# Patient Record
Sex: Female | Born: 1985
Health system: Southern US, Community
[De-identification: ages and names within clinical notes are randomized; demographics above are authoritative.]

## PROBLEM LIST (undated history)

## (undated) ENCOUNTER — Inpatient Hospital Stay (HOSPITAL_COMMUNITY): Payer: Self-pay

## (undated) DIAGNOSIS — Z8619 Personal history of other infectious and parasitic diseases: Secondary | ICD-10-CM

## (undated) DIAGNOSIS — L03213 Periorbital cellulitis: Secondary | ICD-10-CM

## (undated) DIAGNOSIS — I7300 Raynaud's syndrome without gangrene: Secondary | ICD-10-CM

## (undated) DIAGNOSIS — Z98891 History of uterine scar from previous surgery: Secondary | ICD-10-CM

## (undated) DIAGNOSIS — Z8679 Personal history of other diseases of the circulatory system: Secondary | ICD-10-CM

## (undated) DIAGNOSIS — M179 Osteoarthritis of knee, unspecified: Secondary | ICD-10-CM

## (undated) DIAGNOSIS — F419 Anxiety disorder, unspecified: Secondary | ICD-10-CM

## (undated) DIAGNOSIS — G8929 Other chronic pain: Secondary | ICD-10-CM

## (undated) DIAGNOSIS — N920 Excessive and frequent menstruation with regular cycle: Secondary | ICD-10-CM

## (undated) HISTORY — DX: Periorbital cellulitis: L03.213

## (undated) HISTORY — PX: HIP SURGERY: SHX245

## (undated) HISTORY — PX: TONSILLECTOMY: SUR1361

## (undated) HISTORY — DX: Personal history of other infectious and parasitic diseases: Z86.19

## (undated) HISTORY — DX: Anxiety disorder, unspecified: F41.9

## (undated) HISTORY — PX: KNEE SURGERY: SHX244

---
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
## 2007-01-09 ENCOUNTER — Ambulatory Visit: Payer: Self-pay | Admitting: Internal Medicine

## 2007-01-09 DIAGNOSIS — S83509A Sprain of unspecified cruciate ligament of unspecified knee, initial encounter: Secondary | ICD-10-CM

## 2007-01-09 LAB — CONVERTED CEMR LAB
Bilirubin Urine: NEGATIVE
Ketones, urine, test strip: NEGATIVE
pH: 5

## 2007-01-10 ENCOUNTER — Ambulatory Visit: Payer: Self-pay | Admitting: Internal Medicine

## 2007-01-10 ENCOUNTER — Encounter: Payer: Self-pay | Admitting: Internal Medicine

## 2007-01-10 LAB — CONVERTED CEMR LAB

## 2007-01-18 ENCOUNTER — Telehealth: Payer: Self-pay | Admitting: Internal Medicine

## 2007-02-20 ENCOUNTER — Telehealth: Payer: Self-pay | Admitting: Internal Medicine

## 2007-09-18 ENCOUNTER — Ambulatory Visit: Payer: Self-pay | Admitting: Internal Medicine

## 2007-09-18 LAB — CONVERTED CEMR LAB
ALT: 16 units/L (ref 0–35)
AST: 20 units/L (ref 0–37)
Alkaline Phosphatase: 47 units/L (ref 39–117)
Amylase: 131 units/L (ref 27–131)
Basophils Absolute: 0 10*3/uL (ref 0.0–0.1)
Basophils Relative: 0.6 % (ref 0.0–1.0)
Bilirubin Urine: NEGATIVE
Bilirubin, Direct: 0.1 mg/dL (ref 0.0–0.3)
Chloride: 106 meq/L (ref 96–112)
Eosinophils Absolute: 0.1 10*3/uL (ref 0.0–0.6)
Eosinophils Relative: 1.5 % (ref 0.0–5.0)
GFR calc Af Amer: 136 mL/min
Glucose, Urine, Semiquant: NEGATIVE
HCT: 40.4 % (ref 36.0–46.0)
Lymphocytes Relative: 32.8 % (ref 12.0–46.0)
MCHC: 33.3 g/dL (ref 30.0–36.0)
Monocytes Absolute: 0.5 10*3/uL (ref 0.2–0.7)
Neutro Abs: 4.6 10*3/uL (ref 1.4–7.7)
Neutrophils Relative %: 58.5 % (ref 43.0–77.0)
Nitrite: NEGATIVE
Protein, U semiquant: NEGATIVE
Sodium: 140 meq/L (ref 135–145)
Specific Gravity, Urine: <1.005
WBC: 7.8 10*3/uL (ref 4.5–10.5)
pH: 8

## 2007-09-19 ENCOUNTER — Encounter: Payer: Self-pay | Admitting: Internal Medicine

## 2007-09-21 ENCOUNTER — Encounter: Admission: RE | Admit: 2007-09-21 | Discharge: 2007-09-21 | Payer: Self-pay | Admitting: Internal Medicine

## 2007-09-24 ENCOUNTER — Telehealth (INDEPENDENT_AMBULATORY_CARE_PROVIDER_SITE_OTHER): Payer: Self-pay | Admitting: *Deleted

## 2008-04-14 ENCOUNTER — Ambulatory Visit: Payer: Self-pay | Admitting: Internal Medicine

## 2008-04-14 DIAGNOSIS — R51 Headache: Secondary | ICD-10-CM

## 2008-04-14 DIAGNOSIS — R519 Headache, unspecified: Secondary | ICD-10-CM | POA: Insufficient documentation

## 2008-04-21 LAB — CONVERTED CEMR LAB
BUN: 11 mg/dL (ref 6–23)
CO2: 28 meq/L (ref 19–32)
Calcium: 9.2 mg/dL (ref 8.4–10.5)
Chloride: 105 meq/L (ref 96–112)
Creatinine, Ser: 0.9 mg/dL (ref 0.4–1.2)
Eosinophils Relative: 2.2 % (ref 0.0–5.0)
GFR calc Af Amer: 101 mL/min
GFR calc non Af Amer: 83 mL/min
Glucose, Bld: 83 mg/dL (ref 70–99)
Hemoglobin: 13.4 g/dL (ref 12.0–15.0)
Lymphocytes Relative: 33.9 % (ref 12.0–46.0)
Neutrophils Relative %: 59.7 % (ref 43.0–77.0)
RBC: 4.5 M/uL (ref 3.87–5.11)
RDW: 11.7 % (ref 11.5–14.6)
Sed Rate: 18 mm/hr (ref 0–22)
Sodium: 140 meq/L (ref 135–145)
WBC: 9 10*3/uL (ref 4.5–10.5)

## 2008-07-16 ENCOUNTER — Ambulatory Visit: Payer: Self-pay | Admitting: Family Medicine

## 2008-07-16 DIAGNOSIS — J019 Acute sinusitis, unspecified: Secondary | ICD-10-CM | POA: Insufficient documentation

## 2008-09-18 ENCOUNTER — Ambulatory Visit (HOSPITAL_COMMUNITY): Admission: RE | Admit: 2008-09-18 | Discharge: 2008-09-18 | Payer: Self-pay | Admitting: Orthopedic Surgery

## 2008-10-02 ENCOUNTER — Ambulatory Visit (HOSPITAL_BASED_OUTPATIENT_CLINIC_OR_DEPARTMENT_OTHER): Admission: RE | Admit: 2008-10-02 | Discharge: 2008-10-03 | Payer: Self-pay | Admitting: Orthopedic Surgery

## 2008-10-02 HISTORY — PX: KNEE ARTHROSCOPY W/ MENISCECTOMY: SHX1879

## 2009-01-15 ENCOUNTER — Ambulatory Visit: Payer: Self-pay | Admitting: Family Medicine

## 2009-01-15 ENCOUNTER — Encounter (INDEPENDENT_AMBULATORY_CARE_PROVIDER_SITE_OTHER): Payer: Self-pay | Admitting: *Deleted

## 2009-01-15 DIAGNOSIS — F988 Other specified behavioral and emotional disorders with onset usually occurring in childhood and adolescence: Secondary | ICD-10-CM

## 2009-01-15 DIAGNOSIS — R109 Unspecified abdominal pain: Secondary | ICD-10-CM | POA: Insufficient documentation

## 2009-01-23 ENCOUNTER — Emergency Department (HOSPITAL_COMMUNITY): Admission: EM | Admit: 2009-01-23 | Discharge: 2009-01-23 | Payer: Self-pay | Admitting: Emergency Medicine

## 2009-06-08 ENCOUNTER — Ambulatory Visit: Payer: Self-pay | Admitting: Internal Medicine

## 2009-06-08 DIAGNOSIS — F411 Generalized anxiety disorder: Secondary | ICD-10-CM | POA: Insufficient documentation

## 2009-06-08 DIAGNOSIS — N390 Urinary tract infection, site not specified: Secondary | ICD-10-CM

## 2009-06-08 LAB — CONVERTED CEMR LAB
Bilirubin Urine: NEGATIVE
Nitrite: NEGATIVE
Specific Gravity, Urine: 1.015

## 2009-07-01 ENCOUNTER — Ambulatory Visit: Payer: Self-pay | Admitting: Family Medicine

## 2009-08-03 ENCOUNTER — Ambulatory Visit: Payer: Self-pay | Admitting: Family Medicine

## 2009-08-28 ENCOUNTER — Telehealth (INDEPENDENT_AMBULATORY_CARE_PROVIDER_SITE_OTHER): Payer: Self-pay | Admitting: *Deleted

## 2009-09-29 ENCOUNTER — Telehealth (INDEPENDENT_AMBULATORY_CARE_PROVIDER_SITE_OTHER): Payer: Self-pay | Admitting: *Deleted

## 2009-10-20 ENCOUNTER — Encounter: Admission: RE | Admit: 2009-10-20 | Discharge: 2009-10-20 | Payer: Self-pay | Admitting: Orthopedic Surgery

## 2009-11-02 ENCOUNTER — Telehealth (INDEPENDENT_AMBULATORY_CARE_PROVIDER_SITE_OTHER): Payer: Self-pay | Admitting: *Deleted

## 2009-12-14 ENCOUNTER — Telehealth (INDEPENDENT_AMBULATORY_CARE_PROVIDER_SITE_OTHER): Payer: Self-pay | Admitting: *Deleted

## 2010-01-05 ENCOUNTER — Telehealth (INDEPENDENT_AMBULATORY_CARE_PROVIDER_SITE_OTHER): Payer: Self-pay | Admitting: *Deleted

## 2010-02-18 ENCOUNTER — Telehealth: Payer: Self-pay | Admitting: Family Medicine

## 2010-02-23 ENCOUNTER — Telehealth: Payer: Self-pay | Admitting: Family Medicine

## 2010-02-26 ENCOUNTER — Ambulatory Visit: Payer: Self-pay | Admitting: Family Medicine

## 2010-02-26 DIAGNOSIS — H6520 Chronic serous otitis media, unspecified ear: Secondary | ICD-10-CM | POA: Insufficient documentation

## 2010-02-26 DIAGNOSIS — I7300 Raynaud's syndrome without gangrene: Secondary | ICD-10-CM | POA: Insufficient documentation

## 2010-03-02 LAB — CONVERTED CEMR LAB
ALT: 14 units/L (ref 0–35)
AST: 16 units/L (ref 0–37)
Albumin: 4.2 g/dL (ref 3.5–5.2)
Alkaline Phosphatase: 54 units/L (ref 39–117)
BUN: 7 mg/dL (ref 6–23)
Creatinine, Ser: 1 mg/dL (ref 0.40–1.20)
Eosinophils Absolute: 0.1 10*3/uL (ref 0.0–0.7)
Glucose, Bld: 108 mg/dL — ABNORMAL HIGH (ref 70–99)
Indirect Bilirubin: 0.3 mg/dL (ref 0.0–0.9)
Lymphocytes Relative: 40 % (ref 12–46)
MCHC: 32.9 g/dL (ref 30.0–36.0)
MCV: 87.4 fL (ref 78.0–100.0)
Monocytes Absolute: 0.6 10*3/uL (ref 0.1–1.0)
Neutrophils Relative %: 51 % (ref 43–77)
Platelets: 264 10*3/uL (ref 150–400)
RDW: 12.3 % (ref 11.5–15.5)
Rhuematoid fact SerPl-aCnc: <20 intl units/mL (ref 0–20)
Sed Rate: 6 mm/hr (ref 0–22)

## 2010-03-23 ENCOUNTER — Telehealth: Payer: Self-pay | Admitting: Family Medicine

## 2010-04-23 ENCOUNTER — Telehealth (INDEPENDENT_AMBULATORY_CARE_PROVIDER_SITE_OTHER): Payer: Self-pay | Admitting: *Deleted

## 2010-05-21 ENCOUNTER — Telehealth (INDEPENDENT_AMBULATORY_CARE_PROVIDER_SITE_OTHER): Payer: Self-pay | Admitting: *Deleted

## 2010-06-22 ENCOUNTER — Ambulatory Visit: Payer: Self-pay | Admitting: Family Medicine

## 2010-06-22 DIAGNOSIS — J069 Acute upper respiratory infection, unspecified: Secondary | ICD-10-CM | POA: Insufficient documentation

## 2010-06-24 ENCOUNTER — Telehealth: Payer: Self-pay | Admitting: Family Medicine

## 2010-07-27 ENCOUNTER — Telehealth (INDEPENDENT_AMBULATORY_CARE_PROVIDER_SITE_OTHER): Payer: Self-pay | Admitting: *Deleted

## 2010-08-12 NOTE — Progress Notes (Signed)
SummaryElmyra Stokes  REFILL THIS AFTERNOON  Phone Note Call from Patient Call back at Work Phone [phone]   Caller: Patient Summary of Call: NEEDS REFILL FOR VYVANCE---ONLY HAS ONE PILL LEFT---NEEDS PRESCRIPTION THIS AFTERNOON Initial call taken by: Jerolyn Shin,  April 23, 2010 8:58 AM  Follow-up for Phone Call        pt aware rx ready for pick-up.....................Marland KitchenFelecia Deloach CMA  April 23, 2010 9:05 AM     Prescriptions: VYVANSE 70 MG CAPS (LISDEXAMFETAMINE DIMESYLATE) 1 by mouth once daily  #30 x 0   Entered by:   Jeremy Johann CMA   Authorized by:   Neena Rhymes MD   Signed by:   Jeremy Johann CMA on 04/23/2010   Method used:   Print then Give to Patient   RxID:   [id number]

## 2010-08-12 NOTE — Progress Notes (Signed)
Summary: refill   Phone Note Refill Request   Refills Requested: Medication #1:  VYVANSE 70 MG CAPS 1 by mouth once daily  Note place on rx 6 month OV due............Marland KitchenFelecia Deloach CMA  May 21, 2010 11:02 AM      Prescriptions: VYVANSE 70 MG CAPS (LISDEXAMFETAMINE DIMESYLATE) 1 by mouth once daily  #30 x 0   Entered by:   Jeremy Johann CMA   Authorized by:   Neena Rhymes MD   Signed by:   Jeremy Johann CMA on 05/21/2010   Method used:   Print then Give to Patient   RxID:   [id number]

## 2010-08-12 NOTE — Assessment & Plan Note (Signed)
Summary: follow up med/cbs   Vital Signs:  Patient profile:   25 year old female Height:      62 inches Weight:      123.2 pounds BMI:     22.62 Temp:     99.3 degrees F oral Pulse rate:   72 / minute Pulse rhythm:   regular BP sitting:   110 / 72  (right arm) Cuff size:   regular  Vitals Entered By: Almeta Monas CMA Duncan Dull) (June 22, 2010 10:04 AM) CC: Med check F/u, URI symptoms   History of Present Illness: Pt here for refill vyvanse.  Pt is doing well in Law school.   No complaints except pt is suffering from bad cold.         This is a 25 year old woman who presents with URI symptoms.  The patient complains of nasal congestion and clear nasal discharge, but denies purulent nasal discharge, sore throat, dry cough, productive cough, earache, and sick contacts.  The patient denies fever, low-grade fever (<100.5 degrees), fever of 100.5-103 degrees, fever of 103.1-104 degrees, fever to >104 degrees, stiff neck, dyspnea, wheezing, rash, vomiting, diarrhea, use of an antipyretic, and response to antipyretic.  The patient also reports itchy throat.  The patient denies itchy watery eyes, sneezing, seasonal symptoms, response to antihistamine, headache, muscle aches, and severe fatigue.  The patient denies the following risk factors for Strep sinusitis: unilateral facial pain, unilateral nasal discharge, poor response to decongestant, double sickening, tooth pain, Strep exposure, tender adenopathy, and absence of cough.    Current Medications (verified): 1)  Loestrin 1/20 (21)   Tabs (Norethindrone Acet-Ethinyl Est Tabs) .... Take One Tablet Daily 2)  Alprazolam 0.5 Mg Tabs (Alprazolam) .... 1/2 To 1 By Mouth Once Daily As Needed Anxiety 3)  Vyvanse 70 Mg Caps (Lisdexamfetamine Dimesylate) .Marland Kitchen.. 1 By Mouth Once Daily 4)  Claritin 10 Mg Tabs (Loratadine) .Marland Kitchen.. 1 By Mouth Once Daily As Needed 5)  Sudafed 30 Mg Tabs (Pseudoephedrine Hcl) .Marland Kitchen.. 1 By Mouth Once Daily As Needed 6)  Nasonex 50  Mcg/act Susp (Mometasone Furoate) .... 2 Sprays Each Nostril Once Daily 7)  Astepro 0.15 % Soln (Azelastine Hcl) .... 2 Sprays Each Nostril Once Daily  Allergies (verified): No Known Drug Allergies  Past History:  Past Medical History: Last updated: 06/08/2009 G0P0 Headache (migrainex, Dx 2006) Anxiety  Past Surgical History: Last updated: 01/15/2009 Tonsillectomy Knee surgery L X 6- Dr Eulah Pont  Family History: Last updated: 01/15/2009 mother had problems with gallbladder, ADD  Social History: Last updated: 01/15/2009 Single Occupation: just graduated from [employer], going to [employer]  Risk Factors: Alcohol Use: 0 (01/15/2009) Exercise: no (01/15/2009)  Risk Factors: Smoking Status: never (01/15/2009) Passive Smoke Exposure: no (01/09/2007)  Family History: Reviewed history from 01/15/2009 and no changes required. mother had problems with gallbladder, ADD  Social History: Reviewed history from 01/15/2009 and no changes required. Single Occupation: just graduated from [employer], going to [employer]  Review of Systems      See HPI  Physical Exam  General:  Well-developed,well-nourished,in no acute distress; alert,appropriate and cooperative throughout examination Ears:  External ear exam shows no significant lesions or deformities.  Otoscopic examination reveals clear canals, tympanic membranes are intact bilaterally without bulging, retraction, inflammation or discharge. Hearing is grossly normal bilaterally. Nose:  no external deformity, mucosal erythema, and mucosal edema.   Mouth:  postnasal drip.   Neck:  No deformities, masses, or tenderness noted. Lungs:  Normal  respiratory effort, chest expands symmetrically. Lungs are clear to auscultation, no crackles or wheezes. Heart:  Normal rate and regular rhythm. S1 and S2 normal without gallop, murmur, click, rub or other extra sounds. Psych:  Cognition and judgment appear intact. Alert and  cooperative with normal attention span and concentration. No apparent delusions, illusions, hallucinations   Impression & Recommendations:  Problem # 1:  ADD (ICD-314.00) Assessment Improved refill vyvanse rto 6 months  Problem # 2:  URI (ICD-465.9) Assessment: New  Her updated medication list for this problem includes:    Claritin 10 Mg Tabs (Loratadine) .Marland Kitchen... 1 by mouth once daily as needed    con't mucinex use nasonex and astepro   Instructed on symptomatic treatment. Call if symptoms persist or worsen.   Complete Medication List: 1)  Loestrin 1/20 (21) Tabs (Norethindrone acet-ethinyl est tabs) .... Take one tablet daily 2)  Alprazolam 0.5 Mg Tabs (Alprazolam) .... 1/2 to 1 by mouth once daily as needed anxiety 3)  Vyvanse 70 Mg Caps (Lisdexamfetamine dimesylate) .Marland Kitchen.. 1 by mouth once daily 4)  Claritin 10 Mg Tabs (Loratadine) .Marland Kitchen.. 1 by mouth once daily as needed 5)  Sudafed 30 Mg Tabs (Pseudoephedrine hcl) .Marland Kitchen.. 1 by mouth once daily as needed 6)  Nasonex 50 Mcg/act Susp (Mometasone furoate) .... 2 sprays each nostril once daily 7)  Astepro 0.15 % Soln (Azelastine hcl) .... 2 sprays each nostril once daily  Patient Instructions: 1)  Please schedule a follow-up appointment in 6 months .  2)  Get plenty of rest, drink lots of clear liquids, and use Tylenol or Ibuprofen for fever and comfort. Return in 7-10 days if you're not better: sooner if you'er feeling worse.  Prescriptions: VYVANSE 70 MG CAPS (LISDEXAMFETAMINE DIMESYLATE) 1 by mouth once daily  #30 x 0   Entered and Authorized by:   Loreen Freud DO   Signed by:   Loreen Freud DO on 06/22/2010   Method used:   Print then Give to Patient   RxID:   [id number]    Orders Added: 1)  Est. Patient Level III [14782]

## 2010-08-12 NOTE — Progress Notes (Signed)
Summary: symptom worse  Phone Note Call from Patient Call back at Work Phone [phone]   Caller: Patient Summary of Call:  Symptoms not resolving with the suggested meds. Pt now c/o increase congestion in head and chest and coughing up green mucous. Pt use [facility].Pls advise..........Marland KitchenFelecia Deloach CMA  June 24, 2010 8:51 AM   Follow-up for Phone Call        augmentin 875mg   1 by mouth two times a day x10 days  Follow-up by: Loreen Freud DO,  June 24, 2010 9:06 AM  Additional Follow-up for Phone Call Additional follow up Details #1::        pt aware.... Almeta Monas CMA Duncan Dull)  June 24, 2010 9:16 AM     New/Updated Medications: AUGMENTIN 875-125 MG TABS (AMOXICILLIN-POT CLAVULANATE) 1 by mouth two times a day x10 days Prescriptions: AUGMENTIN 875-125 MG TABS (AMOXICILLIN-POT CLAVULANATE) 1 by mouth two times a day x10 days  #20 x 0   Entered by:   Almeta Monas CMA (AAMA)   Authorized by:   Loreen Freud DO   Signed by:   Almeta Monas CMA (AAMA) on 06/24/2010   Method used:   Electronically to        [facility]       [address]       [place], [place]  [address]       Ph: [phone]       Fax: [phone]   RxID:   [id number]

## 2010-08-12 NOTE — Assessment & Plan Note (Signed)
Summary: fu on meds/kdc   Vital Signs:  Patient profile:   25 year old female Weight:      133.38 pounds Temp:     98.3 degrees F oral Pulse rate:   82 / minute Pulse rhythm:   regular BP sitting:   122 / 80  (left arm) Cuff size:   regular  Vitals Entered By: Army Fossa CMA (August 03, 2009 4:19 PM) CC: follow up on meds   History of Present Illness: Pt here f/u add.  Pt doing well with 60 mg ---no complaints.  She is doing well in class with concentrating and tests.    Allergies (verified): No Known Drug Allergies  Past History:  Past medical, surgical, family and social histories (including risk factors) reviewed for relevance to current acute and chronic problems.  Past Medical History: Reviewed history from 06/08/2009 and no changes required. G0P0 Headache (migrainex, Dx 2006) Anxiety  Past Surgical History: Reviewed history from 01/15/2009 and no changes required. Tonsillectomy Knee surgery L X 6- Dr Eulah Pont  Family History: Reviewed history from 01/15/2009 and no changes required. mother had problems with gallbladder, ADD  Social History: Reviewed history from 01/15/2009 and no changes required. Single Occupation: just graduated from [employer], going to [employer]  Review of Systems      See HPI  Physical Exam  General:  Well-developed,well-nourished,in no acute distress; alert,appropriate and cooperative throughout examination Lungs:  Normal respiratory effort, chest expands symmetrically. Lungs are clear to auscultation, no crackles or wheezes. Heart:  Normal rate and regular rhythm. S1 and S2 normal without gallop, murmur, click, rub or other extra sounds. Psych:  Cognition and judgment appear intact. Alert and cooperative with normal attention span and concentration. No apparent delusions, illusions, hallucinations   Impression & Recommendations:  Problem # 1:  ADD (ICD-314.00) vyvanse 60 mg daily rto 6 months or sooner as needed    Complete Medication List: 1)  Loestrin 1/20 (21) Tabs (Norethindrone acet-ethinyl est tabs) .... Take one tablet daily 2)  Alprazolam 0.5 Mg Tabs (Alprazolam) .... 1/2 to 1 by mouth once daily as needed anxiety 3)  Vyvanse 60 Mg Caps (Lisdexamfetamine dimesylate) .Marland Kitchen.. 1 by mouth qam Prescriptions: VYVANSE 60 MG CAPS (LISDEXAMFETAMINE DIMESYLATE) 1 by mouth qam  #30 x 0   Entered and Authorized by:   Loreen Freud DO   Signed by:   Loreen Freud DO on 08/03/2009   Method used:   Print then Give to Patient   RxID:   [id number]

## 2010-08-12 NOTE — Progress Notes (Signed)
Summary: Vyvanse refill  Phone Note Refill Request   Patient father stopped by today to pick up refill from last week. It is time for refill, but appears to have not been printed.      Prescriptions: VYVANSE 60 MG CAPS (LISDEXAMFETAMINE DIMESYLATE) 1 by mouth qam  #30 x 0   Entered by:   Lucious Groves CMA   Authorized by:   Neena Rhymes MD   Signed by:   Lucious Groves CMA on 02/23/2010   Method used:   Print then Give to Patient   RxID:   [id number]

## 2010-08-12 NOTE — Progress Notes (Signed)
  Phone Note Refill Request   Refills Requested: Medication #1:  VYVANSE 60 MG CAPS 1 by mouth qam.  Follow-up for Phone Call        Pt is aware rx will be ready monday. Army Fossa CMA  August 28, 2009 4:18 PM     Prescriptions: VYVANSE 60 MG CAPS (LISDEXAMFETAMINE DIMESYLATE) 1 by mouth qam  #30 x 0   Entered by:   Army Fossa CMA   Authorized by:   Loreen Freud DO   Signed by:   Army Fossa CMA on 08/28/2009   Method used:   Printed then faxed to ...       [facility]       [address]       [place], [place]  [address]       Ph: [phone]       Fax: [phone]   RxID:   [id number]

## 2010-08-12 NOTE — Progress Notes (Signed)
Summary: vyvanse refill   Phone Note Refill Request Call back at Work Phone [phone] Message from:  Patient  Refills Requested: Medication #1:  VYVANSE 60 MG CAPS 1 by mouth qam.   Dosage confirmed as above?Dosage Confirmed   Supply Requested: 1 month   Last Refilled: 12/14/2009 PT IS REQUESTING RX EARLY. SHE WILL BE OUT OF TOWN FRIDAY AND WILL NOT BE BACK UNTIL JULY 12TH.   Method Requested: Pick up at Office Next Appointment Scheduled: NONE Initial call taken by: Lavell Islam,  January 05, 2010 10:35 AM  Follow-up for Phone Call        patient aware prescription ready for pick up.....Marland KitchenMarland KitchenDoristine Devoid  January 05, 2010 12:28 PM     Prescriptions: VYVANSE 60 MG CAPS (LISDEXAMFETAMINE DIMESYLATE) 1 by mouth qam  #30 x 0   Entered by:   Doristine Devoid   Authorized by:   Neena Rhymes MD   Signed by:   Doristine Devoid on 01/05/2010   Method used:   Print then Give to Patient   RxID:   [id number]

## 2010-08-12 NOTE — Progress Notes (Signed)
Summary: Vyvanse refill  Phone Note Refill Request Call back at Work Phone [phone] Message from:  Patient on July 27, 2010 11:39 AM  Refills Requested: Medication #1:  VYVANSE 70 MG CAPS 1 by mouth once daily Patient needs Vyvansel prescription refilled.  Advised patient that it will be ready in 24 hours for pickup unless someone calls them  Next Appointment Scheduled: none Initial call taken by: Jerolyn Shin,  July 27, 2010 11:39 AM    Prescriptions: VYVANSE 70 MG CAPS (LISDEXAMFETAMINE DIMESYLATE) 1 by mouth once daily  #30 x 0   Entered by:   Doristine Devoid CMA   Authorized by:   Neena Rhymes MD   Signed by:   Doristine Devoid CMA on 07/27/2010   Method used:   Print then Give to Patient   RxID:   [id number]

## 2010-08-12 NOTE — Assessment & Plan Note (Signed)
Summary: EAR PROBLEM/KN   Vital Signs:  Patient profile:   25 year old female Height:      62 inches Weight:      121 pounds Temp:     97.5 degrees F oral Pulse rate:   80 / minute BP sitting:   120 / 80  (left arm)  Vitals Entered By: Jeremy Johann CMA (February 26, 2010 4:06 PM) CC: trouble hearing, circulation issue, Ear pain   History of Present Illness: Pt here c/o feet and hands turning blue and going numb for a few years but has progressively gotten worse since april. No muscle aches or joint pains.  No sob or cp.  It is worse when cold butcan happen anytime---if she props her legs up and wraps up in a blanket they get better.       Ear Pain      This is a 25 year old woman who presents with Ear pain.  The symptoms began 4 weeks ago.  The patient complains of sensation of fullness, hearing loss, and nasal discharge, but denies ear discharge, tinnitus, fever, and jaw click.  The pain is located in both ears.  The pain is described as constant.  Associated symptoms include headache, popping or crackling sounds, and pressure.  Prior treatment has included decongestants and claritin with no relief.    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  Current Medications (verified): 1)  Loestrin 1/20 (21)   Tabs (Norethindrone Acet-Ethinyl Est Tabs) .... Take One Tablet Daily 2)  Alprazolam 0.5 Mg Tabs (Alprazolam) .... 1/2 To 1 By Mouth Once Daily As Needed Anxiety 3)  Vyvanse 60 Mg Caps (Lisdexamfetamine Dimesylate) .Marland Kitchen.. 1 By Mouth Qam  Allergies (verified): No Known Drug Allergies  Past History:  Past Medical History: Last updated: 06/08/2009 G0P0 Headache (migrainex, Dx 2006) Anxiety  Past Surgical History: Last updated: 01/15/2009 Tonsillectomy Knee surgery L X 6- Dr Eulah Pont  Family History: Last updated: 01/15/2009 mother had problems with gallbladder, ADD  Social History: Last updated: 01/15/2009 Single Occupation: just graduated from [employer], going to [employer]  Risk Factors: Alcohol Use: 0 (01/15/2009) Exercise: no (01/15/2009)  Risk Factors: Smoking Status: never (01/15/2009) Passive Smoke Exposure: no (01/09/2007)  Family History: Reviewed history from 01/15/2009 and no changes required. mother had problems with gallbladder, ADD  Social History: Reviewed history from 01/15/2009 and no changes required. Single Occupation: just graduated from [employer], going to [employer]  Review of Systems      See HPI  Physical Exam  General:  Well-developed,well-nourished,in no acute distress; alert,appropriate and cooperative throughout examination Ears:  External ear exam shows no significant lesions or deformities.  Otoscopic examination reveals clear canals, tympanic membranes are intact bilaterally without bulging, retraction, inflammation or discharge.  Nose:  no external deformity and mucosal erythema.   Mouth:  Oral mucosa and oropharynx without lesions or exudates.  Teeth in good repair. Neck:  No deformities, masses, or tenderness noted. Lungs:  Normal respiratory effort, chest expands symmetrically. Lungs are clear to auscultation, no crackles or wheezes. Heart:  normal rate and no murmur.   Msk:  No deformity or scoliosis noted of thoracic or lumbar spine.   Pulses:  R radial normal, R posterior tibial normal, R dorsalis pedis normal, L radial normal, L posterior tibial normal, and L dorsalis pedis normal.   Extremities:  No clubbing, cyanosis, edema, or deformity noted with normal full range of motion  of all joints.   Skin:  Intact without suspicious lesions or rashes Cervical Nodes:  No lymphadenopathy noted Psych:  Cognition and judgment appear intact. Alert and cooperative with normal attention span and concentration. 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) keep hands and feet warm consider  rheum referral if any pain develops rto Orders: Venipuncture ([address]) TLB-B12 + Folate Pnl (60454_09811-B14/NWG) TLB-BMP (Basic Metabolic Panel-BMET) (80048-METABOL) TLB-CBC Platelet - w/Differential (85025-CBCD) TLB-Hepatic/Liver Function Pnl (80076-HEPATIC) TLB-TSH (Thyroid Stimulating Hormone) (84443-TSH) T-Antinuclear Antib (ANA) (95621-30865) Specimen Handling (78469)  Problem # 2:  OTITIS MEDIA, SEROUS, CHRONIC (ICD-381.10) claritin sudafed veramyst if no better in 10-14 days----consider prednisone taper vs ENT  Problem # 3:  ADD (ICD-314.00) cont vyvanse  Complete Medication List: 1)  Loestrin 1/20 (21) Tabs (Norethindrone acet-ethinyl est tabs) .... Take one tablet daily 2)  Alprazolam 0.5 Mg Tabs (Alprazolam) .... 1/2 to 1 by mouth once daily as needed anxiety 3)  Vyvanse 60 Mg Caps (Lisdexamfetamine dimesylate) .Marland Kitchen.. 1 by mouth qam 4)  Veramyst 27.5 Mcg/spray Susp (Fluticasone furoate) .... 2 sprays each nostril once daily 5)  Claritin 10 Mg Tabs (Loratadine) .Marland Kitchen.. 1 by mouth once daily as needed 6)  Sudafed 30 Mg Tabs (Pseudoephedrine hcl) .Marland Kitchen.. 1 by mouth once daily as needed  Patient Instructions: 1)  If pain develops rto  2)  rto 6 months for add f/u

## 2010-08-12 NOTE — Progress Notes (Signed)
Summary: increase dose  Phone Note Refill Request Call back at Work Phone [phone] Message from:  Patient on March 23, 2010 9:51 AM  Refills Requested: Medication #1:  VYVANSE 60 MG CAPS 1 by mouth qam patient would like to know if she could dose increase she has more classes and they are longer  Initial call taken by: Doristine Devoid CMA,  March 23, 2010 9:51 AM  Follow-up for Phone Call        vyvanse 70 mg #30 1 by mouth once daily ---that is the highest dose Follow-up by: Loreen Freud DO,  March 23, 2010 11:04 AM  Additional Follow-up for Phone Call Additional follow up Details #1::        rx printed. Pt aware Additional Follow-up by: Almeta Monas CMA Duncan Dull),  March 23, 2010 11:55 AM    New/Updated Medications: VYVANSE 70 MG CAPS (LISDEXAMFETAMINE DIMESYLATE) 1 by mouth once daily Prescriptions: VYVANSE 70 MG CAPS (LISDEXAMFETAMINE DIMESYLATE) 1 by mouth once daily  #30 x 0   Entered by:   Almeta Monas CMA (AAMA)   Authorized by:   Loreen Freud DO   Signed by:   Almeta Monas CMA (AAMA) on 03/23/2010   Method used:   Print then Give to Patient   RxID:   [id number]

## 2010-08-12 NOTE — Progress Notes (Signed)
Summary: refill  Phone Note Refill Request Call back at Work Phone [phone] Message from:  Patient on December 14, 2009 10:31 AM  Refills Requested: Medication #1:  VYVANSE 60 MG CAPS 1 by mouth qam. CALL PATIENT WHEN READY   Method Requested: Pick up at Office Initial call taken by: Okey Regal Spring,  December 14, 2009 10:31 AM  Follow-up for Phone Call        left message for pt that rx was ready. Army Fossa CMA  December 14, 2009 11:46 AM     Prescriptions: VYVANSE 60 MG CAPS (LISDEXAMFETAMINE DIMESYLATE) 1 by mouth qam  #30 x 0   Entered by:   Army Fossa CMA   Authorized by:   Loreen Freud DO   Signed by:   Army Fossa CMA on 12/14/2009   Method used:   Print then Give to Patient   RxID:   [id number]

## 2010-08-12 NOTE — Progress Notes (Signed)
Summary: REFILL REQUEST  Phone Note Refill Request Message from:  Patient on February 18, 2010 12:46 PM  Refills Requested: Medication #1:  VYVANSE 60 MG CAPS 1 by mouth qam.   Dosage confirmed as above?Dosage Confirmed   Supply Requested: 1 month PT AWARE READY FOR PICKUP WITHIN NEXT 24 HRS   Method Requested: Pick up at Office Next Appointment Scheduled: 02/26/10 Initial call taken by: Lavell Islam,  February 18, 2010 12:47 PM

## 2010-08-12 NOTE — Progress Notes (Signed)
Summary: refill  Phone Note Refill Request Call back at Work Phone [phone] Message from:  Patient on November 02, 2009 1:59 PM  Refills Requested: Medication #1:  VYVANSE 60 MG CAPS 1 by mouth qam. call patient when ready  Next Appointment Scheduled: none Initial call taken by: Okey Regal Spring,  November 02, 2009 1:59 PM  Follow-up for Phone Call        Pt aware rx is ready. Army Fossa CMA  November 02, 2009 2:04 PM     Prescriptions: VYVANSE 60 MG CAPS (LISDEXAMFETAMINE DIMESYLATE) 1 by mouth qam  #30 x 0   Entered by:   Army Fossa CMA   Authorized by:   Loreen Freud DO   Signed by:   Army Fossa CMA on 11/02/2009   Method used:   Print then Give to Patient   RxID:   [id number]

## 2010-08-12 NOTE — Progress Notes (Signed)
  Phone Note Refill Request   Refills Requested: Medication #1:  VYVANSE 60 MG CAPS 1 by mouth qam.    Prescriptions: VYVANSE 60 MG CAPS (LISDEXAMFETAMINE DIMESYLATE) 1 by mouth qam  #30 x 0   Entered by:   Army Fossa CMA   Authorized by:   Loreen Freud DO   Signed by:   Army Fossa CMA on 09/29/2009   Method used:   Print then Give to Patient   RxID:   [id number]

## 2010-08-23 ENCOUNTER — Telehealth (INDEPENDENT_AMBULATORY_CARE_PROVIDER_SITE_OTHER): Payer: Self-pay | Admitting: *Deleted

## 2010-09-01 NOTE — Progress Notes (Signed)
Summary: refill  Phone Note Refill Request Message from:  Patient  Refills Requested: Medication #1:  VYVANSE 70 MG CAPS 1 by mouth once daily patient will pick up [id number] around noon  Initial call taken by: Okey Regal Spring,  August 23, 2010 4:49 PM    Prescriptions: VYVANSE 70 MG CAPS (LISDEXAMFETAMINE DIMESYLATE) 1 by mouth once daily  #30 x 0   Entered by:   Doristine Devoid CMA   Authorized by:   Neena Rhymes MD   Signed by:   Doristine Devoid CMA on 08/24/2010   Method used:   Print then Give to Patient   RxID:   [id number]

## 2010-09-20 ENCOUNTER — Telehealth: Payer: Self-pay | Admitting: Family Medicine

## 2010-09-24 ENCOUNTER — Telehealth: Payer: Self-pay | Admitting: Family Medicine

## 2010-09-28 NOTE — Progress Notes (Signed)
Summary: Vyvanse refill  Phone Note Refill Request Message from:  Patient on September 20, 2010 2:49 PM  Refills Requested: Medication #1:  VYVANSE 70 MG CAPS 1 by mouth once daily Patient needs Vyvanse prescription refilled.  Advised patient that it will be ready in 24 hours for pickup unless someone calls them  Initial call taken by: Jerolyn Shin,  September 20, 2010 2:49 PM  Follow-up for Phone Call        ok for #30, no refills Follow-up by: Neena Rhymes MD,  September 20, 2010 4:01 PM    Prescriptions: VYVANSE 70 MG CAPS (LISDEXAMFETAMINE DIMESYLATE) 1 by mouth once daily  #30 x 0   Entered by:   Lucious Groves CMA   Authorized by:   Neena Rhymes MD   Signed by:   Lucious Groves CMA on 09/20/2010   Method used:   Print then Give to Patient   RxID:   [id number]

## 2010-10-07 NOTE — Progress Notes (Signed)
Summary: Alprazolam refill  Phone Note Refill Request Call back at Work Phone [phone] Message from:  Patient on September 24, 2010 10:05 AM  Refills Requested: Medication #1:  ALPRAZOLAM 0.5 MG TABS 1/2 to 1 by mouth once daily as needed anxiety   Dosage confirmed as above?Dosage Confirmed [facility],  [address]ani Files, [place]       Next Appointment Scheduled: none Initial call taken by: Jerolyn Shin,  September 24, 2010 10:06 AM  Follow-up for Phone Call        Last filled with [facility] 11/10. Lucious Groves CMA  September 24, 2010 10:18 AM   Additional Follow-up for Phone Call Additional follow up Details #1::        ok for #30, no refills Additional Follow-up by: Neena Rhymes MD,  September 24, 2010 11:48 AM    Additional Follow-up for Phone Call Additional follow up Details #2::    RX was faxed to [facility] on [address]. Left message on voicemail to call back to office. Lucious Groves CMA  September 24, 2010 12:37 PM   No return call, left message on voicemail that prescription was sent to [facility]. Lucious Groves CMA  September 27, 2010 8:50 AM   Prescriptions: ALPRAZOLAM 0.5 MG TABS (ALPRAZOLAM) 1/2 to 1 by mouth once daily as needed anxiety  #30 x 0   Entered by:   Lucious Groves CMA   Authorized by:   Neena Rhymes MD   Signed by:   Lucious Groves CMA on 09/24/2010   Method used:   Printed then faxed to ...       [facility]       [address]       [place], [place]  [address]       Ph: [phone]       Fax: [phone]   RxID:   [id number]

## 2010-10-17 LAB — COMPREHENSIVE METABOLIC PANEL
AST: 22 U/L (ref 0–37)
BUN: 6 mg/dL (ref 6–23)
Creatinine, Ser: 0.84 mg/dL (ref 0.4–1.2)
GFR calc non Af Amer: >60 mL/min (ref >60)
Glucose, Bld: 83 mg/dL (ref 70–99)
Sodium: 137 mEq/L (ref 135–145)
Total Bilirubin: 0.4 mg/dL (ref 0.3–1.2)

## 2010-10-17 LAB — URINALYSIS, ROUTINE W REFLEX MICROSCOPIC
Bilirubin Urine: NEGATIVE
Glucose, UA: NEGATIVE mg/dL
Hgb urine dipstick: NEGATIVE
Ketones, ur: 40 mg/dL — AB
Nitrite: NEGATIVE
Urobilinogen, UA: 0.2 mg/dL (ref 0.0–1.0)

## 2010-10-17 LAB — CBC
HCT: 38.3 % (ref 36.0–46.0)
RDW: 11.7 % (ref 11.5–15.5)

## 2010-10-17 LAB — DIFFERENTIAL
Basophils Relative: 0 % (ref 0–1)
Lymphs Abs: 3.6 10*3/uL (ref 0.7–4.0)
Monocytes Relative: 6 % (ref 3–12)
Neutro Abs: 7.9 10*3/uL — ABNORMAL HIGH (ref 1.7–7.7)
Neutrophils Relative %: 63 % (ref 43–77)

## 2010-10-21 LAB — ANAEROBIC CULTURE: Gram Stain: NONE SEEN

## 2010-10-21 LAB — WOUND CULTURE: Gram Stain: NONE SEEN

## 2010-10-21 LAB — POCT HEMOGLOBIN-HEMACUE: Hemoglobin: 12.6 g/dL (ref 12.0–15.0)

## 2010-10-25 ENCOUNTER — Other Ambulatory Visit: Payer: Self-pay | Admitting: Family Medicine

## 2010-10-25 MED ORDER — LISDEXAMFETAMINE DIMESYLATE 70 MG PO CAPS: 70.0000 mg | ORAL_CAPSULE | Freq: Every day | ORAL | Status: DC

## 2010-10-25 NOTE — Telephone Encounter (Signed)
Pt is not yet due for follow up and rx was last done 09/20/10, ok to refill?

## 2010-10-25 NOTE — Telephone Encounter (Signed)
Needs prescription refill for Vyvance---advised her it would be ready after 10am Tuesday 4/17 unless she got a phone call

## 2010-10-25 NOTE — Telephone Encounter (Signed)
Put up front for pick up this morning.

## 2010-11-23 NOTE — Op Note (Signed)
NAME:  APPLE, Etherine             ACCOUNT NO.:  [id number]   MEDICAL RECORD NO.:  [id number]          PATIENT TYPE:  AMB   LOCATION:  DSC                          FACILITY:  [facility]   PHYSICIAN:  Loreta Ave, M.D. DATE OF BIRTH:  1985-12-18   DATE OF PROCEDURE:  10/02/2008  DATE OF DISCHARGE:                               OPERATIVE REPORT   PREOPERATIVE DIAGNOSES:  Left knee proximal tibial reactive ganglion  cyst in tibial tunnel with retained BioScrew in the tunnel and a distal  metallic post screw.  Previous allograft anterior cruciate ligament  reconstruction with question disruption of graft.   POSTOPERATIVE DIAGNOSES:  Left knee proximal tibial reactive ganglion  cyst in tibial tunnel with retained BioScrew in the tunnel and a distal  metallic post screw.  Previous allograft anterior cruciate ligament  reconstruction without significant graft disruption.  Also, radial tear  in the midportion of medial meniscus.   PROCEDURE:  Left knee exam under anesthesia, arthroscopy, assessment of  graft with superficial debridement.  Partial medial meniscectomy.  Open  complete curettage and then allograft treatment of the proximal tibial  cyst with removal of the BioScrew and metallic screw as well as  remaining FiberWire.  Cancellous chip for allograft.   SURGEON:  Loreta Ave, MD   ASSISTANT:  Genene Churn. Barry Dienes, [place], present throughout the entire case and  necessary for timely completion of procedure.   ANESTHESIA:  General.   BLOOD LOSS:  Minimal.   TOURNIQUET TIME:  1 hour.   SPECIMENS:  None.   CULTURES:  The ganglion fluid was sent for aerobic and anaerobic  culture.   DRESSING:  Soft compressive with knee immobilizer.   PROCEDURE:  The patient brought to the operating room and after adequate  anesthesia had been obtained, knee examined.  Although some increased  excursion Lachman and drawer, she still had a good endpoint.  No rotary  instability.  Collaterals  stable.  A little swelling over the base of  the tibial tunnel in the incision there.  I could bring it through full  motion.  Tourniquet applied.  Prepped and draped in the usual sterile  fashion.  Exsanguinated with elevation and Esmarch.  Tourniquet inflated  to 300 mmHg.  Anteromedial and anterolateral portals for arthroscopy.  Arthroscope was introduced and the knee inspected.  Remarkably little  chondral change in all 3 compartments.  Good patellofemoral tracking.  Lateral meniscus and lateral compartment normal.  Medial meniscus  previous partial medial meniscectomy.  Some new radial tearing in the  middle third, saucerized out,  tapered in smoothly.  The ACL graft had  some fraying and attenuation.  Superficial debridement.  Despite the  changes in the bone below this, there still appeared to be well  anchored, had a good endpoint, still solid, still preventing increased  motion, and providing some stability.  Arthroscope was then removed.  The incision over the tibial tunnel was opened and the scar excised.  Extended proximally and distally.  Medially, entering a ganglion cyst  emanating from the tibia and extending out.  Typical ganglion fluid.  Cultures were  sent, but there was no obvious purulence.  Sutures over  the post screw were evident.  These were freed up and when they were  pulled on, it did not appear that they were communicating with the  graft.  I put the  arthroscope in just to be sure.  I then removed the  metallic screw as well as BioScrew.  The cyst was then completely  curetted out.  All debris from the graft, sutures, and screw removed.  This went up to the base of the ACL but not penetrating really into the  joint and no ganglion fluid was extending into the joint.  After  completely curetting this out, using fluoroscopic guidance, it was  freshened to bleeding bone throughout with a bur and curette.  It was  then packed with cancellous graft utilizing 20 mL  of cancellous graft.  Fluoroscopy was used to confirm that I had filled in the entire cyst  impacted.  The arthroscope had been left in place during that and none  of the graft was put up into the knee.  Further after the graft was  completely filling the cyst, the graft of the ACL was still intact.  Good endpoint and functional.  I did not feel excision was indicated.  Instruments and fluid were removed from the knee.  The portals closed  with nylon.  The incision was then thoroughly irrigated and closed with  Vicryl subcutaneous and subcuticular.  Sterile compressive dressing  applied.  Tourniquet deflated and removed.  Knee immobilizer applied.  Anesthesia reversed.  Brought to recovery room.  Tolerated the surgery  well.  No complications.       Loreta Ave, M.D.  Electronically Signed     DFM/MEDQ  D:  10/02/2008  T:  10/03/2008  Job:  [id number]

## 2010-11-25 ENCOUNTER — Telehealth: Payer: Self-pay | Admitting: Family Medicine

## 2010-11-25 MED ORDER — LISDEXAMFETAMINE DIMESYLATE 70 MG PO CAPS: 70.0000 mg | ORAL_CAPSULE | ORAL | Status: DC

## 2010-11-25 NOTE — Telephone Encounter (Signed)
VM left making patient aware Rx ready for pick up.    KP 

## 2010-11-25 NOTE — Telephone Encounter (Signed)
Needs refill for Vyvanse---advised that it would be ready after 3 tomorrow unless she got a call

## 2010-11-26 NOTE — Op Note (Signed)
NAME:  Sarah Stokes, Sarah Stokes             ACCOUNT NO.:  [id number]   MEDICAL RECORD NO.:  [id number]          PATIENT TYPE:  AMB   LOCATION:  DSC                          FACILITY:  [facility]   PHYSICIAN:  Loreta Ave, M.D. DATE OF BIRTH:  [birth year]   DATE OF PROCEDURE:  11/17/2004  DATE OF DISCHARGE:                                 OPERATIVE REPORT   PREOPERATIVE DIAGNOSIS:  Left knee status post near dislocation with  previous open repair of lateral collateral ligament, biceps tendon avulsion,  arcuate ligament complex, status post autograft anterior cruciate ligament  reconstruction patellar tendon, bone-tendon-bone autograft, residual  straight lateral instability with attenuation of the lateral collateral  ligament repair.   POSTOPERATIVE DIAGNOSIS:  Left knee status post near dislocation with  previous open repair of lateral collateral ligament, biceps tendon avulsion,  arcuate ligament complex, status post autograft anterior cruciate ligament  reconstruction patellar tendon, bone-tendon-bone autograft, residual  straight lateral instability with attenuation of the lateral collateral  ligament repair, with frayed tearing lateral meniscus, little chondromalacia  lateral tibial plateau, functional lateral collateral ligament, but marked  stretching and attenuation at the site of reattachment to the fibular head.   PROCEDURE:  Left knee exam under anesthesia, arthroscopy, debridement of  lateral tibial plateau, lateral meniscus, open inspection of the lateral  structures with detachment lateral collateral ligament at the site of  attenuation fibular head, reconstruction by recent reimplantation of lateral  collateral ligament into the fibular head augmented with two #2 fiber wires  and a 6.5 mm bioabsorbable corkscrew.  Also, inspection, protection,  decompression peroneal nerve.   SURGEON:  Loreta Ave, M.D.   ASSISTANT:  Genene Churn. Denton Meek.   ANESTHESIA:  General.   BLOOD LOSS:  Minimal.   TOURNIQUET TIME:  2 hours.   SPECIMENS:  None.   CULTURES:  None.   COMPLICATIONS:  None.   DRESSINGS:  Soft compressive, with a Bledsoe brace locked at 30 degrees.   PROCEDURE:  The patient was brought to the operating room, and after  adequate anesthesia had been obtained, left knee examined.  Full motion.  Slight hyperextension matching the other knee.  All ligaments stable except  for the LCL.  Opens a little bit in full extension to varus stress and much  more dramatically in slight flexion to varus stress, quite a bit different  than the opposite side.  No instability posterolaterally.  No posterior sag.  Negative Lachman, negative drawer, as well as negative posterior drawer.  Negative pivot shift.  Negative stress MCL ligament.  Tourniquet applied.  Prepped and draped in the usual sterile fashion.  Exsanguinated with  elevation and Esmarch.  Tourniquet inflated to 200 mmHg.  Two portals,  anterolateral and anteromedial.  Arthroscope introduced.  Knee inspected.  A  little roughening of the lateral tibial plateau debrided.  Scar tissue  anteromedially resected.  Good patellofemoral tracking.  ACL reconstruction  was intact and functional.  A little scarring next to that that was  debrided, but the remaining reconstruction looked excellent.  PCL intact.  Laterally, popliteal tendon intact behind the lateral meniscus.  Some  fraying centrally which was debrided, retaining a fair amount of meniscus  after completion.  Some grade 2 fraying lateral tibial plateau debrided.  Instruments and fluid removed.  Lateral incision was opened at the posterior  aspect of the iliotibial band, extending it slightly distally.  Skin and  subcutaneous tissues divided.  Iliotibial band and biceps interval was  opened, retracting the iliotibial band anteriorly.  The biceps band was  still intact and functional on the fibular head.  With careful dissection,  the peroneal  nerve was identified and protected throughout.  Lateral  collateral ligament was still a stout functional ligament from the femoral  attachment all the way down to just proximal to the fibular head, where the  repair at that site had markedly stretched and attenuated, no longer  providing significant lateral stability.  The ligament was carefully  dissected out, maintaining the femoral attachment.  It was then thoroughly  tested and was very intact and functional so that I could do reconstruction  with the native ligament rather than to add allograft.  Fibular head was  opened with a drill hole to allow reimplantation.  The ligament itself was  captured with a weaved fiber wire suture.  I initially tried to reimplant it  by taking the sutures in the drill hole and then out through the lateral  side of the fibula, but this did not give me a firm repair.  During that,  the peroneal nerve was identified and never encroached upon.  I therefore  changed the fixation to implanting a 6.5 mm bioabsorbable corkscrew down  into the fibula, which was excellent capture and fixation.  Sutures in the  ligament were then tied down to the sutures in the anchor, pulling the  lateral collateral into the end of the fibula, restoring excellent  continuity and stability, even through full motion.  It was tied down with  the knee flexed, and then I used a second fiber wire from the anchor to  weave up into the lateral collateral ligament to further reinforce the  attachment and repair.  Once that was complete, I had excellent restoration  of stability.  I was able to get her to full extension without too much  tension on the repaired reconstructed ligament, but I had excellent  stability in both flexion and extension of all ligaments.  The wound was  irrigated.  Biceps was then brought up to the posterior margin of the LCL distally and the capsule brought to the anterior margin to further oversew  the site of  repair and reattachment.  Peroneal nerve identified at the  completion and was intact.  The iliotibial band and the anterior margin of  the biceps was then closed with 0 Vicryl.  Skin and subcutaneous with  subcutaneous and subcuticular Vicryl.  The scar had been resected, so we had  a nice plastic surgery closure.  Portals were closed with nylon.  Margins of  the wound were injected with Marcaine.  Tourniquet had been deflated.  Sterile compressive dressing applied.  Bledsoe brace applied, locked at 30  degrees.  Anesthesia reversed.  Brought to the recovery room.  Tolerated  surgery well.  No complications.      DFM/MEDQ  D:  11/17/2004  T:  11/17/2004  Job:  [id number]

## 2010-11-26 NOTE — Op Note (Signed)
NAME:  Sarah Stokes, Sarah Stokes                         ACCOUNT NO.:  [id number]   MEDICAL RECORD NO.:  [id number]                   PATIENT TYPE:  AMB   LOCATION:  DSC                                  FACILITY:  [facility]   PHYSICIAN:  Loreta Ave, M.D.              DATE OF BIRTH:  Sep 02, 1985   DATE OF PROCEDURE:  10/15/2003  DATE OF DISCHARGE:                                 OPERATIVE REPORT   PREOPERATIVE DIAGNOSIS:  Left knee complete anterior cruciate ligament tear  with anterolateral rotary instability.  Complete tear lateral collateral  ligament off fibular head with tearing of conjoined tendon off fibular head.   POSTOPERATIVE DIAGNOSIS:  Left knee complete anterior cruciate ligament tear  with anterolateral rotary instability.  Complete tear lateral collateral  ligament off fibular head with tearing of conjoined tendon off fibular head  with significant anterior and lateral instability.  Posterior horn lateral  meniscal tear.   OPERATION PERFORMED:  Left knee examination under anesthesia.  Open  exploration and primary repair of the lateral collateral ligament and  conjoined tendon to fibular head with a bioabsorbable 5 mm anchor in the  fibular head and FiberWire suture.  Arthroscopy with debridement of lateral  meniscal tear.  Arthroscopic endoscopic anterior cruciate ligament  reconstruction, patellar tendon autograft bone-tendon-bone with  bioabsorbable screw fixation and notchplasty.   SURGEON:  Loreta Ave, M.D.   ASSISTANT:  Arlys John D. Petrarca, P.A.-C.   ANESTHESIA:  General.   ESTIMATED BLOOD LOSS:  Minimal.   TOURNIQUET TIME:  One hour 30 minutes.   SPECIMENS:  None.   CULTURES:  None.   COMPLICATIONS:  None.   DRESSING:  Soft compressive with Bledsoe brace locked at 30 degrees.   DESCRIPTION OF PROCEDURE:  The patient was brought to the operating room and  after adequate anesthesia had been obtained, the left knee examined.  Positive Lachman, positive  drawer, positive pivot shift.  Grossly unstable  laterally, opening in varus stress in both full extension and flexion. MCL  and PCL appeared to be stable.  Full passive motion.  Tourniquet applied and  prepped and draped in the usual sterile fashion.  Exsanguinated with  elevation Esmarch.  Tourniquet inflated to 300 mmHg.  Longitudinal incision  just above the knee laterally extending down over the anterior aspect of the  fibula.  Skin and subcutaneous tissue divided.  Fascia opened.  Injury was  recognized.  The entire conjoined tendon and lateral collateral ligament  were avulsed off the fibula.  Tearing extended over into the arcuate  ligament complex behind this but that was still intact as it went further  behind the knee.  Popliteal tendon appeared to be intact.  Wound thoroughly  irrigated. All structures clearly identified.  A #2 FiberWire suture was  weaved in a lateral collateral ligament and another one weaved up into the  conjoined tendon.  A 5 mm bioabsorbable anchor was then  placed in the head  of the fibula and sutures brought into approximation.  These were left  untied at that point, attention being turned to the rest of the knee.  Throughout the entire exposure laterally, care taken to protect and not  injure the peroneal nerve.  I did not dissect and open this but we just  stayed away from that region.  Three portals were created in the knee, one  superolateral, one each medial and lateral parapatellar.  Inflow catheter  introduced.  Knee distended running at a low fluid pressure to prevent  extravasation of fluid out laterally.  Arthroscope introduced.  Knee  inspected.  Other than a little chondral indentation medial femoral condyle,  articular cartilage looked good.  Patellofemoral joint good tracking.  Complete midsubstance irreparable tear anterior cruciate ligament that was  debrided.  Reasonable notch that was able to be opened a little bit further  with a  notchplasty with a shaver and high speed bur.  PCL intact.  Medial  meniscus looked good.  Lateral meniscus had a little frayed tearing at the  posterior attachment debrided with a shaver.  Instruments fluid removed.  Anterior incision from patella to tibial tubercle, harvesting middle third  patellar tendon with 9 mm parallel knife and bone pegs at either end.  That  wound was irrigated and the defect in the tendon closed with Vicryl.  Graft  prepared for 9 mm tunnels.  Arthroscope reintroduced.  Small incision made  medial to the tibial tubercle and K-wire driven from there out into the foot  print of the ACL with the appropriate tibial guide.  Overdrilled with a 9 mm  reamer.  Debris cleared with a shaver.  Femoral guide inserted across tibial  tunnel notch back cortex femur.  K-wire driven and then overdrilled with a 9  mm reamer for appropriate depth of the graft and pegs.  All debris cleared  from the knee.  All structures thoroughly assessed.  Tunnel was assessed and  found to be in position.  Two pin passer inserted across both tunnels and  out through a stab wound in the anterolateral thigh. Nitinol wire brought  out the medial portal and out to the femoral tunnel. Graft attached to two-  pin passer and pulled in across the knee seating the pegs well in the tibial  and femoral tunnels.  Fixed in the femoral tunnel over a Nitinol wire with  an 8 x 25 bioabsorbable screw.  Excellent capturing and fixation.  Knee  placed in 70 degrees of flexion, posterior drawer applied.  Graft pulled  tightly and then fixed in tibial tunnel over a Nitinol wire with another 8 x  25 bioabsorbable screw.  At completion excellent capturing and fixation of  the graft above and below.  Full motion with good clearance in the notch  when viewed arthroscopically.  Negative Lachman and drawer.  When that was complete, the knee was placed in a valgus position and all of the structures  laterally were firmly  tied in place tying the FiberWire in the soft tissues  to the FiberWire in the anchor.  This gave a nice stable solid  reconstruction of the entire lateral aspect of the knee repairing LCL and  incorporating the conjoined tendon in the posterolateral capsule to bring  this back into anatomic position.  Once firmly tied, the knee was examined.  I could bring it to full motion after repair of the lateral structures and  ACL reconstruction without any  tethering or impingement.  Full flexion as  well.  Excellent stability to stress, varus and valgus as well as anterior  and posterior.  The wounds were then all irrigated.  The incision closed  with subcutaneous and subcuticular Vicryl.  Portals closed with 4-0 nylon.  Margins of wound injected with Marcaine.  Sterile compressive dressing  applied.  Bledsoe brace applied locked at 30 degrees of flexion.  Tourniquet  deflated and removed.  Anesthesia reversed.  Brought to recovery room.  Tolerated surgery well.  No complications.                                               Loreta Ave, M.D.    DFM/MEDQ  D:  10/16/2003  T:  10/16/2003  Job:  [id number]

## 2010-12-28 ENCOUNTER — Other Ambulatory Visit: Payer: Self-pay | Admitting: Family Medicine

## 2010-12-28 MED ORDER — LISDEXAMFETAMINE DIMESYLATE 70 MG PO CAPS: 70.0000 mg | ORAL_CAPSULE | ORAL | Status: DC

## 2010-12-28 NOTE — Telephone Encounter (Signed)
Done

## 2010-12-28 NOTE — Telephone Encounter (Signed)
Please advise and confirm is this yours or [place] pt. Thanks.

## 2010-12-28 NOTE — Telephone Encounter (Signed)
My pt.  Ok to fill.

## 2010-12-28 NOTE — Telephone Encounter (Signed)
Refill vyvance - patient will pick up Wednesday - [id number]

## 2010-12-31 ENCOUNTER — Encounter: Payer: Self-pay | Admitting: Family Medicine

## 2011-01-01 ENCOUNTER — Encounter: Payer: Self-pay | Admitting: Family Medicine

## 2011-01-06 ENCOUNTER — Encounter: Payer: Self-pay | Admitting: Family Medicine

## 2011-01-06 ENCOUNTER — Ambulatory Visit (INDEPENDENT_AMBULATORY_CARE_PROVIDER_SITE_OTHER): Payer: BC Managed Care – PPO | Admitting: Family Medicine

## 2011-01-06 VITALS — BP 112/68 | HR 80 | Temp 98.4°F | Wt 123.4 lb

## 2011-01-06 DIAGNOSIS — J029 Acute pharyngitis, unspecified: Secondary | ICD-10-CM

## 2011-01-06 DIAGNOSIS — H9190 Unspecified hearing loss, unspecified ear: Secondary | ICD-10-CM

## 2011-01-06 DIAGNOSIS — H9193 Unspecified hearing loss, bilateral: Secondary | ICD-10-CM

## 2011-01-06 MED ORDER — AMOXICILLIN 875 MG PO TABS: 875.0000 mg | ORAL_TABLET | Freq: Two times a day (BID) | ORAL | Status: AC

## 2011-01-06 NOTE — Patient Instructions (Signed)
Hearing Loss A hearing loss is sometimes called deafness. Hearing loss may be partial or total. This can be caused by:  Wax in the ear canal.  Infection of the ear canal.   Infection of the middle ear.   Trauma to the ear or surrounding area.   Fluid in the middle ear.  A hole in the eardrum (perforated eardrum).   Exposure to loud sounds or music.  Problems with the hearing nerve.   Certain medications.   Hearing loss without wax, infection, or a history of injury may mean that the nerve is involved. Hearing loss with severe dizziness, nausea and vomiting or ringing in the ear may suggest a hearing nerve irritation or problems in the middle or inner ear. If hearing loss is untreated, there is a greater likelihood for residual or permanent hearing loss. A hearing test (audiometry) assesses hearing loss. The audiometry test needs to be performed by a hearing specialist (audiologist). Treatment for recent onset of hearing loss may include:  Ear wax removal.   Medications that kill germs (antibiotics).   Cortisone medications.   Prompt follow up with the appropriate specialist.  Return of hearing depends on the cause of your hearing loss, so proper medical follow-up is important. Some hearing loss may not be reversible, and a caregiver should discuss care and treatment options with you. SEEK MEDICAL CARE IF:  You have a severe headache, dizziness, or changes in vision.   You have new or increased weakness.   You develop repeated vomiting or other serious medical problems.   You or your child has an oral temperature above 102 F (38.9 C).  Document Released: 06/27/2005 Document Re-Released: 12/15/2009 [facility] Patient Information 2011 [place], [place].

## 2011-01-06 NOTE — Progress Notes (Signed)
  Subjective:     Sarah Stokes is a 25 y.o. female who presents for evaluation of sore throat. Associated symptoms include dry cough, headache, post nasal drip and sore throat. Onset of symptoms was 7 days ago, and have been gradually worsening since that time. She is drinking plenty of fluids. She has not had a recent close exposure to someone with proven streptococcal pharyngitis.  Pt is also c/o hearing loss but that hearing loss has gone on for years and is progressively getting worse.  The following portions of the patient's history were reviewed and updated as appropriate: allergies, current medications, past family history, past medical history, past social history, past surgical history and problem list.  Review of Systems Pertinent items are noted in HPI.    Objective:    BP 112/68  Pulse 80  Temp(Src) 98.4 F (36.9 C) (Oral)  Wt 123 lb 6.4 oz (55.974 kg)  SpO2 97% General appearance: alert, cooperative, appears stated age and mild distress Head: Normocephalic, without obvious abnormality, atraumatic Ears: normal TM's and external ear canals both ears Nose: Nares normal. Septum midline. Mucosa normal. No drainage or sinus tenderness. Throat: abnormal findings: mild oropharyngeal erythema Neck: mild anterior cervical adenopathy and thyroid not enlarged, symmetric, no tenderness/mass/nodules Lungs: clear to auscultation bilaterally Heart: regular rate and rhythm, S1, S2 normal, no murmur, click, rub or gallop Lymph nodes: Cervical adenopathy: b/l  Laboratory Strep test not done. Results:na.    Assessment:    Acute pharyngitis Hearing loss--not related to uri symptoms-- refer to ENT    Plan:    Patient placed on antibiotics. Use of OTC analgesics recommended as well as salt water gargles. Use of decongestant recommended. Follow up as needed.

## 2011-01-11 ENCOUNTER — Encounter: Payer: Self-pay | Admitting: Family Medicine

## 2011-01-31 ENCOUNTER — Other Ambulatory Visit: Payer: Self-pay

## 2011-01-31 MED ORDER — LISDEXAMFETAMINE DIMESYLATE 70 MG PO CAPS: 70.0000 mg | ORAL_CAPSULE | ORAL | Status: DC

## 2011-01-31 NOTE — Telephone Encounter (Signed)
VM left advising patient Rx ready for pick up.     KP 

## 2011-03-02 ENCOUNTER — Other Ambulatory Visit: Payer: Self-pay | Admitting: Family Medicine

## 2011-03-02 ENCOUNTER — Telehealth: Payer: Self-pay | Admitting: Family Medicine

## 2011-03-02 MED ORDER — LISDEXAMFETAMINE DIMESYLATE 70 MG PO CAPS: 70.0000 mg | ORAL_CAPSULE | ORAL | Status: DC

## 2011-03-02 NOTE — Telephone Encounter (Signed)
Refill vyvance 70mg  --- patient will pick up Friday [id number]

## 2011-03-02 NOTE — Telephone Encounter (Signed)
Rx printed and placed up front 

## 2011-03-02 NOTE — Telephone Encounter (Signed)
Refill vyvance 70mg --- patient will pick up Friday [id number] ° ° °

## 2011-04-01 ENCOUNTER — Other Ambulatory Visit: Payer: Self-pay

## 2011-04-01 MED ORDER — LISDEXAMFETAMINE DIMESYLATE 70 MG PO CAPS: 70.0000 mg | ORAL_CAPSULE | ORAL | Status: DC

## 2011-04-01 NOTE — Telephone Encounter (Signed)
VM left advising patient Rx will be ready on Monday     KP

## 2011-05-06 ENCOUNTER — Other Ambulatory Visit: Payer: Self-pay | Admitting: Family Medicine

## 2011-05-06 MED ORDER — LISDEXAMFETAMINE DIMESYLATE 70 MG PO CAPS: 70.0000 mg | ORAL_CAPSULE | ORAL | Status: DC

## 2011-05-06 NOTE — Telephone Encounter (Signed)
Left Pt detail message Rx will be ready for pickup after 9 am on Monday.

## 2011-05-23 ENCOUNTER — Other Ambulatory Visit: Payer: Self-pay | Admitting: Family Medicine

## 2011-05-23 MED ORDER — LISDEXAMFETAMINE DIMESYLATE 70 MG PO CAPS: 70.0000 mg | ORAL_CAPSULE | ORAL | Status: DC

## 2011-05-23 NOTE — Telephone Encounter (Signed)
Patient lost bottle of vyvance - she took a the med for 1 week needs new rx

## 2011-05-23 NOTE — Telephone Encounter (Signed)
Left refill for add medication at front desk. Advised pt needs CPE per has not had one since 2010

## 2011-05-23 NOTE — Telephone Encounter (Signed)
Last OV 01-06-11 last refill 05-06-11

## 2011-05-23 NOTE — Telephone Encounter (Signed)
Ok for #30, no refills.  Has not had CPE since 2010- needs to schedule OV

## 2011-06-08 ENCOUNTER — Encounter: Payer: BC Managed Care – PPO | Admitting: Family Medicine

## 2011-07-14 ENCOUNTER — Encounter: Payer: BC Managed Care – PPO | Admitting: Family Medicine

## 2011-07-27 ENCOUNTER — Ambulatory Visit (INDEPENDENT_AMBULATORY_CARE_PROVIDER_SITE_OTHER): Payer: 59 | Admitting: Family Medicine

## 2011-07-27 ENCOUNTER — Encounter: Payer: Self-pay | Admitting: Family Medicine

## 2011-07-27 DIAGNOSIS — J3489 Other specified disorders of nose and nasal sinuses: Secondary | ICD-10-CM

## 2011-07-27 DIAGNOSIS — Z0000 Encounter for general adult medical examination without abnormal findings: Secondary | ICD-10-CM | POA: Insufficient documentation

## 2011-07-27 DIAGNOSIS — J329 Chronic sinusitis, unspecified: Secondary | ICD-10-CM | POA: Insufficient documentation

## 2011-07-27 LAB — HEPATIC FUNCTION PANEL
Albumin: 3.6 g/dL (ref 3.5–5.2)
Alkaline Phosphatase: 58 U/L (ref 39–117)
Total Protein: 6.9 g/dL (ref 6.0–8.3)

## 2011-07-27 LAB — CBC WITH DIFFERENTIAL/PLATELET
Basophils Relative: 0.5 % (ref 0.0–3.0)
Eosinophils Relative: 3.2 % (ref 0.0–5.0)
HCT: 38.4 % (ref 36.0–46.0)
Hemoglobin: 12.7 g/dL (ref 12.0–15.0)
Lymphs Abs: 3.6 10*3/uL (ref 0.7–4.0)
MCV: 87.5 fl (ref 78.0–100.0)
Monocytes Absolute: 0.5 10*3/uL (ref 0.1–1.0)
Monocytes Relative: 6.4 % (ref 3.0–12.0)
Neutro Abs: 3.1 10*3/uL (ref 1.4–7.7)
RBC: 4.39 Mil/uL (ref 3.87–5.11)
WBC: 7.4 10*3/uL (ref 4.5–10.5)

## 2011-07-27 LAB — BASIC METABOLIC PANEL
BUN: 8 mg/dL (ref 6–23)
CO2: 25 mEq/L (ref 19–32)
Calcium: 9.2 mg/dL (ref 8.4–10.5)
GFR: 73.09 mL/min (ref >60.00)
Glucose, Bld: 67 mg/dL — ABNORMAL LOW (ref 70–99)
Sodium: 139 mEq/L (ref 135–145)

## 2011-07-27 LAB — LIPID PANEL
Cholesterol: 182 mg/dL (ref 0–200)
LDL Cholesterol: 111 mg/dL — ABNORMAL HIGH (ref 0–99)
Total CHOL/HDL Ratio: 3
Triglycerides: 92 mg/dL (ref 0.0–149.0)

## 2011-07-27 LAB — TSH: TSH: 3.11 u[IU]/mL (ref 0.35–5.50)

## 2011-07-27 MED ORDER — LISDEXAMFETAMINE DIMESYLATE 70 MG PO CAPS: 70.0000 mg | ORAL_CAPSULE | ORAL | Status: DC

## 2011-07-27 MED ORDER — CLARITHROMYCIN ER 500 MG PO TB24: 1000.0000 mg | ORAL_TABLET | Freq: Every day | ORAL | Status: AC

## 2011-07-27 NOTE — Assessment & Plan Note (Signed)
Pt's PE consistent w/ sinus infxn.  Start abx.  Reviewed supportive care and red flags that should prompt return.  Pt expressed understanding and is in agreement w/ plan.  

## 2011-07-27 NOTE — Progress Notes (Signed)
Quick Note:  Pt aware ______ 

## 2011-07-27 NOTE — Assessment & Plan Note (Signed)
Pt's PE WNL.  UTD on GYN.  Check labs.  Anticipatory guidance provided.  

## 2011-07-27 NOTE — Patient Instructions (Signed)
You look great!!!  Keep up the good work! Start the Biaxin for the sinus infection- 2 tabs at the same time, w/ food Mucinex to thin your congestion We'll notify you of your lab results Call with any questions or concerns [employer] this semester!!!

## 2011-07-27 NOTE — Progress Notes (Signed)
  Subjective:    Patient ID: Sarah Stokes, female    DOB: July 24, 1985, 26 y.o.   MRN: [id number]  HPI GYN- [facility]  Bilateral ear pain- no fevers, + nasal congestion.  sxs started 2 weeks ago.  + HA, sore throat.  No cough.  + sick contacts.   Review of Systems Patient reports no vision/ hearing changes, adenopathy,fever, weight change,  persistant/recurrent hoarseness , swallowing issues, chest pain, palpitations, edema, persistant/recurrent cough, hemoptysis, dyspnea (rest/exertional/paroxysmal nocturnal), gastrointestinal bleeding (melena, rectal bleeding), abdominal pain, significant heartburn, bowel changes, GU symptoms (dysuria, hematuria, incontinence), Gyn symptoms (abnormal  bleeding, pain),  syncope, focal weakness, memory loss, numbness & tingling, skin/hair/nail changes, abnormal bruising or bleeding, anxiety, or depression.     Objective:   Physical Exam General Appearance:    Alert, cooperative, no distress, appears stated age  Head:    Normocephalic, without obvious abnormality, atraumatic  Eyes:    PERRL, conjunctiva/corneas clear, EOM's intact, fundi    benign, both eyes  Ears:    Normal TM's and external ear canals, both ears  Nose:   Nares normal, septum midline, mucosa normal, no drainage, + maxillary tenderness, no frontal tenderness  Throat:   Lips, mucosa, and tongue normal; teeth and gums normal  Neck:   Supple, symmetrical, trachea midline, no adenopathy;    Thyroid: no enlargement/tenderness/nodules  Back:     Symmetric, no curvature, ROM normal, no CVA tenderness  Lungs:     Clear to auscultation bilaterally, respirations unlabored  Chest Wall:    No tenderness or deformity   Heart:    Regular rate and rhythm, S1 and S2 normal, no murmur, rub   or gallop  Breast Exam:    Deferred to GYN  Abdomen:     Soft, non-tender, bowel sounds active all four quadrants,    no masses, no organomegaly  Genitalia:    Deferred to GYN  Rectal:    Extremities:    Extremities normal, atraumatic, no cyanosis or edema  Pulses:   2+ and symmetric all extremities  Skin:   Skin color, texture, turgor normal, no rashes or lesions  Lymph nodes:   Cervical, supraclavicular, and axillary nodes normal  Neurologic:   CNII-XII intact, normal strength, sensation and reflexes    throughout          Assessment & Plan:

## 2011-07-30 LAB — VITAMIN D 1,25 DIHYDROXY
Vitamin D2 1, 25 (OH)2: <8 pg/mL
Vitamin D3 1, 25 (OH)2: 49 pg/mL

## 2011-08-01 ENCOUNTER — Encounter: Payer: Self-pay | Admitting: *Deleted

## 2011-08-30 ENCOUNTER — Telehealth: Payer: Self-pay | Admitting: Family Medicine

## 2011-08-30 MED ORDER — LISDEXAMFETAMINE DIMESYLATE 70 MG PO CAPS: 70.0000 mg | ORAL_CAPSULE | Freq: Every morning | ORAL | Status: DC

## 2011-08-30 NOTE — Telephone Encounter (Signed)
Patient requesting new rx for Vyvanse. Call when ready to pick up.

## 2011-08-30 NOTE — Telephone Encounter (Signed)
Left Pt detail message Rx ready for pickup 

## 2011-10-03 ENCOUNTER — Other Ambulatory Visit: Payer: Self-pay | Admitting: *Deleted

## 2011-10-03 MED ORDER — LISDEXAMFETAMINE DIMESYLATE 70 MG PO CAPS: 70.0000 mg | ORAL_CAPSULE | Freq: Every morning | ORAL | Status: DC

## 2011-10-03 NOTE — Telephone Encounter (Signed)
Pt aware Rx ready for pick up 

## 2011-11-04 ENCOUNTER — Other Ambulatory Visit: Payer: Self-pay | Admitting: *Deleted

## 2011-11-04 MED ORDER — LISDEXAMFETAMINE DIMESYLATE 70 MG PO CAPS: 70.0000 mg | ORAL_CAPSULE | Freq: Every morning | ORAL | Status: DC

## 2011-11-04 NOTE — Telephone Encounter (Signed)
Rx ready for pick up left Pt detail message 

## 2011-11-08 ENCOUNTER — Ambulatory Visit (INDEPENDENT_AMBULATORY_CARE_PROVIDER_SITE_OTHER): Payer: 59 | Admitting: Internal Medicine

## 2011-11-08 ENCOUNTER — Encounter: Payer: Self-pay | Admitting: Internal Medicine

## 2011-11-08 VITALS — BP 110/72 | HR 78 | Temp 98.3°F | Wt 128.0 lb

## 2011-11-08 DIAGNOSIS — J329 Chronic sinusitis, unspecified: Secondary | ICD-10-CM

## 2011-11-08 MED ORDER — AMOXICILLIN 500 MG PO CAPS: 1000.0000 mg | ORAL_CAPSULE | Freq: Two times a day (BID) | ORAL | Status: AC

## 2011-11-08 MED ORDER — AZELASTINE HCL 0.1 % NA SOLN: 2.0000 | Freq: Two times a day (BID) | NASAL | Status: AC

## 2011-11-08 NOTE — Progress Notes (Signed)
  Subjective:    Patient ID: Sarah Stokes, female    DOB: Nov 10, 1985, 26 y.o.   MRN: [id number]  HPI Acute visit One-week history of sore throat, frontal headache, and the headache is different from her migraines. She does have a lot of postnasal dripping but is unable to blow any nasal discharge. She is taking Claritin-D.  Past Medical History  Diagnosis Date  . Migraine 2006  . Anxiety       Review of Systems No fever chills Had some nausea and vomiting one time when the headache was intense. She does have dry cough. Some itchy eyes but no itchy nose. Mild sneezing.    Objective:   Physical Exam General -- alert, well-developed. No apparent distress.  HEENT -- TMs normal, throat w/o redness, face symmetric and slt  tender to palpation @ frontal sinuses, nose slt  congested;  EOMI  Lungs -- normal respiratory effort, no intercostal retractions, no accessory muscle use, and normal breath sounds.   Heart-- normal rate, regular rhythm, no murmur, and no gallop.   Neurologic-- alert & oriented X3 and strength normal in all extremities. Psych-- Cognition and judgment appear intact. Alert and cooperative with normal attention span and concentration.  not anxious appearing and not depressed appearing.       Assessment & Plan:  Mild sinusitis. Symptoms consistent with mild sinusitis, will prescribe amoxicillin, see instructions. Okay to continue taking Claritin-D on a short-term basis. Aware that abx may decrease birth-control efficacy

## 2011-11-08 NOTE — Patient Instructions (Signed)
Rest, fluids , tylenol For cough, take Mucinex DM twice a day as needed  For congestion use astelin nasal spray twice a day until you feel better Take the antibiotic as prescribed  (Amoxicillin) Call if no better in few days Call anytime if the symptoms are severe 

## 2011-12-06 ENCOUNTER — Other Ambulatory Visit: Payer: Self-pay | Admitting: *Deleted

## 2011-12-06 MED ORDER — LISDEXAMFETAMINE DIMESYLATE 70 MG PO CAPS: 70.0000 mg | ORAL_CAPSULE | Freq: Every morning | ORAL | Status: DC

## 2011-12-06 NOTE — Telephone Encounter (Signed)
Pt aware Rx ready for pick up 

## 2012-01-04 ENCOUNTER — Other Ambulatory Visit: Payer: Self-pay | Admitting: *Deleted

## 2012-01-04 MED ORDER — LISDEXAMFETAMINE DIMESYLATE 70 MG PO CAPS: 70.0000 mg | ORAL_CAPSULE | Freq: Every morning | ORAL | Status: DC

## 2012-01-04 NOTE — Telephone Encounter (Signed)
Noted vm from pt requesting refill on her vyvanse, noted last OV 07-27-11 and last refill 12-06-11 #30 with no refills, signed RX placed at front desk for pick up, pt made aware via vm

## 2012-02-03 ENCOUNTER — Other Ambulatory Visit: Payer: Self-pay | Admitting: *Deleted

## 2012-02-03 MED ORDER — LISDEXAMFETAMINE DIMESYLATE 70 MG PO CAPS: 70.0000 mg | ORAL_CAPSULE | Freq: Every morning | ORAL | Status: DC

## 2012-02-03 NOTE — Telephone Encounter (Signed)
Refill request for VYVANSE Last filled-01/04/12 Last seen-07/2011 for CPE Follow up - not noted  Please advise follow up

## 2012-02-03 NOTE — Telephone Encounter (Signed)
Placed RX up front for pick up, called pt to advise on both home and work numbers noted, pt unavailable left vm to advise her RX is ready for pick up at the front desk

## 2012-02-03 NOTE — Telephone Encounter (Signed)
Ok for #30 

## 2012-03-16 ENCOUNTER — Other Ambulatory Visit: Payer: Self-pay | Admitting: *Deleted

## 2012-03-16 MED ORDER — LISDEXAMFETAMINE DIMESYLATE 70 MG PO CAPS: 70.0000 mg | ORAL_CAPSULE | Freq: Every morning | ORAL | Status: DC

## 2012-03-16 NOTE — Telephone Encounter (Signed)
Pt aware Rx ready for pick up 

## 2012-04-17 ENCOUNTER — Other Ambulatory Visit: Payer: Self-pay

## 2012-04-17 MED ORDER — LISDEXAMFETAMINE DIMESYLATE 70 MG PO CAPS: 70.0000 mg | ORAL_CAPSULE | Freq: Every morning | ORAL | Status: DC

## 2012-04-17 NOTE — Telephone Encounter (Signed)
Placed signed RX at front desk, pt made aware via phone

## 2012-04-17 NOTE — Telephone Encounter (Signed)
Last OV 11/08/11. Last filled 03/16/12 #30 no refills. Need okay         MW

## 2012-04-17 NOTE — Telephone Encounter (Signed)
Ok for #30 

## 2012-05-22 ENCOUNTER — Other Ambulatory Visit: Payer: Self-pay

## 2012-05-22 NOTE — Telephone Encounter (Signed)
OV 11/08/11 Last filled 04/17/12 #30 no refills    Plz advise    MW

## 2012-05-22 NOTE — Telephone Encounter (Signed)
Ok for #30 

## 2012-05-23 MED ORDER — LISDEXAMFETAMINE DIMESYLATE 70 MG PO CAPS: 70.0000 mg | ORAL_CAPSULE | Freq: Every morning | ORAL | Status: DC

## 2012-05-23 NOTE — Telephone Encounter (Signed)
Rx printed to be faxed.     MW  

## 2012-06-22 ENCOUNTER — Other Ambulatory Visit: Payer: Self-pay | Admitting: *Deleted

## 2012-06-22 ENCOUNTER — Encounter: Payer: Self-pay | Admitting: *Deleted

## 2012-06-22 MED ORDER — LISDEXAMFETAMINE DIMESYLATE 70 MG PO CAPS: 70.0000 mg | ORAL_CAPSULE | Freq: Every morning | ORAL | Status: DC

## 2012-06-22 NOTE — Telephone Encounter (Signed)
Pt aware Rx ready for pick up 

## 2012-07-16 ENCOUNTER — Encounter: Payer: Self-pay | Admitting: *Deleted

## 2012-07-17 ENCOUNTER — Ambulatory Visit: Payer: BC Managed Care – PPO | Admitting: Family Medicine

## 2012-07-18 ENCOUNTER — Encounter: Payer: Self-pay | Admitting: Lab

## 2012-07-19 ENCOUNTER — Ambulatory Visit (INDEPENDENT_AMBULATORY_CARE_PROVIDER_SITE_OTHER): Payer: BC Managed Care – PPO | Admitting: Family Medicine

## 2012-07-19 ENCOUNTER — Encounter: Payer: Self-pay | Admitting: Family Medicine

## 2012-07-19 VITALS — BP 122/80 | HR 83 | Temp 98.3°F | Ht 62.25 in | Wt 130.0 lb

## 2012-07-19 DIAGNOSIS — F988 Other specified behavioral and emotional disorders with onset usually occurring in childhood and adolescence: Secondary | ICD-10-CM

## 2012-07-19 MED ORDER — LISDEXAMFETAMINE DIMESYLATE 70 MG PO CAPS: 70.0000 mg | ORAL_CAPSULE | Freq: Every morning | ORAL | Status: DC

## 2012-07-19 NOTE — Patient Instructions (Addendum)
Schedule your complete physical in 6 months Keep up the good work!  You look great! Call if after a few months the dose still seems wrong CONGRATS!!!

## 2012-07-19 NOTE — Progress Notes (Signed)
  Subjective:    Patient ID: Sarah Stokes, female    DOB: 12-28-1985, 27 y.o.   MRN: [id number]  HPI ADD- currently on Vyvanse 70 mg daily.  Doing well w/ focus and attention, performing well at work.  Sleeping well, denies palpitations, anorexia.  Feels the vyvanse may be less effective than previous.  Pt has recently been married and had a lot on her plate.     Review of Systems For ROS see HPI     Objective:   Physical Exam  Vitals reviewed. Constitutional: She is oriented to person, place, and time. She appears well-developed and well-nourished. No distress.  HENT:  Head: Normocephalic and atraumatic.  Eyes: Conjunctivae normal and EOM are normal. Pupils are equal, round, and reactive to light.  Neck: Normal range of motion. Neck supple. No thyromegaly present.  Cardiovascular: Normal rate, regular rhythm, normal heart sounds and intact distal pulses.   No murmur heard. Pulmonary/Chest: Effort normal and breath sounds normal. No respiratory distress.  Abdominal: Soft. She exhibits no distension. There is no tenderness.  Musculoskeletal: She exhibits no edema.  Lymphadenopathy:    She has no cervical adenopathy.  Neurological: She is alert and oriented to person, place, and time.  Skin: Skin is warm and dry.  Psychiatric: She has a normal mood and affect. Her behavior is normal.          Assessment & Plan:

## 2012-07-22 NOTE — Assessment & Plan Note (Signed)
Chronic problem.  Pt thinks sxs are well controlled on current dose but admits to some decreased attention recently w/ all the external distractions/stressors.  Pt wants to allow things to calm down prior to adjusting dose.  Will follow closely.  Refill on meds provided.

## 2012-07-24 ENCOUNTER — Telehealth: Payer: Self-pay | Admitting: *Deleted

## 2012-07-24 NOTE — Telephone Encounter (Signed)
Patient called the office, has lost script for [facility] given to her on Thursday's (1/9) OV and needs another one. Advised that MD out of office until tomorrow but message would be sent with pts request. Pt verbalized understanding.

## 2012-07-25 MED ORDER — LISDEXAMFETAMINE DIMESYLATE 70 MG PO CAPS: 70.0000 mg | ORAL_CAPSULE | Freq: Every morning | ORAL | Status: DC

## 2012-07-25 NOTE — Telephone Encounter (Signed)
Ok for 1 time refill of lost meds- must be more careful w/ controlled substances as people will find her script and fill it in her name

## 2012-07-25 NOTE — Telephone Encounter (Signed)
Msg left advising Rx ready for pick up.      KP 

## 2012-07-25 NOTE — Addendum Note (Signed)
Addended by: Arnette Norris on: 07/25/2012 08:14 AM   Modules accepted: Orders

## 2012-07-25 NOTE — Telephone Encounter (Signed)
Pt called back stating that she found Rx so she does not need another Rx.

## 2012-08-17 ENCOUNTER — Ambulatory Visit (INDEPENDENT_AMBULATORY_CARE_PROVIDER_SITE_OTHER): Payer: BC Managed Care – PPO | Admitting: Internal Medicine

## 2012-08-17 ENCOUNTER — Encounter: Payer: Self-pay | Admitting: Internal Medicine

## 2012-08-17 ENCOUNTER — Other Ambulatory Visit: Payer: BC Managed Care – PPO

## 2012-08-17 ENCOUNTER — Ambulatory Visit: Payer: Self-pay | Admitting: Internal Medicine

## 2012-08-17 ENCOUNTER — Telehealth: Payer: Self-pay | Admitting: Family Medicine

## 2012-08-17 VITALS — BP 120/78 | HR 72 | Temp 97.6°F | Ht 62.25 in | Wt 136.6 lb

## 2012-08-17 DIAGNOSIS — Z3490 Encounter for supervision of normal pregnancy, unspecified, unspecified trimester: Secondary | ICD-10-CM

## 2012-08-17 DIAGNOSIS — J069 Acute upper respiratory infection, unspecified: Secondary | ICD-10-CM

## 2012-08-17 DIAGNOSIS — Z331 Pregnant state, incidental: Secondary | ICD-10-CM

## 2012-08-17 MED ORDER — AZITHROMYCIN 250 MG PO TABS: ORAL_TABLET | ORAL | Status: DC

## 2012-08-17 NOTE — Patient Instructions (Addendum)
Medicines During Pregnancy During pregnancy, there are medicines that are either safe or unsafe to take. Medicines include prescriptions from your caregiver, over-the-counter medicines, topical creams applied to the skin, and all herbal substances. Medicines are put into either Class A, B, C, or D. Class A and B medicines have been shown to be safe in pregnancy. Class C medicines are also considered to be safe in pregnancy, but these medicines should only be used when necessary. Class D medicines should not be used at all in pregnancy. They can be harmful to a baby.   It is best to take as little medicine as possible while pregnant. However, some medicines are necessary to take for the mother and baby's health. Sometimes, it is more dangerous to stop taking certain medicines than to stay on them. This is often the case for people with long-term (chronic) conditions such as asthma, diabetes, or high blood pressure (hypertension). If you are pregnant and have a chronic illness, call your caregiver right away. Bring a list of your medicines and their doses to your appointments. If you are planning to become pregnant, schedule a doctor's appointment and discuss your medicines with your caregiver. Lastly, write down the phone number to your pharmacist. They can answer questions regarding a medicine's class and safety. They cannot give advice as to whether you should or should not be on a medicine.   SAFE AND UNSAFE MEDICINES There is a long list of medicines that are considered safe in pregnancy. Below is a shorter list. For specific medicines, ask your caregiver.   Allergy Medicines Loratadine, cetirizine, and chlorpheniramine are safe to take. Certain nasal steroid sprays are safe. Talk to your caregiver about specific brands that are safe. Analgesics Acetaminophen and acetaminophen with codeine are safe to take. All other nonsteroidal anti-inflammatory drugs (NSAIDS) are not safe. This includes ibuprofen.     Antacids  Many over-the-counter antacids are safe to take. Talk to your caregiver about specific brands that are safe. Famotidine, ranitidine, and lansoprazole are safe. Omepresole is considered safe to take in the second trimester. Antibiotic Medicines  There are several antibiotics to avoid. These include, but are not limited to, tetracyline, quinolones, and sulfa medications. Talk to your caregiver before taking any antibiotic.   Antihistamines  Talk to your caregiver about specific brands that are safe.   Asthma Medicines  Most asthma steroid inhalers are safe to take. Talk to your caregiver for specific details. Calcium  Calcium supplements are safe to take. Do not take oyster shell calcium.   Cough and Cold Medicines  It is safe to take products with guaifenesin or dextromethorphan. Talk to your caregiver about specific brands that are safe. It is not safe to take products that contain aspirin or ibuprofen. Decongestant Medicines Pseudoephedrine-containing products are safe to take in the second and third trimester.   Depression Medicines  Talk about these medicines with your caregiver.   Antidiarrheal Medicines  It is safe to take loperamide. Talk to your caregiver about specific brands that are safe. It is not safe to take any antidiarrheal medicine that contains bismuth. Eyedrops  Allergy eyedrops should be limited.   Iron  It is safe to use certain iron-containing medicines for anemia in pregnancy. They require a prescription.   Antinausea Medicines  It is safe to take doxylamine and vitamin B6 as directed. There are other prescription medicines available, if needed.   Sleep aids  It is safe to take diphenhydramine and acetaminophen with diphenhydramine.   Steroids  Hydrocortisone creams are safe to use as directed. Oral steroids require a prescription. It is not safe to take any hemorrhoid cream with pramoxine or phenylephrine. Stool softener  It is safe to take stool  softener medicines. Avoid daily or prolonged use of stool softeners. Thyroid Medicine  It is important to stay on this thyroid medicine. It needs to be followed by your caregiver.   Vaginal Medicines  Your caregiver will prescribe a medicine to you if you have a vaginal infection. Certain antifungal medicines are safe to use if you have a sexually transmitted infection (STI). Talk to your caregiver.   Document Released: 06/27/2005 Document Revised: 09/19/2011 Document Reviewed: 06/28/2011 [facility] Patient Information 2013 [place], [place].   Pregnancy - First Trimester During sexual intercourse, millions of sperm go into the vagina. Only 1 sperm will penetrate and fertilize the female egg while it is in the Fallopian tube. One week later, the fertilized egg implants into the wall of the uterus. An embryo begins to develop into a baby. At 6 to 8 weeks, the eyes and face are formed and the heartbeat can be seen on ultrasound. At the end of 12 weeks (first trimester), all the baby's organs are formed. Now that you are pregnant, you will want to do everything you can to have a healthy baby. Two of the most important things are to get good prenatal care and follow your caregiver's instructions. Prenatal care is all the medical care you receive before the baby's birth. It is given to prevent, find, and treat problems during the pregnancy and childbirth. PRENATAL EXAMS  During prenatal visits, your weight, blood pressure and urine are checked. This is done to make sure you are healthy and progressing normally during the pregnancy.   A pregnant woman should gain 25 to 35 pounds during the pregnancy. However, if you are over weight or underweight, your caregiver will advise you regarding your weight.   Your caregiver will ask and answer questions for you.   Blood work, cervical cultures, other necessary tests and a Pap test are done during your prenatal exams. These tests are done to check on your health and  the probable health of your baby. Tests are strongly recommended and done for HIV with your permission. This is the virus that causes AIDS. These tests are done because medications can be given to help prevent your baby from being born with this infection should you have been infected without knowing it. Blood work is also used to find out your blood type, previous infections and follow your blood levels (hemoglobin).   Low hemoglobin (anemia) is common during pregnancy. Iron and vitamins are given to help prevent this. Later in the pregnancy, blood tests for diabetes will be done along with any other tests if any problems develop. You may need tests to make sure you and the baby are doing well.   You may need other tests to make sure you and the baby are doing well.  CHANGES DURING THE FIRST TRIMESTER (THE FIRST 3 MONTHS OF PREGNANCY) Your body goes through many changes during pregnancy. They vary from person to person. Talk to your caregiver about changes you notice and are concerned about. Changes can include:  Your menstrual period stops.   The egg and sperm carry the genes that determine what you look like. Genes from you and your partner are forming a baby. The female genes determine whether the baby is a boy or a girl.   Your body increases in  girth and you may feel bloated.   Feeling sick to your stomach (nauseous) and throwing up (vomiting). If the vomiting is uncontrollable, call your caregiver.   Your breasts will begin to enlarge and become tender.   Your nipples may stick out more and become darker.   The need to urinate more. Painful urination may mean you have a bladder infection.   Tiring easily.   Loss of appetite.   Cravings for certain kinds of food.   At first, you may gain or lose a couple of pounds.   You may have changes in your emotions from day to day (excited to be pregnant or concerned something may go wrong with the pregnancy and baby).   You may have more  vivid and strange dreams.  HOME CARE INSTRUCTIONS    It is very important to avoid all smoking, alcohol and un-prescribed drugs during your pregnancy. These affect the formation and growth of the baby. Avoid chemicals while pregnant to ensure the delivery of a healthy infant.   Start your prenatal visits by the 12th week of pregnancy. They are usually scheduled monthly at first, then more often in the last 2 months before delivery. Keep your caregiver's appointments. Follow your caregiver's instructions regarding medication use, blood and lab tests, exercise, and diet.   During pregnancy, you are providing food for you and your baby. Eat regular, well-balanced meals. Choose foods such as meat, fish, milk and other low fat dairy products, vegetables, fruits, and whole-grain breads and cereals. Your caregiver will tell you of the ideal weight gain.   You can help morning sickness by keeping soda crackers at the bedside. Eat a couple before arising in the morning. You may want to use the crackers without salt on them.   Eating 4 to 5 small meals rather than 3 large meals a day also may help the nausea and vomiting.   Drinking liquids between meals instead of during meals also seems to help nausea and vomiting.   A physical sexual relationship may be continued throughout pregnancy if there are no other problems. Problems may be early (premature) leaking of amniotic fluid from the membranes, vaginal bleeding, or belly (abdominal) pain.   Exercise regularly if there are no restrictions. Check with your caregiver or physical therapist if you are unsure of the safety of some of your exercises. Greater weight gain will occur in the last 2 trimesters of pregnancy. Exercising will help:   Control your weight.   Keep you in shape.   Prepare you for labor and delivery.   Help you lose your pregnancy weight after you deliver your baby.   Wear a good support or jogging bra for breast tenderness during  pregnancy. This may help if worn during sleep too.   Ask when prenatal classes are available. Begin classes when they are offered.   Do not use hot tubs, steam rooms or saunas.   Wear your seat belt when driving. This protects you and your baby if you are in an accident.   Avoid raw meat, uncooked cheese, cat litter boxes and soil used by cats throughout the pregnancy. These carry germs that can cause birth defects in the baby.   The first trimester is a good time to visit your dentist for your dental health. Getting your teeth cleaned is OK. Use a softer toothbrush and brush gently during pregnancy.   Ask for help if you have financial, counseling or nutritional needs during pregnancy. Your caregiver will be  able to offer counseling for these needs as well as refer you for other special needs.   Do not take any medications or herbs unless told by your caregiver.   Inform your caregiver if there is any mental or physical domestic violence.   Make a list of emergency phone numbers of family, friends, hospital, and police and fire departments.   Write down your questions. Take them to your prenatal visit.   Do not douche.   Do not cross your legs.   If you have to stand for long periods of time, rotate you feet or take small steps in a circle.   You may have more vaginal secretions that may require a sanitary pad. Do not use tampons or scented sanitary pads.  MEDICATIONS AND DRUG USE IN PREGNANCY  Take prenatal vitamins as directed. The vitamin should contain 1 milligram of folic acid. Keep all vitamins out of reach of children. Only a couple vitamins or tablets containing iron may be fatal to a baby or young child when ingested.   Avoid use of all medications, including herbs, over-the-counter medications, not prescribed or suggested by your caregiver. Only take over-the-counter or prescription medicines for pain, discomfort, or fever as directed by your caregiver. Do not use aspirin,  ibuprofen, or naproxen unless directed by your caregiver.   Let your caregiver also know about herbs you may be using.   Alcohol is related to a number of birth defects. This includes fetal alcohol syndrome. All alcohol, in any form, should be avoided completely. Smoking will cause low birth rate and premature babies.   Street or illegal drugs are very harmful to the baby. They are absolutely forbidden. A baby born to an addicted mother will be addicted at birth. The baby will go through the same withdrawal an adult does.   Let your caregiver know about any medications that you have to take and for what reason you take them.  MISCARRIAGE IS COMMON DURING PREGNANCY A miscarriage does not mean you did something wrong. It is not a reason to worry about getting pregnant again. Your caregiver will help you with questions you may have. If you have a miscarriage, you may need minor surgery. SEEK MEDICAL CARE IF:   You have any concerns or worries during your pregnancy. It is better to call with your questions if you feel they cannot wait, rather than worry about them. SEEK IMMEDIATE MEDICAL CARE IF:    An unexplained oral temperature above 102 F (38.9 C) develops, or as your caregiver suggests.   You have leaking of fluid from the vagina (birth canal). If leaking membranes are suspected, take your temperature and inform your caregiver of this when you call.   There is vaginal spotting or bleeding. Notify your caregiver of the amount and how many pads are used.   You develop a bad smelling vaginal discharge with a change in the color.   You continue to feel sick to your stomach (nauseated) and have no relief from remedies suggested. You vomit blood or coffee ground-like materials.   You lose more than 2 pounds of weight in 1 week.   You gain more than 2 pounds of weight in 1 week and you notice swelling of your face, hands, feet, or legs.   You gain 5 pounds or more in 1 week (even if you do  not have swelling of your hands, face, legs, or feet).   You get exposed to [place] measles and have never had  them.   You are exposed to fifth disease or chickenpox.   You develop belly (abdominal) pain. Round ligament discomfort is a common non-cancerous (benign) cause of abdominal pain in pregnancy. Your caregiver still must evaluate this.   You develop headache, fever, diarrhea, pain with urination, or shortness of breath.   You fall or are in a car accident or have any kind of trauma.   There is mental or physical violence in your home.  Document Released: 06/21/2001 Document Revised: 09/19/2011 Document Reviewed: 12/23/2008 [facility] Patient Information 2013 [place], [place].

## 2012-08-17 NOTE — Progress Notes (Signed)
HPI  Pt presents to the clinic today with c/o cold symptoms x 1 weeks. The worst part is the sore throat and dry cough. She does not produce any sputum.She has not taken anything OTC because she just found out she was pregnant with a home pregnancy test. She does have sick contacts.  Review of Systems      Past Medical History  Diagnosis Date  . Migraine 2006  . Anxiety     No family history on file.  History   Social History  . Marital Status: Married    Spouse Name: N/A    Number of Children: N/A  . Years of Education: N/A   Occupational History  . Not on file.   Social History Main Topics  . Smoking status: Never Smoker   . Smokeless tobacco: Never Used  . Alcohol Use: No  . Drug Use: No  . Sexually Active: Not on file   Other Topics Concern  . Not on file   Social History Narrative   Graduated from [employer], going to [employer].    No Known Allergies   Constitutional: Positive headache, fatigue and fever. Denies headache, fever or abrupt weight changes.  HEENT:  Positive sore throat. Denies eye redness, eye pain, pressure behind the eyes, facial pain, nasal congestion, ear pain, ringing in the ears, wax buildup, runny nose or bloody nose. Respiratory: Positive cough. Denies difficulty breathing or shortness of breath.  Cardiovascular: Denies chest pain, chest tightness, palpitations or swelling in the hands or feet.   No other specific complaints in a complete review of systems (except as listed in HPI above).  Objective:   BP 120/78  Pulse 72  Temp 97.6 F (36.4 C) (Oral)  Ht 5' 2.25" (1.581 m)  Wt 136 lb 9.6 oz (61.961 kg)  BMI 24.78 kg/m2  SpO2 98%  LMP 07/15/2012 Wt Readings from Last 3 Encounters:  08/17/12 136 lb 9.6 oz (61.961 kg)  07/19/12 130 lb (58.968 kg)  11/08/11 128 lb (58.06 kg)     General: Appears her stated age, well developed, well nourished in NAD. HEENT: Head: normal shape and size; Eyes: sclera white, no  icterus, conjunctiva pink, PERRLA and EOMs intact; Ears: Tm's gray and intact, normal light reflex; Nose: mucosa pink and moist, septum midline; Throat/Mouth: + PND. Teeth present, mucosa erythematous and moist, no exudate noted, no lesions or ulcerations noted.  Neck: Mild cervical lymphadenopathy. Neck supple, trachea midline. No massses, lumps or thyromegaly present.  Cardiovascular: Normal rate and rhythm. S1,S2 noted.  No murmur, rubs or gallops noted. No JVD or BLE edema. No carotid bruits noted. Pulmonary/Chest: Normal effort and positive vesicular breath sounds. No respiratory distress. No wheezes, rales or ronchi noted.      Assessment & Plan:   Upper Respiratory Infection, new onset with additional workup required:  Get some rest and drink plenty of water Do salt water gargles for the sore throat eRx for Azithromax x 5 days  Pregnancy:  Will check serum HCG  RTC as needed or if symptoms persist.

## 2012-08-17 NOTE — Telephone Encounter (Signed)
If pregnant, needs to stop Vyvanse and Xanax IMMEDIATELY!

## 2012-08-17 NOTE — Telephone Encounter (Signed)
Discuss with patient  

## 2012-08-17 NOTE — Telephone Encounter (Signed)
Spoke with patient, patient was placed in a created 4:45 slot with Dr.Hopper, I explained scheduling error to patient and patient was very understanding and agreed to see MD @ [facility] at 3:45

## 2012-08-17 NOTE — Telephone Encounter (Signed)
Patient Information:  Caller Name: Sarah Stokes  Phone: [phone]  Patient: Sarah Stokes  Gender: Female  DOB: 10/23/1985  Age: 27 Years  PCP: Sheliah Hatch  Pregnant: Yes  Office Follow Up:  Does the office need to follow up with this patient?: No  Instructions For The Office: N/A  RN Note:  Pateint is four weeks pregnant has not had confirmation visit with OB at this time.  Patient is having right ear pain with cold symptoms.  Symptoms  Reason For Call & Symptoms: Sore throat, congestion, cough with yellow sputum  Reviewed Health History In EMR: Yes  Reviewed Medications In EMR: Yes  Reviewed Allergies In EMR: Yes  Reviewed Surgeries / Procedures: Yes  Date of Onset of Symptoms: 08/13/2012  Treatments Tried: Tylenol cold  Treatments Tried Worked: No OB / GYN:  LMP: Unknown GYN:  # Of Week Pregnant: 4  # Of pregnancies: 1  # Of Live Births: 0  # Of Miscarriages: 0  # Of Abortions: 0  Last OB Visit: Unknown  Guideline(s) Used:  Colds  Disposition Per Guideline:   See Today in Office  Reason For Disposition Reached:   Earache  Advice Given:  For a Stuffy Nose - Use Nasal Washes:  Introduction: Saline (salt water) nasal irrigation (nasal wash) is an effective and simple home remedy for treating stuffy nose and sinus congestion. The nose can be irrigated by pouring, spraying, or squirting salt water into the nose and then letting it run back out.  How it Helps: The salt water rinses out excess mucus, washes out any irritants (dust, allergens) that might be present, and moistens the nasal cavity.  Methods: There are several ways to perform nasal irrigation. You can use a saline nasal spray bottle (available over-the-counter), a rubber ear syringe, a medical syringe without the needle, or a Neti Pot.  Humidifier:  If the air in your home is dry, use a cool-mist humidifier  Treatment for Associated Symptoms of Colds:  Hydrate: Drink adequate  liquids.  Appointment Scheduled:  08/17/2012 16:45:00 Appointment Scheduled Provider:  Marga Melnick

## 2012-08-18 LAB — HCG, SERUM, QUALITATIVE: Preg, Serum: POSITIVE

## 2012-08-21 ENCOUNTER — Ambulatory Visit: Payer: Self-pay | Admitting: Internal Medicine

## 2012-08-21 ENCOUNTER — Telehealth: Payer: Self-pay | Admitting: Family Medicine

## 2012-08-21 NOTE — Telephone Encounter (Signed)
Patient Information:  Caller Name: T[place]  Phone: [phone]  Patient: Sarah Stokes  Gender: Female  DOB: July 19, 1985  Age: 27 Years  PCP: Nicki Reaper  Pregnant: Yes  Office Follow Up:  Does the office need to follow up with this patient?: No  Instructions For The Office: N/A  RN Note:  No headache at present time, moderate sinus pain  Symptoms  Reason For Call & Symptoms: Sinus congestion with headache and ear congestion  Reviewed Health History In EMR: Yes  Reviewed Medications In EMR: Yes  Reviewed Allergies In EMR: Yes  Reviewed Surgeries / Procedures: Yes  Date of Onset of Symptoms: 08/10/2012  Treatments Tried: Z-Pak  Treatments Tried Worked: No OB / GYN:  LMP: Unknown GYN:  # Of Week Pregnant: 5  # Of pregnancies: 1  # Of Live Births: 0  # Of Miscarriages: 0  # Of Abortions: 0  Last OB Visit: Unknown  Guideline(s) Used:  Sinus Pain and Congestion  Disposition Per Guideline:   See Today in Office  Reason For Disposition Reached:   Earache  Advice Given:  Hydration:  Drink plenty of liquids (6-8 glasses of water daily). If the air in your home is dry, use a cool mist humidifier  For a Stuffy Nose - Use Nasal Washes:  Introduction: Saline (salt water) nasal irrigation (nasal wash) is an effective and simple home remedy for treating stuffy nose and sinus congestion. The nose can be irrigated by pouring, spraying, or squirting salt water into the nose and then letting it run back out.  How it Helps: The salt water rinses out excess mucus, washes out any irritants (dust, allergens) that might be present, and moistens the nasal cavity.  Methods: There are several ways to perform nasal irrigation. You can use a saline nasal spray bottle (available over-the-counter), a rubber ear syringe, a medical syringe without the needle, or a Neti Pot.  Step-By-Step Instructions:   Step 1: Lean over a sink.  Step 2: Gently squirt or spray warm salt water into one of  your nostrils.  Step 3: Some of the water may run into the back of your throat. Spit this out. If you swallow the salt water it will not hurt you.  Step 4: Blow your nose to clean out the water and mucus.  Step 5: Repeat steps 1-4 for the other nostril. You can do this a couple times a day if it seems to help you.  How to Make Saline [facility] Water) Nasal Wash :  You can make your own saline nasal wash.  Add 1/2 tsp of table salt to 1 cup (8 oz; 240 ml) of warm water.  You should use sterile, distilled, or previously boiled water for nasal irrigation.  Call Back If:   You become worse.  Appointment Scheduled:  08/21/2012 16:00:00 Appointment Scheduled Provider:  Nicki Reaper

## 2012-09-19 LAB — OB RESULTS CONSOLE RPR: RPR: NONREACTIVE

## 2012-09-19 LAB — OB RESULTS CONSOLE ABO/RH

## 2012-09-19 LAB — OB RESULTS CONSOLE RUBELLA ANTIBODY, IGM: Rubella: IMMUNE

## 2012-09-19 LAB — OB RESULTS CONSOLE HIV ANTIBODY (ROUTINE TESTING): HIV: NONREACTIVE

## 2012-09-19 LAB — OB RESULTS CONSOLE ANTIBODY SCREEN: Antibody Screen: NEGATIVE

## 2012-11-08 LAB — OB RESULTS CONSOLE GC/CHLAMYDIA
Chlamydia: NEGATIVE
Gonorrhea: NEGATIVE

## 2013-01-04 ENCOUNTER — Ambulatory Visit: Payer: BC Managed Care – PPO | Admitting: Family Medicine

## 2013-04-11 ENCOUNTER — Telehealth (HOSPITAL_COMMUNITY): Payer: Self-pay | Admitting: *Deleted

## 2013-04-11 ENCOUNTER — Encounter (HOSPITAL_COMMUNITY): Payer: Self-pay | Admitting: *Deleted

## 2013-04-11 NOTE — Telephone Encounter (Signed)
Preadmission screen  

## 2013-04-14 ENCOUNTER — Encounter (HOSPITAL_COMMUNITY): Payer: Self-pay

## 2013-04-14 ENCOUNTER — Inpatient Hospital Stay (HOSPITAL_COMMUNITY)
Admission: RE | Admit: 2013-04-14 | Discharge: 2013-04-17 | DRG: 371 | Disposition: A | Discharge status: Home / Self Care | Payer: BC Managed Care – PPO | Source: Ambulatory Visit | Attending: Obstetrics and Gynecology | Admitting: Obstetrics and Gynecology

## 2013-04-14 DIAGNOSIS — M25559 Pain in unspecified hip: Secondary | ICD-10-CM | POA: Diagnosis present

## 2013-04-14 DIAGNOSIS — O26893 Other specified pregnancy related conditions, third trimester: Secondary | ICD-10-CM | POA: Diagnosis present

## 2013-04-14 DIAGNOSIS — O368390 Maternal care for abnormalities of the fetal heart rate or rhythm, unspecified trimester, not applicable or unspecified: Secondary | ICD-10-CM | POA: Diagnosis not present

## 2013-04-14 DIAGNOSIS — O99892 Other specified diseases and conditions complicating childbirth: Secondary | ICD-10-CM | POA: Diagnosis present

## 2013-04-14 DIAGNOSIS — IMO0002 Reserved for concepts with insufficient information to code with codable children: Secondary | ICD-10-CM | POA: Diagnosis present

## 2013-04-14 DIAGNOSIS — Z98891 History of uterine scar from previous surgery: Secondary | ICD-10-CM

## 2013-04-14 HISTORY — DX: History of uterine scar from previous surgery: Z98.891

## 2013-04-14 LAB — CBC
HCT: 35.2 % — ABNORMAL LOW (ref 36.0–46.0)
Hemoglobin: 11.7 g/dL — ABNORMAL LOW (ref 12.0–15.0)
MCH: 27.7 pg (ref 26.0–34.0)
MCHC: 33.2 g/dL (ref 30.0–36.0)
MCV: 83.2 fL (ref 78.0–100.0)
Platelets: 246 10*3/uL (ref 150–400)
RBC: 4.23 MIL/uL (ref 3.87–5.11)
RDW: 14.3 % (ref 11.5–15.5)
WBC: 12.8 10*3/uL — ABNORMAL HIGH (ref 4.0–10.5)

## 2013-04-14 MED ORDER — OXYTOCIN 40 UNITS IN LACTATED RINGERS INFUSION - SIMPLE MED: 62.5000 mL/h | INTRAVENOUS | Status: DC

## 2013-04-14 MED ORDER — SODIUM CHLORIDE 0.9 % IV SOLN: 250.0000 mL | INTRAVENOUS | Status: DC | PRN

## 2013-04-14 MED ORDER — IBUPROFEN 600 MG PO TABS: 600.0000 mg | ORAL_TABLET | Freq: Four times a day (QID) | ORAL | Status: DC | PRN

## 2013-04-14 MED ORDER — LIDOCAINE HCL (PF) 1 % IJ SOLN: 30.0000 mL | INTRAMUSCULAR | Status: DC | PRN

## 2013-04-14 MED ORDER — LACTATED RINGERS IV SOLN
500.0000 mL | INTRAVENOUS | Status: DC | PRN
  Administered 2013-04-14: 300 mL via INTRAVENOUS
  Administered 2013-04-15: 500 mL via INTRAVENOUS
  Administered 2013-04-15 (×2): 300 mL via INTRAVENOUS
  Administered 2013-04-15: 500 mL via INTRAVENOUS
  Administered 2013-04-15: 300 mL via INTRAVENOUS

## 2013-04-14 MED ORDER — MISOPROSTOL 25 MCG QUARTER TABLET
25.0000 ug | ORAL_TABLET | ORAL | Status: DC
  Administered 2013-04-14: 25 ug via VAGINAL
  Filled 2013-04-14: qty 0.25

## 2013-04-14 MED ORDER — SODIUM CHLORIDE 0.9 % IJ SOLN: 3.0000 mL | INTRAMUSCULAR | Status: DC | PRN

## 2013-04-14 MED ORDER — PRENATAL MULTIVITAMIN CH: 1.0000 | ORAL_TABLET | Freq: Every day | ORAL | Status: DC

## 2013-04-14 MED ORDER — CITRIC ACID-SODIUM CITRATE 334-500 MG/5ML PO SOLN
30.0000 mL | ORAL | Status: DC | PRN
  Administered 2013-04-15: 30 mL via ORAL
  Filled 2013-04-14: qty 15

## 2013-04-14 MED ORDER — SODIUM CHLORIDE 0.9 % IJ SOLN: 3.0000 mL | Freq: Two times a day (BID) | INTRAMUSCULAR | Status: DC

## 2013-04-14 MED ORDER — OXYTOCIN BOLUS FROM INFUSION: 500.0000 mL | INTRAVENOUS | Status: DC

## 2013-04-14 MED ORDER — ACETAMINOPHEN 325 MG PO TABS: 650.0000 mg | ORAL_TABLET | ORAL | Status: DC | PRN

## 2013-04-14 MED ORDER — HYDROXYZINE HCL 50 MG PO TABS: 50.0000 mg | ORAL_TABLET | Freq: Once | ORAL | Status: DC

## 2013-04-14 MED ORDER — LACTATED RINGERS IV SOLN
INTRAVENOUS | Status: DC
  Administered 2013-04-14 – 2013-04-15 (×4): via INTRAVENOUS

## 2013-04-14 NOTE — Progress Notes (Signed)
TC from nursing to review EFM  Single variable x 3 minutes ~ 2200 with spontaneous recovery / reactive prior to decel and after decel Verbal order for continuous EFM tonight instead of intermittent  Baseline 145-150 range / moderate variabliity / accels into 180 / some variables to 100-120 Active FM  Nursing call to state difficulty in obtaining continuous tracing due to fetal movement and maternal positioning Readjusted maternal position and working to establish continuous EFM tracing   Cancel further doses of Cytotec due to variable decels Provider monitoring FHR pattern   No longer elective induction - fetal indication for induction of labor   Marlinda Mike CNM, MSN, [facility]

## 2013-04-14 NOTE — H&P (Signed)
OB ADMISSION/ HISTORY & PHYSICAL:  Admission Date: 04/14/2013  7:32 PM  Admit Diagnosis: 39 weeks / hip pain in third trimester (hx bone grafts)  Sarah Stokes is a 27 y.o. female presenting for elective induction of labor for hip pain affecting daily activity. Hx bone grafting from hip - severe pain in past several weeks.  Prenatal History: G1P0   EDC : 04/21/2013, by Last Menstrual Period  Prenatal care at [facility]  Primary Ob Provider: Marlinda Mike CNM Prenatal course complicated by excessive maternal weight gain (>40pounds) / hip pain secondary to history of bone graft from left iliac crest (knee surgery x6)  Prenatal Labs: ABO, Rh:  positive Antibody:  Negative Rubella:   Immune RPR:   NR HBsAg:   Negative HIV:   NR GTT: 135 x 2 GBS: Negative (09/09 0000)   Medical / Surgical History :  Past medical history:  Past Medical History  Diagnosis Date  . Migraine 2006  . Anxiety   . Hx of varicella      Past surgical history:  Past Surgical History  Procedure Laterality Date  . Tonsillectomy    . Knee surgery      left x6, Dr. Eulah Pont  . Hip surgery      took bone for knee  . Labial repair  2006    Family History:  Family History  Problem Relation Age of Onset  . Thyroid disease Mother   . Cancer Mother     melanoma  . Diabetes Maternal Aunt   . Diabetes Maternal Uncle   . Thyroid disease Paternal Grandmother      Social History:  reports that she has never smoked. She has never used smokeless tobacco. She reports that she does not drink alcohol or use illicit drugs.  Allergies: Ambien; Percocet; and Vicodin   Current Medications at time of admission:  Prenatal vitamin daily  Review of Systems: active FM irregular ctx no LOF or vaginal bleeding or discharge   Physical Exam:  VS: Blood pressure 120/70, pulse 74, temperature 98.1 F (36.7 C), temperature source Oral, height 5\' 2"  (1.575 m), weight 81.647 kg (180 lb), last  menstrual period 07/15/2012.  General: alert and oriented, appears calm and comfortable - no pain at present Heart: RRR Lungs: non-labored / clear Abdomen: gravid, soft and non-tender, non-distended / uterus: gravid Extremities: trace edema  Genitalia / VE: Dilation: Fingertip Effacement (%): 50 Station: -3 Exam by:: T.Bailey,CNM  FHR: baseline rate 130 / variability moderate / accelerations + / no decelerations TOCO: rare ctx  Assessment: [redacted] weeks gestation unfavorable Bishop score (2)  patient requested elective induction of labor due to significant hip pain late gestation FHR category 1   Plan:  Admit Discussed with patient plan of care with patient (spouse at bedside)   Patient aware but reminded that risk for cesarean section increased with unfavorable cervix   Need for cervical ripening prior to initiation of labor - long process usually 24-36 hours from admit  Plan cervical ripening overnight with 3 doses of cytotec      If no significant cervical progression                        -would recommend DC home with repeat induction attempt in 2-4 days        If cervical progression - would place cervical balloon in am with possible pitocin augmentation     Will make decisions for induction process based  on cervical status in am and then again if utilization of balloon prior to AROM. After AROM, she is committed to the labor process and if labor does not progress would need cesarean delivery. Understands and agrees with plan of care.  Dr Cherly Hensen notified of admission / plan of care   Marlinda Mike CNM, MSN, [facility] 04/14/2013, 8:30 PM

## 2013-04-15 ENCOUNTER — Encounter (HOSPITAL_COMMUNITY)
Admission: RE | Disposition: A | Discharge status: Home / Self Care | Payer: Self-pay | Source: Ambulatory Visit | Attending: Obstetrics and Gynecology

## 2013-04-15 ENCOUNTER — Encounter (HOSPITAL_COMMUNITY): Payer: Self-pay | Admitting: Anesthesiology

## 2013-04-15 ENCOUNTER — Encounter (HOSPITAL_COMMUNITY): Payer: Self-pay

## 2013-04-15 ENCOUNTER — Inpatient Hospital Stay (HOSPITAL_COMMUNITY): Payer: BC Managed Care – PPO | Admitting: Anesthesiology

## 2013-04-15 DIAGNOSIS — Z98891 History of uterine scar from previous surgery: Secondary | ICD-10-CM

## 2013-04-15 DIAGNOSIS — O368390 Maternal care for abnormalities of the fetal heart rate or rhythm, unspecified trimester, not applicable or unspecified: Secondary | ICD-10-CM | POA: Diagnosis not present

## 2013-04-15 HISTORY — DX: History of uterine scar from previous surgery: Z98.891

## 2013-04-15 LAB — RPR: RPR Ser Ql: NONREACTIVE

## 2013-04-15 LAB — SAMPLE TO BLOOD BANK

## 2013-04-15 SURGERY — Surgical Case
Anesthesia: Epidural

## 2013-04-15 MED ORDER — MEPERIDINE HCL 25 MG/ML IJ SOLN: 6.2500 mg | INTRAMUSCULAR | Status: DC | PRN

## 2013-04-15 MED ORDER — PROMETHAZINE HCL 25 MG/ML IJ SOLN: 12.5000 mg | Freq: Four times a day (QID) | INTRAMUSCULAR | Status: DC | PRN

## 2013-04-15 MED ORDER — ONDANSETRON HCL 4 MG/2ML IJ SOLN
INTRAMUSCULAR | Status: AC
  Filled 2013-04-15: qty 2

## 2013-04-15 MED ORDER — KETOROLAC TROMETHAMINE 30 MG/ML IJ SOLN
30.0000 mg | Freq: Four times a day (QID) | INTRAMUSCULAR | Status: AC | PRN
  Administered 2013-04-16: 30 mg via INTRAMUSCULAR

## 2013-04-15 MED ORDER — CEFAZOLIN SODIUM-DEXTROSE 2-3 GM-% IV SOLR
2.0000 g | INTRAVENOUS | Status: DC
  Filled 2013-04-15: qty 50

## 2013-04-15 MED ORDER — FENTANYL 2.5 MCG/ML BUPIVACAINE 1/10 % EPIDURAL INFUSION (WH - ANES)
INTRAMUSCULAR | Status: DC | PRN
  Administered 2013-04-15: 20:00:00
  Administered 2013-04-15: 14 mL/h via EPIDURAL

## 2013-04-15 MED ORDER — PHENYLEPHRINE 40 MCG/ML (10ML) SYRINGE FOR IV PUSH (FOR BLOOD PRESSURE SUPPORT)
80.0000 ug | PREFILLED_SYRINGE | INTRAVENOUS | Status: DC | PRN
  Administered 2013-04-15: 80 ug via INTRAVENOUS
  Filled 2013-04-15: qty 5

## 2013-04-15 MED ORDER — OXYTOCIN 10 UNIT/ML IJ SOLN
INTRAMUSCULAR | Status: AC
  Filled 2013-04-15: qty 4

## 2013-04-15 MED ORDER — MEPERIDINE HCL 25 MG/ML IJ SOLN
INTRAMUSCULAR | Status: AC
  Filled 2013-04-15: qty 1

## 2013-04-15 MED ORDER — LACTATED RINGERS IV SOLN
500.0000 mL | Freq: Once | INTRAVENOUS | Status: AC
  Administered 2013-04-15: 500 mL via INTRAVENOUS

## 2013-04-15 MED ORDER — ONDANSETRON HCL 4 MG/2ML IJ SOLN
INTRAMUSCULAR | Status: DC | PRN
  Administered 2013-04-15: 4 mg via INTRAMUSCULAR

## 2013-04-15 MED ORDER — FENTANYL CITRATE 0.05 MG/ML IJ SOLN
25.0000 ug | INTRAMUSCULAR | Status: DC | PRN
  Administered 2013-04-16 (×2): 50 ug via INTRAVENOUS

## 2013-04-15 MED ORDER — LACTATED RINGERS IV SOLN
INTRAVENOUS | Status: DC
  Administered 2013-04-15: 21:00:00 via INTRAUTERINE

## 2013-04-15 MED ORDER — LACTATED RINGERS IV SOLN
INTRAVENOUS | Status: DC | PRN
  Administered 2013-04-15: 23:00:00 via INTRAVENOUS

## 2013-04-15 MED ORDER — CEFAZOLIN SODIUM-DEXTROSE 2-3 GM-% IV SOLR
INTRAVENOUS | Status: AC
  Filled 2013-04-15: qty 50

## 2013-04-15 MED ORDER — NALBUPHINE SYRINGE 5 MG/0.5 ML
10.0000 mg | INJECTION | Freq: Once | INTRAMUSCULAR | Status: AC
  Administered 2013-04-15: 10 mg via INTRAVENOUS
  Filled 2013-04-15: qty 1

## 2013-04-15 MED ORDER — SCOPOLAMINE 1 MG/3DAYS TD PT72
MEDICATED_PATCH | TRANSDERMAL | Status: AC
  Administered 2013-04-15: 1.5 mg via TRANSDERMAL
  Filled 2013-04-15: qty 1

## 2013-04-15 MED ORDER — SCOPOLAMINE 1 MG/3DAYS TD PT72
1.0000 | MEDICATED_PATCH | Freq: Once | TRANSDERMAL | Status: DC
  Administered 2013-04-15: 1.5 mg via TRANSDERMAL

## 2013-04-15 MED ORDER — OXYTOCIN 10 UNIT/ML IJ SOLN
40.0000 [IU] | INTRAVENOUS | Status: DC | PRN
  Administered 2013-04-15: 40 [IU] via INTRAVENOUS

## 2013-04-15 MED ORDER — MORPHINE SULFATE (PF) 0.5 MG/ML IJ SOLN
INTRAMUSCULAR | Status: DC | PRN
  Administered 2013-04-15: 4 mg via EPIDURAL

## 2013-04-15 MED ORDER — SODIUM BICARBONATE 8.4 % IV SOLN
INTRAVENOUS | Status: DC | PRN
  Administered 2013-04-15: 10 mL via EPIDURAL

## 2013-04-15 MED ORDER — EPHEDRINE 5 MG/ML INJ
10.0000 mg | INTRAVENOUS | Status: DC | PRN
  Administered 2013-04-15: 10 mg via INTRAVENOUS

## 2013-04-15 MED ORDER — NALBUPHINE SYRINGE 5 MG/0.5 ML
10.0000 mg | INJECTION | INTRAMUSCULAR | Status: AC | PRN
  Administered 2013-04-15 (×2): 10 mg via INTRAVENOUS
  Filled 2013-04-15 (×2): qty 1

## 2013-04-15 MED ORDER — LACTATED RINGERS IV SOLN: INTRAVENOUS | Status: DC

## 2013-04-15 MED ORDER — ONDANSETRON HCL 4 MG/2ML IJ SOLN
4.0000 mg | Freq: Four times a day (QID) | INTRAMUSCULAR | Status: DC | PRN
  Administered 2013-04-15: 4 mg via INTRAVENOUS
  Filled 2013-04-15: qty 2

## 2013-04-15 MED ORDER — FENTANYL 2.5 MCG/ML BUPIVACAINE 1/10 % EPIDURAL INFUSION (WH - ANES)
14.0000 mL/h | INTRAMUSCULAR | Status: DC | PRN
  Filled 2013-04-15 (×2): qty 125

## 2013-04-15 MED ORDER — LACTATED RINGERS IV SOLN
INTRAVENOUS | Status: DC | PRN
  Administered 2013-04-15 (×2): via INTRAVENOUS

## 2013-04-15 MED ORDER — CEFAZOLIN SODIUM-DEXTROSE 2-3 GM-% IV SOLR
INTRAVENOUS | Status: DC | PRN
  Administered 2013-04-15: 2 g via INTRAVENOUS

## 2013-04-15 MED ORDER — MEPERIDINE HCL 25 MG/ML IJ SOLN
INTRAMUSCULAR | Status: DC | PRN
  Administered 2013-04-15 (×2): 12.5 mg via INTRAVENOUS

## 2013-04-15 MED ORDER — MIDAZOLAM HCL 2 MG/2ML IJ SOLN: 0.5000 mg | Freq: Once | INTRAMUSCULAR | Status: AC | PRN

## 2013-04-15 MED ORDER — MORPHINE SULFATE 0.5 MG/ML IJ SOLN
INTRAMUSCULAR | Status: AC
  Filled 2013-04-15: qty 10

## 2013-04-15 MED ORDER — PROMETHAZINE HCL 25 MG/ML IJ SOLN: 6.2500 mg | INTRAMUSCULAR | Status: DC | PRN

## 2013-04-15 MED ORDER — DIPHENHYDRAMINE HCL 50 MG/ML IJ SOLN: 12.5000 mg | INTRAMUSCULAR | Status: DC | PRN

## 2013-04-15 MED ORDER — KETOROLAC TROMETHAMINE 30 MG/ML IJ SOLN
INTRAMUSCULAR | Status: AC
  Administered 2013-04-16: 30 mg via INTRAMUSCULAR
  Filled 2013-04-15: qty 1

## 2013-04-15 MED ORDER — KETOROLAC TROMETHAMINE 30 MG/ML IJ SOLN
30.0000 mg | Freq: Four times a day (QID) | INTRAMUSCULAR | Status: AC | PRN
  Administered 2013-04-16: 30 mg via INTRAVENOUS
  Filled 2013-04-15: qty 1

## 2013-04-15 MED ORDER — PHENYLEPHRINE 40 MCG/ML (10ML) SYRINGE FOR IV PUSH (FOR BLOOD PRESSURE SUPPORT): 80.0000 ug | PREFILLED_SYRINGE | INTRAVENOUS | Status: DC | PRN

## 2013-04-15 MED ORDER — EPHEDRINE 5 MG/ML INJ
10.0000 mg | INTRAVENOUS | Status: DC | PRN
  Filled 2013-04-15: qty 4

## 2013-04-15 MED ORDER — LIDOCAINE HCL (PF) 1 % IJ SOLN
INTRAMUSCULAR | Status: DC | PRN
  Administered 2013-04-15 (×2): 4 mL

## 2013-04-15 SURGICAL SUPPLY — 40 items
APL SKNCLS STERI-STRIP NONHPOA (GAUZE/BANDAGES/DRESSINGS)
BENZOIN TINCTURE PRP APPL 2/3 (GAUZE/BANDAGES/DRESSINGS) IMPLANT
CLAMP CORD UMBIL (MISCELLANEOUS) IMPLANT
CLOTH BEACON ORANGE TIMEOUT ST (SAFETY) ×2 IMPLANT
CONTAINER PREFILL 10% NBF 15ML (MISCELLANEOUS) IMPLANT
DRAPE LG THREE QUARTER DISP (DRAPES) ×4 IMPLANT
DRAPE WARM FLUID 44X44 (DRAPE) IMPLANT
DRSG OPSITE POSTOP 4X10 (GAUZE/BANDAGES/DRESSINGS) ×2 IMPLANT
DURAPREP 26ML APPLICATOR (WOUND CARE) ×2 IMPLANT
ELECT REM PT RETURN 9FT ADLT (ELECTROSURGICAL) ×2
ELECTRODE REM PT RTRN 9FT ADLT (ELECTROSURGICAL) ×1 IMPLANT
EXTRACTOR VACUUM KIWI (MISCELLANEOUS) IMPLANT
EXTRACTOR VACUUM M CUP 4 TUBE (SUCTIONS) IMPLANT
GLOVE BIO SURGEON STRL SZ7 (GLOVE) ×2 IMPLANT
GLOVE BIOGEL PI IND STRL 7.0 (GLOVE) ×1 IMPLANT
GLOVE BIOGEL PI INDICATOR 7.0 (GLOVE) ×1
GOWN PREVENTION PLUS XLARGE (GOWN DISPOSABLE) ×4 IMPLANT
GOWN STRL REIN XL XLG (GOWN DISPOSABLE) ×4 IMPLANT
KIT ABG SYR 3ML LUER SLIP (SYRINGE) IMPLANT
NDL HYPO 25X5/8 SAFETYGLIDE (NEEDLE) IMPLANT
NEEDLE HYPO 25X5/8 SAFETYGLIDE (NEEDLE) IMPLANT
NS IRRIG 1000ML POUR BTL (IV SOLUTION) ×2 IMPLANT
PACK C SECTION WH (CUSTOM PROCEDURE TRAY) ×2 IMPLANT
PAD OB MATERNITY 4.3X12.25 (PERSONAL CARE ITEMS) ×2 IMPLANT
RTRCTR C-SECT PINK 25CM LRG (MISCELLANEOUS) IMPLANT
STAPLER VISISTAT 35W (STAPLE) IMPLANT
STRIP CLOSURE SKIN 1/4X4 (GAUZE/BANDAGES/DRESSINGS) IMPLANT
SUT MON AB-0 CT1 36 (SUTURE) ×6 IMPLANT
SUT PLAIN 0 NONE (SUTURE) IMPLANT
SUT PLAIN 2 0 (SUTURE)
SUT PLAIN ABS 2-0 CT1 27XMFL (SUTURE) IMPLANT
SUT VIC AB 0 CT1 27 (SUTURE) ×4
SUT VIC AB 0 CT1 27XBRD ANBCTR (SUTURE) ×2 IMPLANT
SUT VIC AB 2-0 CT1 27 (SUTURE) ×4
SUT VIC AB 2-0 CT1 TAPERPNT 27 (SUTURE) ×2 IMPLANT
SUT VIC AB 4-0 KS 27 (SUTURE) IMPLANT
SUT VICRYL 0 TIES 12 18 (SUTURE) IMPLANT
TOWEL OR 17X24 6PK STRL BLUE (TOWEL DISPOSABLE) ×2 IMPLANT
TRAY FOLEY CATH 14FR (SET/KITS/TRAYS/PACK) IMPLANT
WATER STERILE IRR 1000ML POUR (IV SOLUTION) ×2 IMPLANT

## 2013-04-15 NOTE — Progress Notes (Addendum)
Patient ID: Sarah Stokes, female   DOB: Feb 26, 1986, 27 y.o.   MRN: [id number] S: Doing well, pain well-controlled with an epidural. Hypotension resolved.  O: Filed Vitals:   04/15/13 1328 04/15/13 1330 04/15/13 1331 04/15/13 1335  BP:  136/77    Pulse: 89 89 74 86  Temp:    98.1 F (36.7 C)  TempSrc:    Oral  Resp:    18  Height:      Weight:      SpO2: 100% 100%  100%     FHT:  FHR: 140 bpm, variability: moderate,  accelerations:  Present,  decelerations:  Present early decels UC:   regular, every 1.5-3 minutes SVE:   Dilation: 4 Effacement (%): 80 Station: -1 Exam by:: Arita Miss, CNM Foley bulb placed in standard sterile fashion without difficulties / clear urine in return / patient tolerated procedure well   A / P: Induction of labor due to term with favorable cervix AROM GBS Negative  Fetal Wellbeing:  Category I Pain Control:  Epidural  Anticipated MOD:  NSVD  *Dr. Juliene Pina at bedside discussing FHR changes and plan of care with patient and family.  Kenard Gower, MSN, CNM 04/15/2013, 1:35 PM

## 2013-04-15 NOTE — Progress Notes (Signed)
TC from nursing r/t repeat prolonged decel ~0500   S:  aware of ctx - some uncomfortable (4/10 pain scale)      rested/ slept a little after Nubain        O:   VS: Blood pressure 115/48, pulse 64, temperature 98.1 F (36.7 C), temperature source Oral, resp. rate 16, height 5\' 2"  (1.575 m), weight 81.647 kg (180 lb), last menstrual period 07/15/2012.         FHR : baseline 125 / variability moderaet / accelerations +                   intermittent variable decelerations nadir 105 x 10-20 sec                  repeat prolonged variable x 4 1/2 minutes to nadir 70 with spontaneous recovery to reactive tracing w/ accels                  increasing depth and length of variables (30 sec / nadir 90) for the 5-10 minutes prior to prolonged decel                         noted same maternal position with both prolonged decels - left far lateral        Toco: contractions every 2-4 minutes / 40-70 seconds / mild to moderate         Cervix : mid-position / 1.5cm / 80% / vtx / -1 / no cord felt on exam / membranes swept        Membranes: intact  Discussed cytotec effective with single dose - do not recommend further dosing. Next step would be placement of cervical balloon with traction - anticipate balloon out in 2-4 hours with cervix dilation to 5cm. May or may not need pitocin. Plan after cervical balloon out - recheck cervix then consider AROM to assess fluid for meconium. Agrees.  Cervical balloon placed bimanually without difficulty / tolerated well by patient. Tubing secured to leg for traction.  Reviewed induction was initially elective- FHR decels have now given medical indication for induction of labor. Recommend active progression toward delivery today. Discussed with patient etiology of variable / prolonged decels related to cord compression. No pattern to decels except maternal position same with both prolonged decels - may try to avoid far lateral left side as much as possible in labor. Due to  unpredictable nature of the decels: they may worsen in active labor to point of needing emergency cesarean section OR - we may not see any further decels. Reviewed cesarean delivery in case of emergency & reasons to consider operative delivery. Recommend clear liquids in labor only and maintain continuous EFM tracings. Understands and agrees to plan. No further questions or concerns.  Discussed pain management plan -                   patient desires epidural /may opt for IV analgesia for pain prior to cervical balloon out.   A: induction of labor     category 2 tracing related to intermittent prolonged decels  P: cervical balloon with increased traction Q 2 hours until out     continuous EFM     clear liquids only     reassess for cord prior to AROM      epidural in active labor     updated Dr Cherly Hensen with status and plan of care  Marlinda Mike CNM, MSN, FACNM 04/15/2013, 6:50 AM

## 2013-04-15 NOTE — Progress Notes (Signed)
RN walked into patients room at 1754 for FHR decel to find patients family member placing non-rebreather oxygen mask on patient at 5 L. Patient's family member stated that "the heart rate was in to 50's and 60's." RN removed oxygen mask as FHR recovered and notified patient/family that oxygen is last resort intervention.. Care coordinator notified of event. Care coordinator notified CNM and CNM to speak with patient/family.

## 2013-04-15 NOTE — Anesthesia Preprocedure Evaluation (Signed)
Anesthesia Evaluation  Patient identified by MRN, date of birth, ID band Patient awake    Reviewed: Allergy & Precautions, H&P , Patient's Chart, lab work & pertinent test results  Airway Mallampati: III TM Distance: >3 FB Neck ROM: full    Dental no notable dental hx. (+) Teeth Intact   Pulmonary neg pulmonary ROS,  breath sounds clear to auscultation  Pulmonary exam normal       Cardiovascular negative cardio ROS  Rhythm:regular Rate:Normal  Hx/o Raynauds Syndrome   Neuro/Psych  Headaches, PSYCHIATRIC DISORDERS negative neurological ROS     GI/Hepatic negative GI ROS, Neg liver ROS,   Endo/Other  negative endocrine ROS  Renal/GU negative Renal ROS  negative genitourinary   Musculoskeletal   Abdominal Normal abdominal exam  (+)   Peds  Hematology negative hematology ROS (+)   Anesthesia Other Findings   Reproductive/Obstetrics (+) Pregnancy                           Anesthesia Physical Anesthesia Plan  ASA: II  Anesthesia Plan: Epidural   Post-op Pain Management:    Induction:   Airway Management Planned:   Additional Equipment:   Intra-op Plan:   Post-operative Plan:   Informed Consent: I have reviewed the patients History and Physical, chart, labs and discussed the procedure including the risks, benefits and alternatives for the proposed anesthesia with the patient or authorized representative who has indicated his/her understanding and acceptance.     Plan Discussed with: Anesthesiologist  Anesthesia Plan Comments:         Anesthesia Quick Evaluation

## 2013-04-15 NOTE — Progress Notes (Signed)
Patient ID: Sarah Stokes, female   DOB: 1985/11/19, 27 y.o.   MRN: [id number] Subjective: Hip pain continues but better with epidural.   Objective: BP 119/73  Pulse 38  Temp(Src) 98.1 F (36.7 C) (Oral)  Resp 18  Ht 5\' 2"  (1.575 m)  Wt 180 lb (81.647 kg)  BMI 32.91 kg/m2  SpO2 100%  LMP 07/15/2012  FHT:   FHR: 140s/ bpm, variability: moderate,  accelerations:  Present,  decelerations:  Present recurrent late decels but with good variability UC:   regular, every 2-3 minutes spontaneous labor SVE:   Dilation: 7 Effacement (%): 90 Station: -1 Exam by:: dr. Juliene Pina Cervical dilatation essentially unchanged with caput getting longer and persistent ROT position. Hematuria noted  Assessment / Plan: Arrest in active phase of labor  Fetal Wellbeing:  Category II but improved intermittently  Pain Control:  Epidural  Anticipated MOD:  proceed with cesarean section Risks/complications of surgery reviewed incl infection, bleeding, damage to internal organs including bladder, bowels, ureters, blood vessels, other risks from anesthesia, VTE and delayed complications of any surgery, complications in future surgery reviewed. Also discussed neonatal complications incl difficult delivery, laceration, vacuum assistance, TTN etc. Pt understands and agrees, all concerns addressed.     Jeane Cashatt R 04/15/2013, 10:08 PM

## 2013-04-15 NOTE — Op Note (Signed)
Sarah Stokes  04/15/2013 Procedure Note:  Primary Low Transverse Cesarean Section   Indications: Arrest of dilatation, fetal intolerance to labor, peristent ROT position   Pre-operative Diagnosis: failure to progress and fetal distress.   Post-operative Diagnosis: Same   Surgeon: Robley Fries, MD   Assistants: Raelyn Mora, CNM  Anesthesia: epidural   Procedure Details:  The patient was seen in the labor room. She was recommended C/section delivery due to arrest of dilatation, persistent right occiput transverse position and fetal intolerance to labor. The risks, benefits, complications, treatment options, and expected outcomes were discussed with the patient. The patient concurred with the proposed plan, giving informed consent. identified as Sarah Stokes and the procedure verified as C-Section Delivery.  She was brought to the Operating Room and a Time Out was held and the above information confirmed. FHR was down in 100s in the Operating room.  After induction of anesthesia, the patient was draped and prepped in the usual sterile manner. A Pfannenstiel incision was made and carried down through the subcutaneous tissue to the fascia. Fascial incision was made and extended transversely. The fascia was separated from the underlying rectus tissue superiorly and inferiorly. The peritoneum was identified and entered. Peritoneal incision was extended longitudinally. The utero-vesical peritoneal reflection was incised transversely and the bladder flap was bluntly freed from the lower uterine segment. A low transverse uterine incision was made. Large amount of amnio-infusion fluid was drained. Delivered from cephalic right occiput transverse position was healthy vigorous Female infant at 10.38 pm on 04/15/13 with Apgar scores of 8 at one minute and 8 at five minutes. Cord ph was not sent the umbilical cord was clamped and cut cord blood was obtained for evaluation. Baby handed to NICU team. The  placenta was removed Intact and appeared normal. The uterine outline, tubes and ovaries appeared normal. The uterine incision was closed in 2 layers with running locked sutures of 0Vicryl followed by another imbricating layer.   Hemostasis was observed. Peritoneal closure done with 2-0 Vicryl. The fascia was then reapproximated with running sutures of 0Vicryl. The subcuticular closure was performed using 2-0plain gut. The skin was closed with 4-0Vicryl. Steri-strips, honey comb and pressure dressing placed.   Instrument, sponge, and needle counts were correct prior the abdominal closure and were correct at the conclusion of the case.   Findings: Female infant was delivered from ROT position cephalic at 10.38 pm on 04/15/13. Large caput noted. Clear amniotic fluid. Apgars 8 and 8 at 1 and 5 min. Normal uterus, tubes and ovaries. Normal cord and placenta.     Estimated Blood Loss: 650 cc  Total IV Fluids: 1800 cc LR  Urine Output: 100 cc bloody but cleared at the end of surgery  Specimens: Cord blood  Complications: no complications  Disposition: PACU - hemodynamically stable.   Maternal Condition: stable   Baby condition / location:  nursery-stable  Attending Attestation: I performed the procedure.   Signed: Surgeon(s): Robley Fries, MD

## 2013-04-15 NOTE — Progress Notes (Addendum)
Patient ID: Sarah Stokes, female   DOB: 1986/02/02, 27 y.o.   MRN: [id number] TC from Charge RN concerning family member placing O2 via FM without knowledge of L&D staff or CNM, because the patient "felt nauseated and the baby's heart rate was down in the 50's." S: Doing well, pain well-controlled with an epidural. Reports LLQ pain seemingly d/t IUPC. Mild pelvic pressure.   O: Filed Vitals:   04/15/13 1855 04/15/13 1900 04/15/13 1901 04/15/13 1905  BP:  111/61    Pulse: 78 71 78 80  Temp:      TempSrc:      Resp:  18    Height:      Weight:      SpO2: 100% 99%  100%     FHT:  FHR: 125 bpm, variability: moderate,  accelerations:  Present,  decelerations:  Present occasional late decels UC:   regular, every 2-4 minutes SVE:   Dilation: 6.5 Effacement (%): 90 Station: -2 Exam by:: Arita Miss, CNM   A / P: Induction of labor due to term with favorable cervix,  progressing well AROM GBS Negative  IUPC Amnioinfusion Fetal Wellbeing:  Category II Pain Control:  Epidural  Anticipated MOD:  Hopeful for a NSVD  Discussed with patient and family the importance of allowing L&D staff and providers to perform medical interventions / patient and family reassured that L&D staff are providing timely and appropriate care.  * Consult with Dr. Juliene Pina - will come talk to patient and family.  Kenard Gower, MSN, CNM 04/15/2013, 6:24 PM

## 2013-04-15 NOTE — Progress Notes (Deleted)
S: Doing well, LLQ and Lt hip pain worsening with minimal control with an epidural.   O: Filed Vitals:   04/15/13 2001 04/15/13 2030 04/15/13 2100 04/15/13 2130  BP:  121/66 112/63 119/73  Pulse: 69 64 65 38  Temp:    98.1 F (36.7 C)  TempSrc:    Oral  Resp:      Height:      Weight:      SpO2:    100%     FHT:  FHR: 125 bpm, variability: moderate,  accelerations:  Present,  decelerations:  Present variables UC:   regular, every 2-4 minutes SVE:   Dilation: 6.5 Effacement (%): 90 Station: -1 Exam by:: Dr. Juliene Pina   A / P: Induction of labor due to term with favorable cervix Protracted active labor AROM GBS Negative  Fetal Wellbeing:  Category II Pain Control:  Epidural  Anticipated MOD:  discussed the possibility of cesarean delivery - dependent on cervical change  *Dr. Juliene Pina at bedside discussing plan of care and possibility of cesarean delivery with patient and family  Sarah Stokes, M, MSN, CNM 04/15/2013, 7:00 PM

## 2013-04-15 NOTE — Progress Notes (Signed)
Patient ID: Sarah Stokes, female   DOB: [birth year], 27 y.o.   MRN: [id number] S: Doing well, sharp LLQ pain, radiating from Lt hip area / LLQ pain not well-managed with epidural.    O: Filed Vitals:   04/15/13 1328 04/15/13 1330 04/15/13 1331 04/15/13 1335  BP:  136/77    Pulse: 89 89 74 86  Temp:    98.1 F (36.7 C)  TempSrc:    Oral  Resp:    18  Height:      Weight:      SpO2: 100% 100%  100%     FHT:  FHR: 130 bpm, variability: moderate,  accelerations:  Present,  decelerations:  Present variables UC:   regular, every 2-4 minutes SVE:   Dilation: 4 Effacement (%): 80 Station: -1 Exam by:: Arita Miss, CNM   A / P: Induction of labor due to term with favorable cervix AROM GBS Negative Position to maternal Lt  Fetal Wellbeing:  Category I Pain Control:  Epidural  Anticipated MOD:  NSVD  Kenard Gower, MSN, CNM 04/15/2013, 2:05 PM

## 2013-04-15 NOTE — Progress Notes (Addendum)
Patient ID: Sarah Stokes, female   DOB: 10-Apr-1986, 27 y.o.   MRN: [id number] S: Doing well, pain well-controlled with an epidural.  Asymptomatic hypotension after epidural.   O: Filed Vitals:   04/15/13 1304 04/15/13 1305 04/15/13 1309 04/15/13 1310  BP: 119/72 124/86  131/62  Pulse: 70 63 72 74  Temp:      TempSrc:      Resp:    18  Height:      Weight:      SpO2: 100%  100%      FHT:  FHR: 130 bpm, variability: moderate,  accelerations:  Present,  decelerations:  Present prolonged variable decel s/p epidural placement UC:   regular, every 2-3 minutes SVE:   Dilation: 4 Effacement (%): 80 Station: -1 Exam by:: Felkel, RN   A / P: Induction of labor due to term with favorable cervix  Fetal Wellbeing:  Category I and Category II Pain Control:  Epidural  Anticipated MOD:  NSVD  *Dr. Juliene Pina made aware of FHR - agrees with plan  Kenard Gower, MSN, CNM 04/15/2013, 1:13 PM

## 2013-04-15 NOTE — Progress Notes (Signed)
TC from RN - Foley bulb out.  Anterior cx: 4/80/-1; vtx.  FHR category 1. Pt requesting epidural - order given.  Kenard Gower, MSN, CNM 04/15/2013, 11:30 AM

## 2013-04-15 NOTE — Transfer of Care (Signed)
Immediate Anesthesia Transfer of Care Note  Patient: Sarah Stokes  Procedure(s) Performed: Procedure(s): CESAREAN SECTION (N/A)  Patient Location: PACU  Anesthesia Type:Epidural  Level of Consciousness: awake, alert  and oriented  Airway & Oxygen Therapy: Patient Spontanous Breathing  Post-op Assessment: Report given to PACU RN and Post -op Vital signs reviewed and stable  Post vital signs: Reviewed and stable  Complications: No apparent anesthesia complications

## 2013-04-15 NOTE — Anesthesia Postprocedure Evaluation (Signed)
  Anesthesia Post Note  Patient: Sarah Stokes  Procedure(s) Performed: Procedure(s) (LRB): CESAREAN SECTION (N/A)  Anesthesia type: Epidural  Patient location: PACU  Post pain: Pain level controlled  Post assessment: Post-op Vital signs reviewed  Last Vitals:  Filed Vitals:   04/15/13 2335  BP: 139/83  Pulse: 96  Temp: 36.8 C  Resp: 22    Post vital signs: Reviewed  Level of consciousness: awake  Complications: No apparent anesthesia complications

## 2013-04-15 NOTE — Progress Notes (Signed)
   VS: Blood pressure 119/67, pulse 74, temperature 97.9 F (36.6 C), temperature source Oral, resp. rate 16, height 5\' 2"  (1.575 m), weight 81.647 kg (180 lb), last menstrual period 07/15/2012.        FHR : baseline 145 / variability moderate / accelerations + / occasional mild variable decelerations x10-15 sec        Toco: contractions every 2-4 minutes - mild to moderate         No repetitive decels / some difficulty in monitoring due to active FM / single prolonged variable decel earlier  A: induction of labor     FHR category 1  P: no further cytotec - consider balloon at next exam      will give nubain instead of vistaril for shorter duration of effect      IVF of LR at 167ml/hr      continuous EFM tonight     Marlinda Mike CNM, MSN, FACNM 04/15/2013, 1215am

## 2013-04-15 NOTE — Anesthesia Procedure Notes (Signed)
Epidural Patient location during procedure: OB Start time: 04/15/2013 12:45 PM  Staffing Anesthesiologist: Trevon Strothers A. Performed by: anesthesiologist   Preanesthetic Checklist Completed: patient identified, site marked, surgical consent, pre-op evaluation, timeout performed, IV checked, risks and benefits discussed and monitors and equipment checked  Epidural Patient position: sitting Prep: site prepped and draped and DuraPrep Patient monitoring: continuous pulse ox and blood pressure Approach: midline Injection technique: LOR air  Needle:  Needle type: Tuohy  Needle gauge: 17 G Needle length: 9 cm and 9 Needle insertion depth: 4 cm Catheter type: closed end flexible Catheter size: 19 Gauge Catheter at skin depth: 9 cm Test dose: negative and Other  Assessment Events: blood not aspirated, injection not painful, no injection resistance, negative IV test and no paresthesia  Additional Notes Patient identified. Risks and benefits discussed including failed block, incomplete  Pain control, post dural puncture headache, nerve damage, paralysis, blood pressure Changes, nausea, vomiting, reactions to medications-both toxic and allergic and post Partum back pain. All questions were answered. Patient expressed understanding and wished to proceed. Sterile technique was used throughout procedure. Epidural site was Dressed with sterile barrier dressing. No paresthesias, signs of intravascular injection Or signs of intrathecal spread were encountered.  Patient was more comfortable after the epidural was dosed. Please see RN's note for documentation of vital signs and FHR which are stable.

## 2013-04-15 NOTE — Progress Notes (Signed)
Patient ID: Sarah Stokes, female   DOB: [birth year], 27 y.o.   MRN: [id number]  TC from unit secretary - patient requesting to have cervix examined.  S: Doing well, pain well-controlled with an epidural. Some Lt hip pain from previous hip/knee surgery.   O: Filed Vitals:   04/15/13 1415 04/15/13 1420 04/15/13 1545 04/15/13 1550  BP:   134/82   Pulse: 66 65 82 72  Temp:      TempSrc:      Resp:      Height:      Weight:      SpO2: 100% 99% 100% 100%     FHT:  FHR: 125 bpm, variability: moderate,  accelerations:  Present,  decelerations:  Present variables UC:   regular, every 3-4 minutes SVE:   Dilation: 5.5 Effacement (%): 80 Station: -1 Exam by:: Amaan Meyer,CNM   A / P: Induction of labor due to term with favorable cervix, progressing well AROM GBS Negative  Place IUPC Amnioinfusion - bolus then 134mL/hr  Fetal Wellbeing:  Category I and Category II Pain Control:  Epidural  Anticipated MOD:  NSVD  *Dr. Juliene Pina updated on pt status - agrees with plan.  Kenard Gower, MSN, CNM 04/15/2013, 4:08 PM

## 2013-04-15 NOTE — Progress Notes (Signed)
Patient ID: Sarah Stokes, female   DOB: 08/22/1985, 28 y.o.   MRN: [id number] S: Doing well, LLQ pain increasing and constant,  Epidural infusing. (+) pelvic pressure.   O: Filed Vitals:   04/15/13 1950 04/15/13 1955 04/15/13 2000 04/15/13 2001  BP:   115/55   Pulse: 70 74 69 69  Temp:      TempSrc:      Resp:      Height:      Weight:      SpO2: 99% 99% 100%      FHT:  FHR: 125 bpm, variability: moderate,  accelerations:  Present,  decelerations:  Present variables UC:   regular, every 1-4 minutes SVE:   Dilation: 6.5 Effacement (%): 90 Station: -1 Exam by:: Dr. Juliene Pina   A / P: Induction of labor due to term with favorable cervix Protracted active phase of labor AROM GBS Negative  Fetal Wellbeing:  Category II Pain Control:  Epidural  Anticipated MOD:  NSVD - will re-evaluate in 2 hours  Possibility of cesarean delivery d/t failed progression, failure to descend and fetal distress discussed with pt by Dr. Juliene Pina.  *Plan co-managed with Dr. Aura Fey, Terence Lux, MSN, CNM 04/15/2013, 8:00 PM

## 2013-04-15 NOTE — Progress Notes (Addendum)
S: Doing well, LLQ and Lt hip pain worsening with minimal control with an epidural.   O: Filed Vitals:   04/15/13 2001 04/15/13 2030 04/15/13 2100 04/15/13 2130  BP:  121/66 112/63 119/73  Pulse: 69 64 65 38  Temp:    98.1 F (36.7 C)  TempSrc:    Oral  Resp:      Height:      Weight:      SpO2:    100%     FHT:  FHR: 125 bpm, variability: moderate,  accelerations:  Present,  decelerations:  Present variables UC:   regular, every 2-4 minutes SVE:   Dilation: 7 Effacement (%): 90 Station: -1 Exam by:: dr. Juliene Pina   A / P: Induction of labor due to term with favorable cervix Protracted active labor AROM GBS Negative  Fetal Wellbeing:  Category II Pain Control:  Epidural IUPC - MVUs 200s Amnioinfusion Anticipated MOD:  discussed the possibility of cesarean delivery - dependent on cervical change  *Dr. Juliene Pina at bedside discussing plan of care and possibility of cesarean delivery with patient and family  Sarah Stokes, M, MSN, CNM 04/15/2013, 7:00 PM

## 2013-04-15 NOTE — Progress Notes (Signed)
Sarah Stokes is a 27 y.o. G1P0 at [redacted]w[redacted]d IOL for intractable left hip pain (prior bone grafting from left hip to left knee x 2 surgeries) IOL, currently spontaneous UCs. FHT reviewed, off and on decels but quick recovery and return to category I hence continue to watch progress.  SVE:   Dilation: 6.5 Effacement (%): 90 Station: -1 Exam by:: Dr. Juliene Stokes Caput +, cervix swollen, -2, ROT Turn to exaggerated left sims and assess progress. Work with managing left hip pain since getting worse.   Assessment / Plan: Assess for possible protracted labor and persistant ROT. Reviewed with patient findings, FHT category I and continue to assess progress.   Sarah Stokes R 04/15/2013, 8:27 PM

## 2013-04-16 ENCOUNTER — Encounter (HOSPITAL_COMMUNITY): Payer: Self-pay

## 2013-04-16 LAB — CBC
HCT: 31.5 % — ABNORMAL LOW (ref 36.0–46.0)
Hemoglobin: 10.4 g/dL — ABNORMAL LOW (ref 12.0–15.0)
MCH: 27.7 pg (ref 26.0–34.0)
MCV: 84 fL (ref 78.0–100.0)
Platelets: 189 10*3/uL (ref 150–400)
RBC: 3.75 MIL/uL — ABNORMAL LOW (ref 3.87–5.11)
WBC: 19.6 10*3/uL — ABNORMAL HIGH (ref 4.0–10.5)

## 2013-04-16 MED ORDER — NALBUPHINE HCL 10 MG/ML IJ SOLN
5.0000 mg | INTRAMUSCULAR | Status: DC | PRN
  Filled 2013-04-16: qty 1

## 2013-04-16 MED ORDER — IBUPROFEN 600 MG PO TABS
600.0000 mg | ORAL_TABLET | Freq: Four times a day (QID) | ORAL | Status: DC
  Administered 2013-04-16 – 2013-04-17 (×5): 600 mg via ORAL
  Filled 2013-04-16 (×5): qty 1

## 2013-04-16 MED ORDER — MENTHOL 3 MG MT LOZG: 1.0000 | LOZENGE | OROMUCOSAL | Status: DC | PRN

## 2013-04-16 MED ORDER — LANOLIN HYDROUS EX OINT: 1.0000 "application " | TOPICAL_OINTMENT | CUTANEOUS | Status: DC | PRN

## 2013-04-16 MED ORDER — SIMETHICONE 80 MG PO CHEW
80.0000 mg | CHEWABLE_TABLET | ORAL | Status: DC
  Administered 2013-04-16: 80 mg via ORAL

## 2013-04-16 MED ORDER — DIPHENHYDRAMINE HCL 25 MG PO CAPS: 25.0000 mg | ORAL_CAPSULE | ORAL | Status: DC | PRN

## 2013-04-16 MED ORDER — TETANUS-DIPHTH-ACELL PERTUSSIS 5-2.5-18.5 LF-MCG/0.5 IM SUSP
0.5000 mL | Freq: Once | INTRAMUSCULAR | Status: AC
  Administered 2013-04-16: 0.5 mL via INTRAMUSCULAR
  Filled 2013-04-16: qty 0.5

## 2013-04-16 MED ORDER — INFLUENZA VAC SPLIT QUAD 0.5 ML IM SUSP
0.5000 mL | INTRAMUSCULAR | Status: AC
  Administered 2013-04-16: 0.5 mL via INTRAMUSCULAR
  Filled 2013-04-16: qty 0.5

## 2013-04-16 MED ORDER — NALOXONE HCL 0.4 MG/ML IJ SOLN: 0.4000 mg | INTRAMUSCULAR | Status: DC | PRN

## 2013-04-16 MED ORDER — ONDANSETRON HCL 4 MG PO TABS: 4.0000 mg | ORAL_TABLET | ORAL | Status: DC | PRN

## 2013-04-16 MED ORDER — DIPHENHYDRAMINE HCL 50 MG/ML IJ SOLN: 25.0000 mg | INTRAMUSCULAR | Status: DC | PRN

## 2013-04-16 MED ORDER — NALOXONE HCL 1 MG/ML IJ SOLN
1.0000 ug/kg/h | INTRAVENOUS | Status: DC | PRN
  Filled 2013-04-16: qty 2

## 2013-04-16 MED ORDER — SENNOSIDES-DOCUSATE SODIUM 8.6-50 MG PO TABS
2.0000 | ORAL_TABLET | ORAL | Status: DC
  Administered 2013-04-16: 2 via ORAL

## 2013-04-16 MED ORDER — METOCLOPRAMIDE HCL 5 MG/ML IJ SOLN: 10.0000 mg | Freq: Three times a day (TID) | INTRAMUSCULAR | Status: DC | PRN

## 2013-04-16 MED ORDER — ONDANSETRON HCL 4 MG/2ML IJ SOLN: 4.0000 mg | Freq: Four times a day (QID) | INTRAMUSCULAR | Status: DC | PRN

## 2013-04-16 MED ORDER — ONDANSETRON HCL 4 MG/2ML IJ SOLN: 4.0000 mg | INTRAMUSCULAR | Status: DC | PRN

## 2013-04-16 MED ORDER — SIMETHICONE 80 MG PO CHEW: 80.0000 mg | CHEWABLE_TABLET | ORAL | Status: DC | PRN

## 2013-04-16 MED ORDER — DIPHENHYDRAMINE HCL 25 MG PO CAPS: 25.0000 mg | ORAL_CAPSULE | Freq: Four times a day (QID) | ORAL | Status: DC | PRN

## 2013-04-16 MED ORDER — DIPHENHYDRAMINE HCL 12.5 MG/5ML PO ELIX
12.5000 mg | ORAL_SOLUTION | Freq: Four times a day (QID) | ORAL | Status: DC | PRN
  Filled 2013-04-16: qty 5

## 2013-04-16 MED ORDER — SODIUM CHLORIDE 0.9 % IJ SOLN: 3.0000 mL | INTRAMUSCULAR | Status: DC | PRN

## 2013-04-16 MED ORDER — WITCH HAZEL-GLYCERIN EX PADS: 1.0000 "application " | MEDICATED_PAD | CUTANEOUS | Status: DC | PRN

## 2013-04-16 MED ORDER — DIPHENHYDRAMINE HCL 50 MG/ML IJ SOLN: 12.5000 mg | Freq: Four times a day (QID) | INTRAMUSCULAR | Status: DC | PRN

## 2013-04-16 MED ORDER — OXYTOCIN 40 UNITS IN LACTATED RINGERS INFUSION - SIMPLE MED: 62.5000 mL/h | INTRAVENOUS | Status: AC

## 2013-04-16 MED ORDER — LACTATED RINGERS IV SOLN: INTRAVENOUS | Status: DC

## 2013-04-16 MED ORDER — PRENATAL MULTIVITAMIN CH
1.0000 | ORAL_TABLET | Freq: Every day | ORAL | Status: DC
  Administered 2013-04-16 – 2013-04-17 (×2): 1 via ORAL
  Filled 2013-04-16 (×2): qty 1

## 2013-04-16 MED ORDER — HYDROCORTISONE 1 % EX CREA
TOPICAL_CREAM | Freq: Two times a day (BID) | CUTANEOUS | Status: DC
  Administered 2013-04-16 – 2013-04-17 (×2): via TOPICAL
  Filled 2013-04-16: qty 28

## 2013-04-16 MED ORDER — SIMETHICONE 80 MG PO CHEW
80.0000 mg | CHEWABLE_TABLET | Freq: Three times a day (TID) | ORAL | Status: DC
  Administered 2013-04-16 – 2013-04-17 (×5): 80 mg via ORAL

## 2013-04-16 MED ORDER — ONDANSETRON HCL 4 MG/2ML IJ SOLN: 4.0000 mg | Freq: Three times a day (TID) | INTRAMUSCULAR | Status: DC | PRN

## 2013-04-16 MED ORDER — FENTANYL CITRATE 0.05 MG/ML IJ SOLN
INTRAMUSCULAR | Status: AC
  Administered 2013-04-16: 50 ug via INTRAVENOUS
  Filled 2013-04-16: qty 2

## 2013-04-16 MED ORDER — DIPHENHYDRAMINE HCL 50 MG/ML IJ SOLN
12.5000 mg | INTRAMUSCULAR | Status: DC | PRN
  Administered 2013-04-16: 03:00:00 via INTRAVENOUS
  Filled 2013-04-16: qty 1

## 2013-04-16 MED ORDER — DIBUCAINE 1 % RE OINT: 1.0000 "application " | TOPICAL_OINTMENT | RECTAL | Status: DC | PRN

## 2013-04-16 MED ORDER — SODIUM CHLORIDE 0.9 % IJ SOLN: 9.0000 mL | INTRAMUSCULAR | Status: DC | PRN

## 2013-04-16 MED ORDER — HYDROMORPHONE 0.3 MG/ML IV SOLN
INTRAVENOUS | Status: DC
  Administered 2013-04-16: 03:00:00 via INTRAVENOUS
  Administered 2013-04-16: 1.3 mg via INTRAVENOUS
  Filled 2013-04-16: qty 25

## 2013-04-16 NOTE — Progress Notes (Signed)
POSTOPERATIVE DAY # 1 S/P CS   S:         Reports feeling tired but overall well             Tolerating po intake / no nausea / no vomiting / no flatus / no BM             Bleeding is light             Pain controlled with motrin and long-acting narcotic             Up ad lib / ambulatory/ voiding QS  Newborn breast feeding  / Circumcision planned   O:  VS: BP 108/72  Pulse 61  Temp(Src) 98.1 F (36.7 C) (Oral)  Resp 16  Ht 5\' 2"  (1.575 m)  Wt 81.647 kg (180 lb)  BMI 32.91 kg/m2  SpO2 96%  LMP 07/15/2012   LABS:               Recent Labs  04/14/13 2015 04/16/13 0555  WBC 12.8* 19.6*  HGB 11.7* 10.4*  PLT 246 189               Bloodtype: O/Positive/-- (03/12 0000)  Rubella: Immune (03/12 0000)                                             I&O: Intake/Output     10/06 0701 - 10/07 0700 10/07 0701 - 10/08 0700   P.O. 200    I.V. (mL/kg) 1925 (23.6)    Total Intake(mL/kg) 2125 (26)    Urine (mL/kg/hr) 850 (0.4)    Blood 650 (0.3)    Total Output 1500     Net +625                       Physical Exam:             Alert and Oriented X3  Lungs: Clear and unlabored  Heart: regular rate and rhythm / no mumurs  Abdomen: soft, non-tender, non-distended hypoactive BS             Fundus: firm, non-tender, Ueven             Dressing intact with scant dried blood (pressure dressing in place)            Perineum: no edema  Lochia: light  Extremities: 1+ edema, no calf pain or tenderness, SCD in place  A:        POD # 1 S/P CS              P:        Routine postoperative care              Advance postoperative course    Marlinda Mike CNM, MSN, [facility] 04/16/2013, 9:42 AM

## 2013-04-16 NOTE — Anesthesia Postprocedure Evaluation (Signed)
  Anesthesia Post-op Note  Anesthesia Post Note  Patient: Sarah Stokes  Procedure(s) Performed: Procedure(s) (LRB): CESAREAN SECTION (N/A)  Anesthesia type: Spinal  Patient location: Mother/Baby  Post pain: Pain level controlled  Post assessment: Post-op Vital signs reviewed  Last Vitals:  Filed Vitals:   04/16/13 0618  BP: 108/72  Pulse: 61  Temp:   Resp:     Post vital signs: Reviewed  Level of consciousness: awake  Complications: No apparent anesthesia complications

## 2013-04-16 NOTE — Progress Notes (Signed)
Patient was referred for history of depression/anxiety.  * Referral screened out by Clinical Social Worker because none of the following criteria appear to apply:  ~ History of anxiety/depression during this pregnancy, or of post-partum depression.  ~ Diagnosis of anxiety and/or depression within last 4 years, per chart review.  ~ History of depression due to pregnancy loss/loss of child  OR  * Patient's symptoms currently being treated with medication and/or therapy.  Please contact the Clinical Social Worker if needs arise, or by the patient's request.   

## 2013-04-17 ENCOUNTER — Encounter (HOSPITAL_COMMUNITY): Payer: Self-pay | Admitting: Obstetrics & Gynecology

## 2013-04-17 LAB — BIRTH TISSUE RECOVERY COLLECTION (PLACENTA DONATION)

## 2013-04-17 MED ORDER — TRIAMCINOLONE ACETONIDE 0.5 % EX OINT: TOPICAL_OINTMENT | Freq: Two times a day (BID) | CUTANEOUS | Status: DC

## 2013-04-17 MED ORDER — IBUPROFEN 600 MG PO TABS: 600.0000 mg | ORAL_TABLET | Freq: Four times a day (QID) | ORAL | Status: DC

## 2013-04-17 NOTE — Progress Notes (Signed)
Patient ID: Sarah Stokes, female   DOB: 01-Aug-1985, 27 y.o.   MRN: [id number] Subjective: POD# 2 Information for the patient's newborn:  Blaise, Palladino [[id number]]  female  / circ done  Reports feeling well, ready for an early discharge Feeding: breast without difficulties Patient reports tolerating PO.  Breast symptoms: none Pain controlled with ibuprofen (OTC) Denies HA/SOB/C/P/N/V/dizziness. Flatus present. She reports vaginal bleeding as normal, without clots.  She is ambulating, urinating without difficult.     Objective:   VS:  Filed Vitals:   04/16/13 1230 04/16/13 1630 04/16/13 2124 04/17/13 0610  BP: 109/69 106/64 114/65 104/67  Pulse: 76 65 98 68  Temp: 97.8 F (36.6 C) 98.2 F (36.8 C) 97.9 F (36.6 C) 97.6 F (36.4 C)  TempSrc:    Oral  Resp:    18  Height:      Weight:      SpO2:   96% 97%     Intake/Output Summary (Last 24 hours) at 04/17/13 1129 Last data filed at 04/16/13 1630  Gross per 24 hour  Intake      0 ml  Output    600 ml  Net   -600 ml        Recent Labs  04/14/13 2015 04/16/13 0555  WBC 12.8* 19.6*  HGB 11.7* 10.4*  HCT 35.2* 31.5*  PLT 246 189     Blood type: O/Positive/-- (03/12 0000)  Rubella: Immune (03/12 0000)     Physical Exam:  General: alert, cooperative and no distress CV: regular rate and rhythm, S1S2 present or without murmur or extra heart sounds Resp: clear Abdomen: soft, nontender, normal bowel sounds Incision: dried blood, borders marked drainage present Uterine Fundus: firm, below umbilicus, nontender Lochia: minimal Ext: trace edema , Homans sign is negative, no sign of DVT and no redness or tenderness in the calves or thighs      Assessment/Plan: 27 y.o.   POD# 2.  s/p Cesarean Delivery.  Indications: failure to progress, fetal distress and failure to descend                Principal Problem:   Postpartum care following cesarean delivery (10/6) Active Problems:   Pregnancy related hip pain in  third trimester   Fetal heart rate decelerations affecting management of mother - induction of labor   Status post primary low transverse cesarean section  Doing well, stable.              Regular diet as tolerated Routine post-op care Early discharge today  Kenard Gower, MSN, CNM 04/17/2013, 11:29 AM

## 2013-04-17 NOTE — Discharge Summary (Signed)
Reviewed and agree with note and plan. V.Braylon Lemmons, MD  

## 2013-04-17 NOTE — Discharge Summary (Signed)
Obstetric Discharge Summary Reason for Admission: induction of labor Prenatal Procedures: ultrasound Intrapartum Procedures: cesarean: low cervical, transverse Postpartum Procedures: none Complications-Operative and Postpartum: none Hemoglobin  Date Value Range Status  04/16/2013 10.4* 12.0 - 15.0 g/dL Final     HCT  Date Value Range Status  04/16/2013 31.5* 36.0 - 46.0 % Final    Physical Exam:  General: alert, cooperative and no distress Lochia: appropriate Uterine Fundus: firm Incision: slight, dried blood on honeycomb dressing, borders marked DVT Evaluation: No evidence of DVT seen on physical exam. Negative Homan's sign. No cords or calf tenderness. Calf/Ankle trace edema is present.  Discharge Diagnoses: Primary Cesarean delivery for failure to progress, failure to descend and fetal distress  Discharge Information: Date: 04/17/2013 Activity: pelvic rest and no driving for 2 weeks Diet: routine Medications: PNV and Ibuprofen Condition: stable Instructions: refer to practice specific booklet Discharge to: home Follow-up Information   Follow up with Marlinda Mike, CNM. Schedule an appointment as soon as possible for a visit in 6 weeks.   Specialty:  Obstetrics and Gynecology   Contact information:   [address] [place] [address] [phone]       Newborn Data: Live born female on 04/15/2013 Birth Weight: 6 lb 14.2 oz (3125 g) APGAR: 8, 8  Home with mother.  Kenard Gower, MSN, CNM 04/17/2013, 11:37 AM

## 2013-04-18 ENCOUNTER — Encounter (HOSPITAL_COMMUNITY): Payer: Self-pay | Admitting: Obstetrics & Gynecology

## 2013-04-18 NOTE — Addendum Note (Signed)
Addendum created 04/18/13 2046 by Eusebio Friendly., MD   Modules edited: Anesthesia Events

## 2013-04-21 ENCOUNTER — Inpatient Hospital Stay (HOSPITAL_COMMUNITY): Admission: AD | Admit: 2013-04-21 | Payer: Self-pay | Source: Ambulatory Visit | Admitting: Obstetrics and Gynecology

## 2013-05-13 ENCOUNTER — Ambulatory Visit: Payer: BC Managed Care – PPO | Admitting: Family Medicine

## 2013-05-16 ENCOUNTER — Ambulatory Visit (INDEPENDENT_AMBULATORY_CARE_PROVIDER_SITE_OTHER): Payer: BC Managed Care – PPO | Admitting: Family Medicine

## 2013-05-16 ENCOUNTER — Encounter: Payer: Self-pay | Admitting: Family Medicine

## 2013-05-16 VITALS — BP 112/74 | HR 59 | Temp 98.2°F | Resp 16 | Wt 166.1 lb

## 2013-05-16 DIAGNOSIS — F988 Other specified behavioral and emotional disorders with onset usually occurring in childhood and adolescence: Secondary | ICD-10-CM

## 2013-05-16 MED ORDER — LISDEXAMFETAMINE DIMESYLATE 70 MG PO CAPS: 70.0000 mg | ORAL_CAPSULE | Freq: Every morning | ORAL | Status: DC

## 2013-05-16 NOTE — Assessment & Plan Note (Signed)
Restart Vyvanse.  Reviewed supportive care and red flags that should prompt return.  Pt expressed understanding and is in agreement w/ plan.

## 2013-05-16 NOTE — Progress Notes (Signed)
  Subjective:    Patient ID: Sarah Stokes, female    DOB: Sep 02, 1985, 27 y.o.   MRN: [id number]  HPI ADD- chronic problem, stopped meds while pregnant.  No longer breastfeeding.  Interested in restarting Vyvanse 70mg .  No hx of insomnia, palpitations, anxiety.   Review of Systems For ROS see HPI     Objective:   Physical Exam  Vitals reviewed. Constitutional: She is oriented to person, place, and time. She appears well-developed and well-nourished. No distress.  HENT:  Head: Normocephalic and atraumatic.  Cardiovascular: Normal rate, regular rhythm and normal heart sounds.   Pulmonary/Chest: Effort normal and breath sounds normal. No respiratory distress. She has no wheezes. She has no rales.  Neurological: She is alert and oriented to person, place, and time.  Skin: Skin is warm and dry.          Assessment & Plan:

## 2013-05-16 NOTE — Patient Instructions (Signed)
Schedule your complete physical in 6 months Restart the Vyvanse daily Call with any questions or concerns CONGRATS!!!

## 2013-06-26 ENCOUNTER — Other Ambulatory Visit: Payer: Self-pay | Admitting: *Deleted

## 2013-06-26 MED ORDER — LISDEXAMFETAMINE DIMESYLATE 70 MG PO CAPS: 70.0000 mg | ORAL_CAPSULE | Freq: Every morning | ORAL | Status: DC

## 2013-06-26 NOTE — Telephone Encounter (Signed)
Last seen-05/16/2013  Last filled-05/16/2013  UDS-not on file, contract signed   Please advise. SW

## 2013-06-27 NOTE — Telephone Encounter (Signed)
Med filled and patient notified.

## 2013-07-08 ENCOUNTER — Emergency Department (HOSPITAL_COMMUNITY)
Admission: EM | Admit: 2013-07-08 | Discharge: 2013-07-08 | Disposition: A | Discharge status: Home / Self Care | Payer: BC Managed Care – PPO | Attending: Emergency Medicine | Admitting: Emergency Medicine

## 2013-07-08 ENCOUNTER — Encounter (HOSPITAL_COMMUNITY): Payer: Self-pay | Admitting: Emergency Medicine

## 2013-07-08 ENCOUNTER — Emergency Department (HOSPITAL_COMMUNITY): Payer: BC Managed Care – PPO

## 2013-07-08 DIAGNOSIS — Z9889 Other specified postprocedural states: Secondary | ICD-10-CM | POA: Insufficient documentation

## 2013-07-08 DIAGNOSIS — R197 Diarrhea, unspecified: Secondary | ICD-10-CM | POA: Insufficient documentation

## 2013-07-08 DIAGNOSIS — Z8659 Personal history of other mental and behavioral disorders: Secondary | ICD-10-CM | POA: Insufficient documentation

## 2013-07-08 DIAGNOSIS — Z3202 Encounter for pregnancy test, result negative: Secondary | ICD-10-CM | POA: Insufficient documentation

## 2013-07-08 DIAGNOSIS — R110 Nausea: Secondary | ICD-10-CM | POA: Insufficient documentation

## 2013-07-08 DIAGNOSIS — Z8679 Personal history of other diseases of the circulatory system: Secondary | ICD-10-CM | POA: Insufficient documentation

## 2013-07-08 DIAGNOSIS — R109 Unspecified abdominal pain: Secondary | ICD-10-CM | POA: Insufficient documentation

## 2013-07-08 DIAGNOSIS — Z8619 Personal history of other infectious and parasitic diseases: Secondary | ICD-10-CM | POA: Insufficient documentation

## 2013-07-08 DIAGNOSIS — Z79899 Other long term (current) drug therapy: Secondary | ICD-10-CM | POA: Insufficient documentation

## 2013-07-08 DIAGNOSIS — R1031 Right lower quadrant pain: Secondary | ICD-10-CM | POA: Insufficient documentation

## 2013-07-08 LAB — URINALYSIS, ROUTINE W REFLEX MICROSCOPIC
Glucose, UA: NEGATIVE mg/dL
Hgb urine dipstick: NEGATIVE
Ketones, ur: NEGATIVE mg/dL
Leukocytes, UA: NEGATIVE
Nitrite: NEGATIVE
Protein, ur: NEGATIVE mg/dL
Specific Gravity, Urine: 1.011 (ref 1.005–1.030)
pH: 7 (ref 5.0–8.0)

## 2013-07-08 LAB — BASIC METABOLIC PANEL
Calcium: 9.9 mg/dL (ref 8.4–10.5)
Chloride: 101 mEq/L (ref 96–112)
Creatinine, Ser: 0.8 mg/dL (ref 0.50–1.10)
GFR calc Af Amer: >90 mL/min (ref >90)
Glucose, Bld: 95 mg/dL (ref 70–99)
Potassium: 3.7 mEq/L (ref 3.5–5.1)

## 2013-07-08 LAB — CBC WITH DIFFERENTIAL/PLATELET
Basophils Absolute: 0 10*3/uL (ref 0.0–0.1)
Basophils Relative: 0 % (ref 0–1)
HCT: 40.5 % (ref 36.0–46.0)
Hemoglobin: 13.2 g/dL (ref 12.0–15.0)
MCHC: 32.6 g/dL (ref 30.0–36.0)
Monocytes Absolute: 0.5 10*3/uL (ref 0.1–1.0)
Neutro Abs: 4.6 10*3/uL (ref 1.7–7.7)
Neutrophils Relative %: 50 % (ref 43–77)
Platelets: 334 10*3/uL (ref 150–400)
RDW: 13.7 % (ref 11.5–15.5)

## 2013-07-08 LAB — LIPASE, BLOOD: Lipase: 25 U/L (ref 11–59)

## 2013-07-08 LAB — POCT PREGNANCY, URINE: Preg Test, Ur: NEGATIVE

## 2013-07-08 MED ORDER — IOHEXOL 300 MG/ML  SOLN
50.0000 mL | Freq: Once | INTRAMUSCULAR | Status: AC | PRN
  Administered 2013-07-08: 50 mL via ORAL

## 2013-07-08 MED ORDER — IOHEXOL 300 MG/ML  SOLN
100.0000 mL | Freq: Once | INTRAMUSCULAR | Status: AC | PRN
  Administered 2013-07-08: 100 mL via INTRAVENOUS

## 2013-07-08 MED ORDER — SODIUM CHLORIDE 0.9 % IV BOLUS (SEPSIS)
1000.0000 mL | Freq: Once | INTRAVENOUS | Status: AC
  Administered 2013-07-08: 1000 mL via INTRAVENOUS

## 2013-07-08 MED ORDER — KETOROLAC TROMETHAMINE 30 MG/ML IJ SOLN
30.0000 mg | Freq: Once | INTRAMUSCULAR | Status: AC
Start: 2013-07-08 — End: 2013-07-08
  Administered 2013-07-08: 30 mg via INTRAVENOUS
  Filled 2013-07-08: qty 1

## 2013-07-08 MED ORDER — ONDANSETRON HCL 4 MG/2ML IJ SOLN
4.0000 mg | Freq: Once | INTRAMUSCULAR | Status: AC
Start: 2013-07-08 — End: 2013-07-08
  Administered 2013-07-08: 4 mg via INTRAVENOUS
  Filled 2013-07-08: qty 2

## 2013-07-08 MED ORDER — MELOXICAM 7.5 MG PO TABS: 7.5000 mg | ORAL_TABLET | Freq: Every day | ORAL | Status: DC

## 2013-07-08 MED ORDER — ONDANSETRON HCL 4 MG PO TABS: 4.0000 mg | ORAL_TABLET | Freq: Four times a day (QID) | ORAL | Status: DC

## 2013-07-08 NOTE — ED Notes (Signed)
Pt presents with c/o abdominal pain and right sided flank pain. Pt says she has felt "run down" for a couple of days but the abdominal and right flank pain started today. Pt has had nausea today, diarrhea yesterday, but no vomiting. No burning with urination or hematuria.

## 2013-07-08 NOTE — ED Notes (Signed)
Pt verbalizes understanding 

## 2013-07-08 NOTE — ED Provider Notes (Signed)
CSN: [id number]     Arrival date & time 07/08/13  1625 History   First MD Initiated Contact with Patient 07/08/13 1833     Chief Complaint  Patient presents with  . Abdominal Pain  . Flank Pain   (Consider location/radiation/quality/duration/timing/severity/associated sxs/prior Treatment) Patient is a 27 y.o. female presenting with abdominal pain and flank pain. The history is provided by the patient. No language interpreter was used.  Abdominal Pain Pain location:  R flank Associated symptoms: diarrhea   Associated symptoms: no chills, no constipation, no dysuria, no fever, no hematochezia, no hematuria and no vomiting   Diarrhea:    Number of occurrences:  3 Risk factors: not pregnant   Flank Pain The current episode started in the past 7 days. Associated symptoms include abdominal pain. Pertinent negatives include no chills, fever or vomiting.   Pt is a 27 year old female who presents with right flank pain and right lower abdominal pain. She denies fever, chills and vomiting but has had three episode of diarrhea since yesterday . She reports this pain as constant and sharp. She denies dysuria, hematuria or other urinary symptoms. No history of kidney disease or stones. She denies any vaginal discharge, abnormal bleeding or pelvic pain. She denies any recent sick exposure or travel. She reports nausea and has not felt like eating for the last 24 hours.   Past Medical History  Diagnosis Date  . Migraine 2006  . Anxiety   . Hx of varicella   . Status post primary low transverse cesarean section 04/15/2013  . Postpartum care following cesarean delivery (10/6) 04/16/2013   Past Surgical History  Procedure Laterality Date  . Tonsillectomy    . Knee surgery      left x6, Dr. Eulah Pont  . Hip surgery      took bone for knee  . Labial repair  2006  . Cesarean section N/A 04/15/2013    Procedure: CESAREAN SECTION;  Surgeon: Robley Fries, MD;  Location: [facility];  Service: Obstetrics;   Laterality: N/A;   Family History  Problem Relation Age of Onset  . Thyroid disease Mother   . Cancer Mother     melanoma  . Diabetes Maternal Aunt   . Diabetes Maternal Uncle   . Thyroid disease Paternal Grandmother    History  Substance Use Topics  . Smoking status: Never Smoker   . Smokeless tobacco: Never Used  . Alcohol Use: No   OB History   Grav Para Term Preterm Abortions TAB SAB Ect Mult Living   1 1 1       1      Review of Systems  Constitutional: Negative for fever and chills.  Gastrointestinal: Positive for abdominal pain and diarrhea. Negative for vomiting, constipation and hematochezia.  Genitourinary: Positive for flank pain. Negative for dysuria, hematuria, vaginal pain, menstrual problem and pelvic pain.  All other systems reviewed and are negative.    Allergies  Ambien; Percocet; and Vicodin  Home Medications   Current Outpatient Rx  Name  Route  Sig  Dispense  Refill  . lisdexamfetamine (VYVANSE) 70 MG capsule   Oral   Take 1 capsule (70 mg total) by mouth every morning.   30 capsule   0   . loratadine (CLARITIN) 10 MG tablet   Oral   Take 10 mg by mouth daily.         . Multiple Vitamin (MULTIVITAMIN WITH MINERALS) TABS tablet   Oral   Take 1 tablet  by mouth daily.         . meloxicam (MOBIC) 7.5 MG tablet   Oral   Take 1 tablet (7.5 mg total) by mouth daily.   20 tablet   0   . ondansetron (ZOFRAN) 4 MG tablet   Oral   Take 1 tablet (4 mg total) by mouth every 6 (six) hours.   12 tablet   0    BP 101/51  Pulse 54  Temp(Src) 98.4 F (36.9 C) (Oral)  Resp 14  SpO2 100%  LMP 06/16/2013 Physical Exam  Nursing note and vitals reviewed. Constitutional: She is oriented to person, place, and time. She appears well-developed and well-nourished. No distress.  HENT:  Head: Normocephalic and atraumatic.  Mouth/Throat: Oropharynx is clear and moist.  Eyes: Conjunctivae and EOM are normal.  Neck: Normal range of motion. Neck  supple. No JVD present. No tracheal deviation present. No thyromegaly present.  Cardiovascular: Normal rate, regular rhythm and normal heart sounds.   Pulmonary/Chest: Effort normal and breath sounds normal. No respiratory distress. She has no wheezes.  Abdominal: Normal appearance and bowel sounds are normal. There is no hepatosplenomegaly. There is tenderness in the right lower quadrant. There is no rigidity, no rebound, no guarding, no tenderness at McBurney's point and negative Murphy's sign.  Right flank pain, sharp in nature, radiating into right lower quadrant.  Musculoskeletal: Normal range of motion.  Lymphadenopathy:    She has no cervical adenopathy.  Neurological: She is alert and oriented to person, place, and time.  Skin: Skin is warm and dry.  Psychiatric: She has a normal mood and affect. Her behavior is normal. Judgment and thought content normal.    ED Course  Procedures (including critical care time) Labs Review Labs Reviewed  CBC WITH DIFFERENTIAL  BASIC METABOLIC PANEL  URINALYSIS, ROUTINE W REFLEX MICROSCOPIC  LIPASE, BLOOD  POCT PREGNANCY, URINE   Imaging Review No results found.  EKG Interpretation   None       MDM   1. Abdominal pain    CBC, BMP, lipase, Urinalysis all within normal limits. Abd/pelvis CT; large stool burden without evidence of obstruction. Reassuring exam. VS stable, afebrile.Prescriptions for Zofran and meloxicam given. Return precautions given. Pt understands plan and will follow-up with PCP.      Irish Elders, NP 07/15/13 2306

## 2013-07-17 NOTE — ED Provider Notes (Signed)
Medical screening examination/treatment/procedure(s) were performed by non-physician practitioner and as supervising physician I was immediately available for consultation/collaboration.   Celene KrasJon R Ekam Besson, MD 07/17/13 [phone]

## 2013-07-30 ENCOUNTER — Other Ambulatory Visit: Payer: Self-pay | Admitting: *Deleted

## 2013-07-30 MED ORDER — LISDEXAMFETAMINE DIMESYLATE 70 MG PO CAPS: 70.0000 mg | ORAL_CAPSULE | Freq: Every morning | ORAL | Status: DC

## 2013-07-30 NOTE — Telephone Encounter (Signed)
Last seen-05/16/2013   Last filled-06/26/2013  UDS-not on file, contract signed   Please advise. SW

## 2013-09-04 ENCOUNTER — Other Ambulatory Visit: Payer: Self-pay | Admitting: General Practice

## 2013-09-04 ENCOUNTER — Telehealth: Payer: Self-pay | Admitting: *Deleted

## 2013-09-04 MED ORDER — LISDEXAMFETAMINE DIMESYLATE 70 MG PO CAPS: 70.0000 mg | ORAL_CAPSULE | Freq: Every morning | ORAL | Status: DC

## 2013-09-04 NOTE — Telephone Encounter (Signed)
lisdexamfetamine (VYVANSE) 70 MG capsule Last refill: 07/30/13 #30, 0 refills Last OV: 05/16/13 Contract on file

## 2013-09-04 NOTE — Telephone Encounter (Signed)
Ok for #30, no refills 

## 2013-09-04 NOTE — Telephone Encounter (Signed)
Med filled and pt notified.  

## 2013-10-07 ENCOUNTER — Telehealth: Payer: Self-pay | Admitting: Family Medicine

## 2013-10-07 MED ORDER — LISDEXAMFETAMINE DIMESYLATE 70 MG PO CAPS: 70.0000 mg | ORAL_CAPSULE | Freq: Every morning | ORAL | Status: DC

## 2013-10-07 NOTE — Telephone Encounter (Signed)
Last OV 05-16-13 Med filled 09-04-13 #30 with 0   Med filled and pt notified.

## 2013-10-07 NOTE — Telephone Encounter (Signed)
Patient called and requested a refill for lisdexamfetamine (VYVANSE) 70 MG capsule ° °

## 2013-10-25 ENCOUNTER — Ambulatory Visit: Payer: BC Managed Care – PPO | Admitting: Family Medicine

## 2013-10-28 ENCOUNTER — Ambulatory Visit: Payer: BC Managed Care – PPO | Admitting: Podiatry

## 2013-11-05 ENCOUNTER — Telehealth: Payer: Self-pay | Admitting: Family Medicine

## 2013-11-05 NOTE — Telephone Encounter (Signed)
Caller name:Avarey Woo Relation to OZ:HYQMVHQpt:patient Call back number:[phone] Pharmacy:  Reason for call: to request a refill for Vyvanse

## 2013-11-06 MED ORDER — LISDEXAMFETAMINE DIMESYLATE 70 MG PO CAPS: 70.0000 mg | ORAL_CAPSULE | Freq: Every morning | ORAL | Status: DC

## 2013-11-06 NOTE — Telephone Encounter (Signed)
Patient is requesting a refill on Vyvanse Last OV 05/16/13 Last filled 10/07/13 #30 Contract on file No UDS in [employer] system (confirmed w/rep) Okay to refill?

## 2013-11-06 NOTE — Telephone Encounter (Signed)
Med filled and pt notified.  

## 2013-11-06 NOTE — Telephone Encounter (Signed)
Ok for refill but needs UDS

## 2013-11-15 ENCOUNTER — Ambulatory Visit: Payer: BC Managed Care – PPO

## 2013-11-15 ENCOUNTER — Ambulatory Visit: Payer: BC Managed Care – PPO | Admitting: Family Medicine

## 2013-12-04 ENCOUNTER — Telehealth: Payer: Self-pay | Admitting: Family Medicine

## 2013-12-04 MED ORDER — LISDEXAMFETAMINE DIMESYLATE 70 MG PO CAPS: 70.0000 mg | ORAL_CAPSULE | Freq: Every morning | ORAL | Status: DC

## 2013-12-04 NOTE — Telephone Encounter (Signed)
Caller name: Sarah Stokes Relation to pt: self Call back number: [phone] Pharmacy:  Reason for call: Patient called requesting new rx for vyvanse.

## 2013-12-04 NOTE — Telephone Encounter (Signed)
Last OV 05-16-13 Vyvanse filled 11-06-13 #30 with 0  CSC on file, no UDS.

## 2013-12-04 NOTE — Telephone Encounter (Signed)
Ok for #30, needs UDS 

## 2013-12-04 NOTE — Telephone Encounter (Signed)
Med filled and uds needed.

## 2013-12-31 ENCOUNTER — Other Ambulatory Visit: Payer: Self-pay | Admitting: General Practice

## 2013-12-31 ENCOUNTER — Telehealth: Payer: Self-pay | Admitting: Family Medicine

## 2013-12-31 MED ORDER — LISDEXAMFETAMINE DIMESYLATE 70 MG PO CAPS: 70.0000 mg | ORAL_CAPSULE | Freq: Every morning | ORAL | Status: DC

## 2013-12-31 NOTE — Telephone Encounter (Signed)
Last OV 05-16-13, no upcoming appts Last Filled 12-04-13 #30 with 0 CSC on file, no UDS

## 2013-12-31 NOTE — Telephone Encounter (Signed)
Caller name: Hassan RowanConstance Stokes Relation to pt: patient Call back number: [phone] Pharmacy:  Reason for call:  To request a refill for Vyvanse  j

## 2013-12-31 NOTE — Telephone Encounter (Incomplete)
Ok to refill.  Needs UDS and to schedule up

## 2013-12-31 NOTE — Telephone Encounter (Signed)
Med filled, pt notified that it would be available tomorrow morning for pick up

## 2013-12-31 NOTE — Telephone Encounter (Signed)
That's schedule upcoming CPE

## 2014-01-15 ENCOUNTER — Encounter: Payer: Self-pay | Admitting: Family Medicine

## 2014-01-21 ENCOUNTER — Telehealth: Payer: Self-pay | Admitting: Family Medicine

## 2014-01-21 NOTE — Telephone Encounter (Signed)
Called pt and informed that she needed an appt with tabori. Pt was scheduled for 7/24 at her request.

## 2014-01-21 NOTE — Telephone Encounter (Signed)
Caller name: Ladean RayaConstance Relation to ZO:XWRUEAVpt:patient Call back [phone] Pharmacy:  Reason for call: Patient called stating that she does not feel like her vyvanse is working. Patient would like to try an alternative. Please advise

## 2014-01-31 ENCOUNTER — Ambulatory Visit (INDEPENDENT_AMBULATORY_CARE_PROVIDER_SITE_OTHER): Payer: BC Managed Care – PPO | Admitting: Family Medicine

## 2014-01-31 ENCOUNTER — Encounter: Payer: Self-pay | Admitting: Family Medicine

## 2014-01-31 VITALS — BP 110/80 | HR 86 | Temp 98.1°F | Resp 16 | Wt 143.0 lb

## 2014-01-31 DIAGNOSIS — K130 Diseases of lips: Secondary | ICD-10-CM | POA: Insufficient documentation

## 2014-01-31 DIAGNOSIS — F988 Other specified behavioral and emotional disorders with onset usually occurring in childhood and adolescence: Secondary | ICD-10-CM

## 2014-01-31 MED ORDER — METHYLPHENIDATE HCL ER (OSM) 36 MG PO TBCR: 36.0000 mg | EXTENDED_RELEASE_TABLET | Freq: Every day | ORAL | Status: DC

## 2014-01-31 NOTE — Progress Notes (Signed)
Pre visit review using our clinic review tool, if applicable. No additional management support is needed unless otherwise documented below in the visit note. 

## 2014-01-31 NOTE — Progress Notes (Signed)
   Subjective:    Patient ID: Sarah Stokes, female    DOB: 05/18/1986, 28 y.o.   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  Unable to finish thoughts or stay on task.  Doesn't feel Vyvanse is working like it used to.  Has never been on other meds for ADD.  Angular cheilitis- pt reports sores breaking out around corners of mouth.  No relief w/ Abreva or Lysine.    Review of Systems For ROS see HPI     Objective:   Physical Exam  Vitals reviewed. Constitutional: She is oriented to person, place, and time. She appears well-developed and well-nourished. No distress.  HENT:  Mouth/Throat: Oropharynx is clear and moist.  Angular cheilitis bilaterally  Cardiovascular: Normal rate, regular rhythm, normal heart sounds and intact distal pulses.   Pulmonary/Chest: Effort normal and breath sounds normal. No respiratory distress. She has no wheezes. She has no rales.  Neurological: She is alert and oriented to person, place, and time. No cranial nerve deficit. Coordination normal.  Skin: Skin is warm and dry.  Psychiatric: She has a normal mood and affect. Her behavior is normal. Thought content normal.          Assessment & Plan:

## 2014-01-31 NOTE — Patient Instructions (Signed)
Follow up by phone in 1 month to let me know how it's working STOP the Kelly ServicesVyvanse Start the Concerta daily Apply OTC Clotrimazole twice daily until the sores in the corner of the mouth improve Call with any questions or concerns Hang in there!!

## 2014-02-02 NOTE — Assessment & Plan Note (Addendum)
Chronic problem- deteriorated.  No relief w/ Vyvanse.  Will switch to Concerta daily.  Reviewed supportive care and red flags that should prompt return.  Pt expressed understanding and is in agreement w/ plan.

## 2014-02-02 NOTE — Assessment & Plan Note (Signed)
New.  Start topical clotrimazole BID.

## 2014-03-04 ENCOUNTER — Telehealth: Payer: Self-pay | Admitting: Family Medicine

## 2014-03-04 MED ORDER — METHYLPHENIDATE HCL ER (OSM) 54 MG PO TBCR: 54.0000 mg | EXTENDED_RELEASE_TABLET | ORAL | Status: DC

## 2014-03-04 NOTE — Telephone Encounter (Signed)
Caller name: Mykelle  Relation to pt: self  Call back number: [phone]   Reason for call:   pt wanted to inform you she does not feel any different since being on methylphenidate 36 MG PO CR tablet for 30 days.

## 2014-03-04 NOTE — Telephone Encounter (Signed)
Med filled and pt notified.  

## 2014-03-04 NOTE — Telephone Encounter (Signed)
We can increase to Concerta  daily and monitor for symptom improvement.  If no better in 1 month, will need to switch to Adderall

## 2014-04-04 ENCOUNTER — Telehealth: Payer: Self-pay

## 2014-04-04 MED ORDER — AMPHETAMINE-DEXTROAMPHET ER 20 MG PO CP24: 20.0000 mg | ORAL_CAPSULE | Freq: Every day | ORAL | Status: DC

## 2014-04-04 NOTE — Telephone Encounter (Signed)
Ok to switch to Adderall XR .  #30, no refills.  Will need OV in 1 month to assess med change

## 2014-04-04 NOTE — Telephone Encounter (Signed)
Adonai Helzer Self [phone]  Jennavie called to say the Sarah Stokes is not working, and she is having some vision problems with it as well

## 2014-04-04 NOTE — Telephone Encounter (Signed)
Med filled and pt notified.  

## 2014-04-25 ENCOUNTER — Encounter: Payer: BC Managed Care – PPO | Admitting: Family Medicine

## 2014-04-28 ENCOUNTER — Ambulatory Visit: Payer: Self-pay | Admitting: Medical

## 2014-05-12 ENCOUNTER — Encounter: Payer: Self-pay | Admitting: Family Medicine

## 2014-05-12 ENCOUNTER — Telehealth: Payer: Self-pay | Admitting: Family Medicine

## 2014-05-12 MED ORDER — AMPHETAMINE-DEXTROAMPHET ER 20 MG PO CP24: 20.0000 mg | ORAL_CAPSULE | Freq: Every day | ORAL | Status: DC

## 2014-05-12 NOTE — Telephone Encounter (Signed)
Ok for #30 

## 2014-05-12 NOTE — Telephone Encounter (Signed)
Last OV 01-31-14 adderall filled 9-25 #30 with 0

## 2014-05-12 NOTE — Telephone Encounter (Signed)
Med filled.  

## 2014-05-12 NOTE — Telephone Encounter (Signed)
Caller name: Hassan RowanHarris, Vaunda Relation to pt: self  Call back number: [phone]   Reason for call: pt is requesting a refill amphetamine-dextroamphetamine (ADDERALL XR) 20 MG 24 hr capsule

## 2014-05-20 ENCOUNTER — Telehealth: Payer: Self-pay | Admitting: Family Medicine

## 2014-05-20 NOTE — Telephone Encounter (Signed)
Error/gd °

## 2014-05-21 ENCOUNTER — Ambulatory Visit (HOSPITAL_BASED_OUTPATIENT_CLINIC_OR_DEPARTMENT_OTHER)
Admission: RE | Admit: 2014-05-21 | Discharge: 2014-05-21 | Disposition: A | Discharge status: Home / Self Care | Payer: BC Managed Care – PPO | Source: Ambulatory Visit | Attending: Family Medicine | Admitting: Family Medicine

## 2014-05-21 ENCOUNTER — Encounter: Payer: Self-pay | Admitting: Family Medicine

## 2014-05-21 ENCOUNTER — Ambulatory Visit (INDEPENDENT_AMBULATORY_CARE_PROVIDER_SITE_OTHER): Payer: BC Managed Care – PPO | Admitting: Family Medicine

## 2014-05-21 VITALS — BP 110/80 | HR 69 | Temp 98.0°F | Resp 16 | Wt 139.0 lb

## 2014-05-21 DIAGNOSIS — R1011 Right upper quadrant pain: Secondary | ICD-10-CM

## 2014-05-21 MED ORDER — ONDANSETRON HCL 4 MG PO TABS: 4.0000 mg | ORAL_TABLET | Freq: Three times a day (TID) | ORAL | Status: DC | PRN

## 2014-05-21 NOTE — Assessment & Plan Note (Signed)
New.  Pt's pain is consistent w/ biliary colic.  Get labs to assess liver functions.  Get US to r/o cholecystitis.  Zofran prn for nausea.  Reviewed supportive care and red flags that should prompt return.  Pt expressed understanding and is in agreement w/ plan.

## 2014-05-21 NOTE — Progress Notes (Signed)
Pre visit review using our clinic review tool, if applicable. No additional management support is needed unless otherwise documented below in the visit note. 

## 2014-05-21 NOTE — Progress Notes (Signed)
   Subjective:    Patient ID: Sarah Stokes, female    DOB: 11/19/1985, 28 y.o.   MRN: [id number]  HPI RUQ- sxs started last December.  Went to ER and 'was basically told I'm a hypochondriac'.  Told GYN about it and she thought it was gallbladder.  Has had 'attacks' every 6 weeks.  Starting Saturday (after cheeseburger) has had constant 'dull ache' w/ periods of 'stabbing pain'.  + nausea.  No vomiting.  + bloating and distention.  No burning w/ urination.  Worse after eating.  + anorexia.  No radiation to shoulder but does radiate around R flank.  No GERD.     Review of Systems For ROS see HPI     Objective:   Physical Exam  Constitutional: She is oriented to person, place, and time. She appears well-developed and well-nourished. No distress.  Cardiovascular: Normal rate, regular rhythm and normal heart sounds.   Pulmonary/Chest: Effort normal and breath sounds normal. No respiratory distress. She has no wheezes. She has no rales.  Abdominal: Soft. Bowel sounds are normal. She exhibits no distension and no mass. There is tenderness (RUQ). There is no rebound and no guarding.  Neurological: She is alert and oriented to person, place, and time.  Skin: Skin is warm and dry.  Psychiatric: She has a normal mood and affect. Her behavior is normal. Thought content normal.  Vitals reviewed.         Assessment & Plan:

## 2014-05-21 NOTE — Patient Instructions (Signed)
Your Ultrasound appt is scheduled for 6:30 tonight- do not eat or drink anything between now and then [facility] any changes if needed Use the Zofran as needed for nausea Drink plenty of fluids Call with any questions or concerns Hang in there!!

## 2014-05-22 ENCOUNTER — Other Ambulatory Visit: Payer: Self-pay | Admitting: Family Medicine

## 2014-05-22 DIAGNOSIS — R1011 Right upper quadrant pain: Secondary | ICD-10-CM

## 2014-05-22 LAB — CBC WITH DIFFERENTIAL/PLATELET
BASOS PCT: 2.1 % (ref 0.0–3.0)
Basophils Absolute: 0.2 10*3/uL — ABNORMAL HIGH (ref 0.0–0.1)
EOS PCT: 1.8 % (ref 0.0–5.0)
Eosinophils Absolute: 0.2 10*3/uL (ref 0.0–0.7)
HEMATOCRIT: 40.5 % (ref 36.0–46.0)
Hemoglobin: 13.4 g/dL (ref 12.0–15.0)
LYMPHS ABS: 3.4 10*3/uL (ref 0.7–4.0)
Lymphocytes Relative: 39.5 % (ref 12.0–46.0)
MCHC: 33 g/dL (ref 30.0–36.0)
MCV: 85.8 fl (ref 78.0–100.0)
MONOS PCT: 6.8 % (ref 3.0–12.0)
Monocytes Absolute: 0.6 10*3/uL (ref 0.1–1.0)
NEUTROS ABS: 4.3 10*3/uL (ref 1.4–7.7)
Neutrophils Relative %: 49.8 % (ref 43.0–77.0)
PLATELETS: 275 10*3/uL (ref 150.0–400.0)
RBC: 4.73 Mil/uL (ref 3.87–5.11)
RDW: 12.9 % (ref 11.5–15.5)
WBC: 8.7 10*3/uL (ref 4.0–10.5)

## 2014-05-22 LAB — BASIC METABOLIC PANEL
BUN: 8 mg/dL (ref 6–23)
CALCIUM: 9.3 mg/dL (ref 8.4–10.5)
CO2: 25 mEq/L (ref 19–32)
Chloride: 106 mEq/L (ref 96–112)
Creatinine, Ser: 0.9 mg/dL (ref 0.4–1.2)
GFR: 81.02 mL/min (ref >60.00)
GLUCOSE: 92 mg/dL (ref 70–99)
Potassium: 3.9 mEq/L (ref 3.5–5.1)
Sodium: 138 mEq/L (ref 135–145)

## 2014-05-22 LAB — HEPATIC FUNCTION PANEL
ALBUMIN: 3.2 g/dL — AB (ref 3.5–5.2)
ALT: 12 U/L (ref 0–35)
AST: 14 U/L (ref 0–37)
Alkaline Phosphatase: 63 U/L (ref 39–117)
BILIRUBIN TOTAL: 0.5 mg/dL (ref 0.2–1.2)
Bilirubin, Direct: 0 mg/dL (ref 0.0–0.3)
Total Protein: 7.3 g/dL (ref 6.0–8.3)

## 2014-05-22 LAB — H. PYLORI ANTIBODY, IGG: H PYLORI IGG: NEGATIVE

## 2014-06-03 ENCOUNTER — Telehealth: Payer: Self-pay

## 2014-06-03 NOTE — Telephone Encounter (Signed)
Sarah RayaConstance [phone]  The amphetamine-dextroamphetamine (ADDERALL XR) 20 MG 24 hr is working, but maybe dosage increased a little, is there any way she can pick this up at another location or maybe get more than one at a time with different days.

## 2014-06-04 MED ORDER — AMPHETAMINE-DEXTROAMPHET ER 25 MG PO CP24: 25.0000 mg | ORAL_CAPSULE | ORAL | Status: DC

## 2014-06-04 NOTE — Telephone Encounter (Signed)
Med filled will call pt to notify.

## 2014-06-04 NOTE — Telephone Encounter (Signed)
Ok to increase to Adderall XR 25mg  daily.  Unable to pick up at different location due to it being controlled substance.  Can fill 3 months w/ post-dates.  Original med was filled 11/2- pt can pick up Monday for 3 months

## 2014-06-04 NOTE — Telephone Encounter (Signed)
Pt notified and rx placed at the front desk.

## 2014-08-27 ENCOUNTER — Encounter: Payer: BC Managed Care – PPO | Admitting: Family Medicine

## 2014-09-15 HISTORY — PX: KNEE ARTHROSCOPY W/ ALLOGRAFT IMPANT: SHX1862

## 2014-10-10 ENCOUNTER — Telehealth: Payer: Self-pay | Admitting: Family Medicine

## 2014-10-10 MED ORDER — AMPHETAMINE-DEXTROAMPHET ER 25 MG PO CP24: 25.0000 mg | ORAL_CAPSULE | ORAL | Status: DC

## 2014-10-10 NOTE — Telephone Encounter (Signed)
Med filled.  

## 2014-10-10 NOTE — Telephone Encounter (Signed)
Caller name: Oksana Relation to pt: self Call back number: [phone] Pharmacy:  Reason for call:   Requesting adderral refill

## 2014-10-10 NOTE — Telephone Encounter (Signed)
Last OV 05/21/14 adderall last filed 06/04/14 #30 with 0

## 2014-10-10 NOTE — Telephone Encounter (Signed)
Ok for #30 

## 2014-10-10 NOTE — Telephone Encounter (Signed)
Pt notified available at front desk.

## 2014-11-19 ENCOUNTER — Telehealth: Payer: Self-pay | Admitting: Family Medicine

## 2014-11-19 MED ORDER — AMPHETAMINE-DEXTROAMPHET ER 25 MG PO CP24: 25.0000 mg | ORAL_CAPSULE | ORAL | Status: DC

## 2014-11-19 NOTE — Telephone Encounter (Signed)
Med filled, pt notified available at front desk for pick up.  

## 2014-11-19 NOTE — Telephone Encounter (Signed)
Last OV 05-21-14 (RUQ Pain) adderall last filled 10-10-14 #30 with 0  Pt has not had a CPE listed since 2013.

## 2014-11-19 NOTE — Telephone Encounter (Signed)
Ok for #30, please remind pt to schedule CPE

## 2014-11-19 NOTE — Telephone Encounter (Signed)
Caller name: Daziyah Relation to pt: self Call back number: [phone] Pharmacy:  Reason for call:   Requesting adderall rx

## 2015-01-30 LAB — OB RESULTS CONSOLE HIV ANTIBODY (ROUTINE TESTING): HIV: NONREACTIVE

## 2015-01-30 LAB — OB RESULTS CONSOLE RPR: RPR: NONREACTIVE

## 2015-04-06 ENCOUNTER — Encounter: Payer: Self-pay | Admitting: Family Medicine

## 2015-04-07 ENCOUNTER — Telehealth: Payer: Self-pay | Admitting: Family Medicine

## 2015-04-07 NOTE — Telephone Encounter (Signed)
No charge as pt called

## 2015-04-07 NOTE — Telephone Encounter (Signed)
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

## 2015-06-15 ENCOUNTER — Encounter: Payer: Self-pay | Admitting: Internal Medicine

## 2015-06-17 ENCOUNTER — Other Ambulatory Visit: Payer: Self-pay | Admitting: Obstetrics and Gynecology

## 2015-06-17 DIAGNOSIS — R1011 Right upper quadrant pain: Secondary | ICD-10-CM

## 2015-06-19 ENCOUNTER — Other Ambulatory Visit: Payer: BLUE CROSS/BLUE SHIELD

## 2015-06-26 ENCOUNTER — Ambulatory Visit
Admission: RE | Admit: 2015-06-26 | Discharge: 2015-06-26 | Disposition: A | Discharge status: Home / Self Care | Payer: BLUE CROSS/BLUE SHIELD | Source: Ambulatory Visit | Attending: Obstetrics and Gynecology | Admitting: Obstetrics and Gynecology

## 2015-06-26 DIAGNOSIS — R1011 Right upper quadrant pain: Secondary | ICD-10-CM

## 2015-07-09 LAB — OB RESULTS CONSOLE GBS: STREP GROUP B AG: NEGATIVE

## 2015-07-22 ENCOUNTER — Inpatient Hospital Stay (HOSPITAL_COMMUNITY)
Admission: AD | Admit: 2015-07-22 | Discharge: 2015-07-22 | Disposition: A | Discharge status: Home / Self Care | Payer: BLUE CROSS/BLUE SHIELD | Source: Ambulatory Visit | Attending: Obstetrics and Gynecology | Admitting: Obstetrics and Gynecology

## 2015-07-22 ENCOUNTER — Encounter (HOSPITAL_COMMUNITY): Payer: Self-pay | Admitting: *Deleted

## 2015-07-22 DIAGNOSIS — O26893 Other specified pregnancy related conditions, third trimester: Secondary | ICD-10-CM | POA: Diagnosis not present

## 2015-07-22 DIAGNOSIS — Z3A34 34 weeks gestation of pregnancy: Secondary | ICD-10-CM | POA: Diagnosis not present

## 2015-07-22 DIAGNOSIS — M25559 Pain in unspecified hip: Secondary | ICD-10-CM

## 2015-07-22 DIAGNOSIS — Z885 Allergy status to narcotic agent status: Secondary | ICD-10-CM | POA: Diagnosis not present

## 2015-07-22 LAB — URINALYSIS, ROUTINE W REFLEX MICROSCOPIC
BILIRUBIN URINE: NEGATIVE
Glucose, UA: NEGATIVE mg/dL
KETONES UR: NEGATIVE mg/dL
Nitrite: NEGATIVE
PH: 6 (ref 5.0–8.0)
Protein, ur: NEGATIVE mg/dL
Specific Gravity, Urine: <1.005 — ABNORMAL LOW (ref 1.005–1.030)

## 2015-07-22 LAB — URINE MICROSCOPIC-ADD ON

## 2015-07-22 MED ORDER — CYCLOBENZAPRINE HCL 10 MG PO TABS: 10.0000 mg | ORAL_TABLET | Freq: Three times a day (TID) | ORAL | Status: DC

## 2015-07-22 MED ORDER — CYCLOBENZAPRINE HCL 10 MG PO TABS
10.0000 mg | ORAL_TABLET | Freq: Once | ORAL | Status: AC
  Administered 2015-07-22: 10 mg via ORAL
  Filled 2015-07-22: qty 1

## 2015-07-22 NOTE — MAU Provider Note (Signed)
History     CSN: [id number]  Arrival date and time: 07/22/15 4098   First Provider Initiated Contact with Patient 07/22/15 2042      Chief Complaint  Patient presents with  . Hip Pain  . Back Pain  . Vaginal Pain   HPI Comments: Sarah Stokes is a 30 y.o. G2P1001 at [redacted]w[redacted]d who presents toady with hip pain. She denies any contractions, VB or LOF. She states that the fetus has been moving normally. She has had two surgeries on her hip. One surgery to remove bone from the hip for knee repair, and one surgery to repair overgrowth of bone on the hip.   Hip Pain  The incident occurred more than 1 week ago. The injury mechanism was a fall (patient reports falling down two stairs 2 weeks ago. ). The pain is present in the left hip and left thigh (left lower back and butt, and vaginal pain. ). The quality of the pain is described as shooting. The pain is at a severity of 8/10. The pain has been constant since onset. Associated symptoms include numbness ("sometimes numbness in my vagina") and tingling. Pertinent negatives include no inability to bear weight. She has tried acetaminophen, heat, rest and elevation for the symptoms. The treatment provided mild ("termorarily") relief.    Past Medical History  Diagnosis Date  . Migraine 2006  . Anxiety   . Hx of varicella   . Status post primary low transverse cesarean section 04/15/2013  . Postpartum care following cesarean delivery (10/6) 04/16/2013    Past Surgical History  Procedure Laterality Date  . Tonsillectomy    . Knee surgery      left x6, Dr. Eulah Pont  . Hip surgery      took bone for knee  . Labial repair  2006  . Cesarean section N/A 04/15/2013    Procedure: CESAREAN SECTION;  Surgeon: Robley Fries, MD;  Location: [facility];  Service: Obstetrics;  Laterality: N/A;    Family History  Problem Relation Age of Onset  . Thyroid disease Mother   . Cancer Mother     melanoma  . Diabetes Maternal Aunt   . Diabetes Maternal Uncle    . Thyroid disease Paternal Grandmother     Social History  Substance Use Topics  . Smoking status: Never Smoker   . Smokeless tobacco: Never Used  . Alcohol Use: No    Allergies:  Allergies  Allergen Reactions  . Ambien [Zolpidem Tartrate]     Hallucinations  . Percocet [Oxycodone-Acetaminophen] Hives and Nausea And Vomiting  . Vicodin [Hydrocodone-Acetaminophen] Hives and Nausea And Vomiting    Prescriptions prior to admission  Medication Sig Dispense Refill Last Dose  . acetaminophen (TYLENOL) 500 MG tablet Take 500 mg by mouth every 6 (six) hours as needed for moderate pain.   Past Week at Unknown time  . ondansetron (ZOFRAN) 4 MG tablet Take 1 tablet (4 mg total) by mouth every 8 (eight) hours as needed for nausea or vomiting. 30 tablet 0 Past Week at Unknown time  . Prenatal Vit-Fe Fumarate-FA (PRENATAL MULTIVITAMIN) TABS tablet Take 1 tablet by mouth daily at 12 noon.   07/21/2015 at Unknown time  . Probiotic Product (PROBIOTIC PO) Take 1 capsule by mouth daily.   Past Week at Unknown time  . amphetamine-dextroamphetamine (ADDERALL XR) 25 MG 24 hr capsule Take 1 capsule by mouth every morning. (Patient not taking: Reported on 07/22/2015) 30 capsule 0   . methylphenidate 54 MG PO CR  tablet Take 1 tablet (54 mg total) by mouth every morning. (Patient not taking: Reported on 07/22/2015) 30 tablet 0 Not Taking    Review of Systems  Neurological: Positive for tingling and numbness ("sometimes numbness in my vagina").   Physical Exam   Blood pressure 119/61, pulse 70, temperature 97.6 F (36.4 C), temperature source Oral, resp. rate 18, last menstrual period 11/21/2014, SpO2 100 %.  Physical Exam  Nursing note and vitals reviewed. Constitutional: She is oriented to person, place, and time. She appears well-developed and well-nourished. No distress.  HENT:  Head: Normocephalic.  Cardiovascular: Normal rate.   Respiratory: Effort normal.  GI: Soft. There is no tenderness.   Genitourinary:   Cervix: FT/50/-2   Musculoskeletal: She exhibits no edema or tenderness.  Neurological: She is alert and oriented to person, place, and time.  Skin: Skin is warm and dry.  Psychiatric: She has a normal mood and affect.    FHT 125, moderate with 15x15 accels, no decels Toco: no UCs  MAU Course  Procedures  MDM  2059: D/W Dr. Vincente PoliGrewal, will give flexeril. If patient responds then she may be DC home with RX to take at home.  2202: Patient is feeling better.  Assessment and Plan   1. Pregnancy related hip pain in third trimester, antepartum    DC home Comfort measures reviewed  3rd Trimester precautions  PTL precautions  Fetal kick counts RX: flexeril 10mg  TID #21  Return to MAU as needed FU with OB as planned  Follow-up Information    Follow up with Jeani HawkingGREWAL,[name] L, MD.   Specialty:  Obstetrics and Gynecology   Why:  As scheduled   Contact information:   [address] [place] [id number] [phone]         Tawnya CrookHogan, Heather Donovan 07/22/2015, 8:44 PM

## 2015-07-22 NOTE — Discharge Instructions (Signed)

## 2015-07-22 NOTE — MAU Note (Addendum)
Pt states she has been experiencing left sided lower back, hip pain which also goes down in the vaginal area x 2 weeks.  Pt states pain is increasing.  Tylenol, warm showers, and heating pad do not change pain.  Denies vaginal bleeding or ROM.  Good fetal movement.  After speaking with FOB and pt, pt recalls a fall down two stairs about two weeks ago.  Pt has also had prior hip surgery to that same hip.

## 2015-08-09 ENCOUNTER — Encounter (HOSPITAL_COMMUNITY): Payer: Self-pay | Admitting: *Deleted

## 2015-08-09 ENCOUNTER — Inpatient Hospital Stay (HOSPITAL_COMMUNITY)
Admission: AD | Admit: 2015-08-09 | Discharge: 2015-08-09 | Disposition: A | Discharge status: Home / Self Care | Payer: BLUE CROSS/BLUE SHIELD | Source: Ambulatory Visit | Attending: Obstetrics and Gynecology | Admitting: Obstetrics and Gynecology

## 2015-08-09 DIAGNOSIS — Z3493 Encounter for supervision of normal pregnancy, unspecified, third trimester: Secondary | ICD-10-CM | POA: Diagnosis not present

## 2015-08-09 LAB — URINALYSIS, ROUTINE W REFLEX MICROSCOPIC
BILIRUBIN URINE: NEGATIVE
Glucose, UA: NEGATIVE mg/dL
Hgb urine dipstick: NEGATIVE
KETONES UR: NEGATIVE mg/dL
NITRITE: NEGATIVE
PROTEIN: NEGATIVE mg/dL
Specific Gravity, Urine: 1.01 (ref 1.005–1.030)
pH: 7.5 (ref 5.0–8.0)

## 2015-08-09 LAB — URINE MICROSCOPIC-ADD ON

## 2015-08-09 NOTE — MAU Note (Signed)
C/o ucs since yesterday evening @ 2000; pt slept during the night and ucs started again around 1000; had some brownish discharge yesterday also;

## 2015-08-09 NOTE — Discharge Instructions (Signed)
Cesarean Delivery Cesarean delivery is the birth of a baby through a cut (incision) in the abdomen and womb (uterus).  LET YOUR HEALTH CARE PROVIDER KNOW ABOUT:  All medicines you are taking, including vitamins, herbs, eye drops, creams, and over-the-counter medicines.  Previous problems you or members of your family have had with the use of anesthetics.  Any bleeding or blood clotting disorders you have.  Family history of blood clots or bleeding disorders.  Any history of deep vein thrombosis (DVT) or pulmonary embolism (PE).  Previous surgeries you have had.  Medical conditions you have.  Any allergies you have.  Complicationsinvolving the pregnancy. RISKS AND COMPLICATIONS  Generally, this is a safe procedure. However, as with any procedure, complications can occur. Possible complications include:  Bleeding.  Infection.  Blood clots.  Injury to surrounding organs.  Problems with anesthesia.  Injury to the baby. BEFORE THE PROCEDURE   You may be given an antacid medicine to drink. This will prevent acid contents in your stomach from going into your lungs if you vomit during the surgery.  You may be given an antibiotic medicine to prevent infection. PROCEDURE   To prevent infection of your incision:  Hair may be removed from your pubic area if it is near your incision.  The skin of your pubic area and lower abdomen will be cleaned with a germ-killing solution (antiseptic).  A tube (Foley catheter) will be placed in your bladder to drain your urine from your bladder into a bag. This keeps your bladder empty during surgery.  An IV tube will be placed in your vein.  You may be given medicine to numb the lower half of your body (regional anesthetic). If you were in labor, you may have already had an epidural in place which can be used in both labor and cesarean delivery. You may possibly be given medicine to make you sleep (general anesthetic) though this is not as  common.  Your heart rate and your baby's heart rate will be monitored.  An incision will be made in your abdomen that extends to your uterus. There are 2 basic kinds of incisions:  The horizontal (transverse) incision. Horizontal incisions are from side to side and are used for most routine cesarean deliveries.  The vertical incision. The vertical incision is from the top of the abdomen to the bottom and is less commonly used. It is often done for women who have a serious complication (extreme prematurity) or under emergency situations.  The horizontal and vertical incisions may both be used at the same time. However, this is very uncommon.  An incision is then made in your uterus to deliver the baby.  Your baby will be delivered.  Your health care provider may place the baby on your chest. It is important to keep the baby warm. Your health care provider will dry off the baby, place the baby directly on your bare skin, and cover the baby with warm, dry blankets.  Both incisions will be closed with absorbable stitches. AFTER THE PROCEDURE   If you were awake during the surgery, you will see your baby right away. If you were asleep, you will see your baby as soon as you are awake.  You may breastfeed your baby after surgery.  You may be able to get up and walk the same day as the surgery. If you need to stay in bed for a period of time, you will receive help to turn, cough, and take deep breaths after   surgery. This helps prevent lung problems such as pneumonia.  Do not get out of bed alone the first time after surgery. You will need help getting out of bed until you are able to do this by yourself.  You may be able to shower the day after your cesarean delivery. After the bandage (dressing) is taken off the incision site, a nurse will assist you to shower if you would like help.  You may be directed to take actions to help prevent blood clots in your legs. These may  include:  Walking shortly after surgery, with someone assisting you. Moving around after surgery helps to improve blood flow.  Wearing compression stockings or using different types of devices.  Taking medicines to thin your blood (anticoagulants) if you are at high risk for DVT or PE.  Save any blood clots that you pass from your vagina. If you pass a clot while on the toilet, do not flush it. Call for the nurse. Tell the nurse if you think you are bleeding too much or passing too many clots.  You will be given medicine for pain and nausea as needed. Let your health care providers know if you are hurting. You may also be given an antibiotic to prevent an infection.  Your IV tube will be taken out when you are drinking a reasonable amount of fluids. The Foley catheter is taken out when you are up and walking.  If your blood type is Rh negative and your baby's blood type is Rh positive, you will be given a shot of anti-D immune globulin. This shot prevents you from having Rh problems with a future pregnancy. You should get the shot even if you had your tubes tied (tubal ligation).  If you are allowed to take the baby for a walk, place the baby in the bassinet and push it.   This information is not intended to replace advice given to you by your health care provider. Make sure you discuss any questions you have with your health care provider.   Document Released: 06/27/2005 Document Revised: 03/18/2015 Document Reviewed: 02/22/2012 Elsevier Interactive Patient Education 2016 [employer] Inc[employer]  

## 2015-08-13 ENCOUNTER — Ambulatory Visit: Payer: BLUE CROSS/BLUE SHIELD | Admitting: Internal Medicine

## 2015-08-14 NOTE — H&P (Signed)
Miku Udall is a 30 y.o. G 2 P 1 for Repeat LTCS History OB History    Gravida Para Term Preterm AB TAB SAB Ectopic Multiple Living   Past Medical History  Diagnosis Date  . Migraine 2006  . Anxiety   . Hx of varicella   . Status post primary low transverse cesarean section 04/15/2013  . Postpartum care following cesarean delivery (10/6) 04/16/2013   Past Surgical History  Procedure Laterality Date  . Tonsillectomy    . Knee surgery      left x6, Dr. Eulah Pont  . Hip surgery      took bone for knee  . Labial repair  2006  . Cesarean section N/A 04/15/2013    Procedure: CESAREAN SECTION;  Surgeon: Robley Fries, MD;  Location: [facility];  Service: Obstetrics;  Laterality: N/A;   Family History: family history includes Cancer in her mother; Diabetes in her maternal aunt and maternal uncle; Thyroid disease in her mother and paternal grandmother. Social History:  reports that she has never smoked. She has never used smokeless tobacco. She reports that she does not drink alcohol or use illicit drugs.   Prenatal Transfer Tool  Maternal Diabetes: No Genetic Screening: Normal Maternal Ultrasounds/Referrals: Normal Fetal Ultrasounds or other Referrals:  None Maternal Substance Abuse:  No Significant Maternal Medications:  None Significant Maternal Lab Results:  None Other Comments:  None  Review of Systems  All other systems reviewed and are negative.     Last menstrual period 11/21/2014. Maternal Exam:  Abdomen: Fetal presentation: vertex     Physical Exam  Nursing note and vitals reviewed. Constitutional: She appears well-developed and well-nourished.  HENT:  Head: Normocephalic.  Eyes: Pupils are equal, round, and reactive to light.  Neck: Normal range of motion.  Cardiovascular: Normal rate and regular rhythm.     Prenatal labs: ABO, Rh:   Antibody:   Rubella:   RPR: Nonreactive (07/22 0000)  HBsAg:    HIV: Non-reactive (07/22 0000)  GBS:  Negative (12/29 0000)   Assessment/Plan: IUP at term Previous C Section Repeat LTCS  Korina Tretter L 08/14/2015, 8:11 AM

## 2015-08-21 ENCOUNTER — Encounter (HOSPITAL_COMMUNITY): Payer: Self-pay

## 2015-08-21 ENCOUNTER — Encounter (HOSPITAL_COMMUNITY)
Admission: RE | Admit: 2015-08-21 | Discharge: 2015-08-21 | Disposition: A | Discharge status: Home / Self Care | Payer: BLUE CROSS/BLUE SHIELD | Source: Ambulatory Visit | Attending: Obstetrics and Gynecology | Admitting: Obstetrics and Gynecology

## 2015-08-21 LAB — CBC
HCT: 36.4 % (ref 36.0–46.0)
Hemoglobin: 11.9 g/dL — ABNORMAL LOW (ref 12.0–15.0)
MCH: 28.8 pg (ref 26.0–34.0)
MCHC: 32.7 g/dL (ref 30.0–36.0)
MCV: 88.1 fL (ref 78.0–100.0)
PLATELETS: 214 10*3/uL (ref 150–400)
RBC: 4.13 MIL/uL (ref 3.87–5.11)
RDW: 13.6 % (ref 11.5–15.5)
WBC: 10 10*3/uL (ref 4.0–10.5)

## 2015-08-21 LAB — TYPE AND SCREEN
ABO/RH(D): O POS
Antibody Screen: NEGATIVE

## 2015-08-21 LAB — ABO/RH: ABO/RH(D): O POS

## 2015-08-21 NOTE — Patient Instructions (Addendum)
   Your procedure is scheduled on: February  ( Monday)  Enter through the Main Entrance of [facility] at: 1130AM  Pick up the phone at the desk and dial [phone] and inform us of your arrival.  Please call this number if you have any problems the morning of surgery: [phone]  Remember: Do not eat food after midnight: FEB 12 Do not drink clear liquids after:  9AM DAY OF SURGERY   Do not wear jewelry, make-up, or FINGER nail polish No metal in your hair or on your body. Do not wear lotions, powders, perfumes.  You may wear deodorant.  Do not bring valuables to the hospital. Contacts, dentures or bridgework may not be worn into surgery.  Leave suitcase in the car. After Surgery it may be brought to your room. For patients being admitted to the hospital, checkout time is 11:00am the day of discharge.

## 2015-08-23 ENCOUNTER — Inpatient Hospital Stay (HOSPITAL_COMMUNITY)
Admission: AD | Admit: 2015-08-23 | Discharge: 2015-08-24 | Disposition: A | Discharge status: Home / Self Care | Payer: BLUE CROSS/BLUE SHIELD | Source: Ambulatory Visit | Attending: Obstetrics and Gynecology | Admitting: Obstetrics and Gynecology

## 2015-08-24 ENCOUNTER — Inpatient Hospital Stay (HOSPITAL_COMMUNITY)
Admission: RE | Admit: 2015-08-24 | Discharge: 2015-08-26 | DRG: 766 | Disposition: A | Discharge status: Home / Self Care | Payer: BLUE CROSS/BLUE SHIELD | Source: Ambulatory Visit | Attending: Obstetrics and Gynecology | Admitting: Obstetrics and Gynecology

## 2015-08-24 ENCOUNTER — Inpatient Hospital Stay (HOSPITAL_COMMUNITY): Payer: BLUE CROSS/BLUE SHIELD | Admitting: Anesthesiology

## 2015-08-24 ENCOUNTER — Encounter (HOSPITAL_COMMUNITY): Payer: Self-pay | Admitting: Obstetrics

## 2015-08-24 ENCOUNTER — Encounter (HOSPITAL_COMMUNITY): Payer: Self-pay

## 2015-08-24 ENCOUNTER — Encounter (HOSPITAL_COMMUNITY)
Admission: RE | Disposition: A | Discharge status: Home / Self Care | Payer: Self-pay | Source: Ambulatory Visit | Attending: Obstetrics and Gynecology

## 2015-08-24 DIAGNOSIS — Z3A39 39 weeks gestation of pregnancy: Secondary | ICD-10-CM | POA: Diagnosis not present

## 2015-08-24 DIAGNOSIS — Z98891 History of uterine scar from previous surgery: Secondary | ICD-10-CM

## 2015-08-24 DIAGNOSIS — Z833 Family history of diabetes mellitus: Secondary | ICD-10-CM

## 2015-08-24 DIAGNOSIS — O34211 Maternal care for low transverse scar from previous cesarean delivery: Secondary | ICD-10-CM | POA: Diagnosis present

## 2015-08-24 LAB — URINE MICROSCOPIC-ADD ON: RBC / HPF: NONE SEEN RBC/hpf (ref 0–5)

## 2015-08-24 LAB — URINALYSIS, ROUTINE W REFLEX MICROSCOPIC
BILIRUBIN URINE: NEGATIVE
GLUCOSE, UA: NEGATIVE mg/dL
Hgb urine dipstick: NEGATIVE
KETONES UR: NEGATIVE mg/dL
NITRITE: NEGATIVE
PH: 7 (ref 5.0–8.0)
PROTEIN: NEGATIVE mg/dL
Specific Gravity, Urine: 1.01 (ref 1.005–1.030)

## 2015-08-24 LAB — RPR: RPR: NONREACTIVE

## 2015-08-24 SURGERY — Surgical Case
Anesthesia: Spinal

## 2015-08-24 MED ORDER — ONDANSETRON HCL 4 MG/2ML IJ SOLN
INTRAMUSCULAR | Status: AC
  Filled 2015-08-24: qty 2

## 2015-08-24 MED ORDER — CEFAZOLIN SODIUM-DEXTROSE 2-3 GM-% IV SOLR
2.0000 g | INTRAVENOUS | Status: AC
  Administered 2015-08-24: 2 g via INTRAVENOUS

## 2015-08-24 MED ORDER — DIBUCAINE 1 % RE OINT: 1.0000 "application " | TOPICAL_OINTMENT | RECTAL | Status: DC | PRN

## 2015-08-24 MED ORDER — FENTANYL CITRATE (PF) 100 MCG/2ML IJ SOLN
INTRAMUSCULAR | Status: DC | PRN
  Administered 2015-08-24: 10 ug via INTRAVENOUS

## 2015-08-24 MED ORDER — KETOROLAC TROMETHAMINE 30 MG/ML IJ SOLN
30.0000 mg | Freq: Four times a day (QID) | INTRAMUSCULAR | Status: DC | PRN
  Administered 2015-08-24: 30 mg via INTRAVENOUS
  Filled 2015-08-24: qty 1

## 2015-08-24 MED ORDER — TETANUS-DIPHTH-ACELL PERTUSSIS 5-2.5-18.5 LF-MCG/0.5 IM SUSP: 0.5000 mL | Freq: Once | INTRAMUSCULAR | Status: DC

## 2015-08-24 MED ORDER — SIMETHICONE 80 MG PO CHEW: 80.0000 mg | CHEWABLE_TABLET | ORAL | Status: DC | PRN

## 2015-08-24 MED ORDER — EPHEDRINE 5 MG/ML INJ
INTRAVENOUS | Status: AC
  Filled 2015-08-24: qty 10

## 2015-08-24 MED ORDER — LANOLIN HYDROUS EX OINT: 1.0000 "application " | TOPICAL_OINTMENT | CUTANEOUS | Status: DC | PRN

## 2015-08-24 MED ORDER — DIPHENHYDRAMINE HCL 50 MG/ML IJ SOLN: 12.5000 mg | INTRAMUSCULAR | Status: DC | PRN

## 2015-08-24 MED ORDER — SCOPOLAMINE 1 MG/3DAYS TD PT72
MEDICATED_PATCH | TRANSDERMAL | Status: AC
  Administered 2015-08-24: 1.5 mg via TRANSDERMAL
  Filled 2015-08-24: qty 1

## 2015-08-24 MED ORDER — HYDROMORPHONE HCL 1 MG/ML IJ SOLN
1.0000 mg | Freq: Once | INTRAMUSCULAR | Status: AC
  Administered 2015-08-24: 1 mg via INTRAVENOUS
  Filled 2015-08-24: qty 1

## 2015-08-24 MED ORDER — MORPHINE SULFATE (PF) 0.5 MG/ML IJ SOLN
INTRAMUSCULAR | Status: DC | PRN
  Administered 2015-08-24: .2 mg via EPIDURAL

## 2015-08-24 MED ORDER — KETOROLAC TROMETHAMINE 30 MG/ML IJ SOLN
30.0000 mg | Freq: Four times a day (QID) | INTRAMUSCULAR | Status: DC | PRN
  Administered 2015-08-24: 30 mg via INTRAMUSCULAR

## 2015-08-24 MED ORDER — KETOROLAC TROMETHAMINE 30 MG/ML IJ SOLN: 30.0000 mg | Freq: Four times a day (QID) | INTRAMUSCULAR | Status: DC | PRN

## 2015-08-24 MED ORDER — NALBUPHINE HCL 10 MG/ML IJ SOLN: 5.0000 mg | INTRAMUSCULAR | Status: DC | PRN

## 2015-08-24 MED ORDER — IBUPROFEN 600 MG PO TABS
600.0000 mg | ORAL_TABLET | Freq: Four times a day (QID) | ORAL | Status: DC
  Administered 2015-08-25 – 2015-08-26 (×5): 600 mg via ORAL
  Filled 2015-08-24 (×5): qty 1

## 2015-08-24 MED ORDER — OXYTOCIN 10 UNIT/ML IJ SOLN
INTRAMUSCULAR | Status: AC
  Filled 2015-08-24: qty 4

## 2015-08-24 MED ORDER — LACTATED RINGERS IV SOLN
INTRAVENOUS | Status: DC
  Administered 2015-08-24: 13:00:00 via INTRAVENOUS

## 2015-08-24 MED ORDER — ONDANSETRON HCL 4 MG/2ML IJ SOLN
INTRAMUSCULAR | Status: DC | PRN
  Administered 2015-08-24: 4 mg via INTRAVENOUS

## 2015-08-24 MED ORDER — SCOPOLAMINE 1 MG/3DAYS TD PT72
1.0000 | MEDICATED_PATCH | Freq: Once | TRANSDERMAL | Status: DC
  Administered 2015-08-24: 1.5 mg via TRANSDERMAL

## 2015-08-24 MED ORDER — ATROPINE SULFATE 0.4 MG/ML IJ SOLN
INTRAMUSCULAR | Status: DC | PRN
  Administered 2015-08-24: 0.2 mg via INTRAVENOUS

## 2015-08-24 MED ORDER — NALOXONE HCL 2 MG/2ML IJ SOSY
1.0000 ug/kg/h | PREFILLED_SYRINGE | INTRAVENOUS | Status: DC | PRN
  Filled 2015-08-24: qty 2

## 2015-08-24 MED ORDER — PROMETHAZINE HCL 25 MG/ML IJ SOLN: 6.2500 mg | INTRAMUSCULAR | Status: DC | PRN

## 2015-08-24 MED ORDER — FENTANYL CITRATE (PF) 100 MCG/2ML IJ SOLN
INTRAMUSCULAR | Status: AC
  Filled 2015-08-24: qty 2

## 2015-08-24 MED ORDER — NALBUPHINE HCL 10 MG/ML IJ SOLN
5.0000 mg | INTRAMUSCULAR | Status: DC | PRN
  Administered 2015-08-24 – 2015-08-25 (×3): 5 mg via INTRAVENOUS
  Filled 2015-08-24 (×3): qty 1

## 2015-08-24 MED ORDER — SIMETHICONE 80 MG PO CHEW
80.0000 mg | CHEWABLE_TABLET | Freq: Three times a day (TID) | ORAL | Status: DC
  Administered 2015-08-24 – 2015-08-26 (×5): 80 mg via ORAL
  Filled 2015-08-24 (×5): qty 1

## 2015-08-24 MED ORDER — SODIUM CHLORIDE 0.9% FLUSH: 3.0000 mL | INTRAVENOUS | Status: DC | PRN

## 2015-08-24 MED ORDER — NALBUPHINE HCL 10 MG/ML IJ SOLN: 5.0000 mg | Freq: Once | INTRAMUSCULAR | Status: DC | PRN

## 2015-08-24 MED ORDER — LACTATED RINGERS IV SOLN
INTRAVENOUS | Status: DC
  Administered 2015-08-24 (×2): via INTRAVENOUS

## 2015-08-24 MED ORDER — SIMETHICONE 80 MG PO CHEW
80.0000 mg | CHEWABLE_TABLET | ORAL | Status: DC
  Administered 2015-08-24 – 2015-08-25 (×2): 80 mg via ORAL
  Filled 2015-08-24 (×2): qty 1

## 2015-08-24 MED ORDER — LACTATED RINGERS IV SOLN
40.0000 [IU] | INTRAVENOUS | Status: DC | PRN
  Administered 2015-08-24: 40 [IU] via INTRAVENOUS

## 2015-08-24 MED ORDER — MORPHINE SULFATE (PF) 0.5 MG/ML IJ SOLN
INTRAMUSCULAR | Status: AC
  Filled 2015-08-24: qty 10

## 2015-08-24 MED ORDER — OXYTOCIN 10 UNIT/ML IJ SOLN: 2.5000 [IU]/h | INTRAVENOUS | Status: AC

## 2015-08-24 MED ORDER — MEPERIDINE HCL 25 MG/ML IJ SOLN: 6.2500 mg | INTRAMUSCULAR | Status: DC | PRN

## 2015-08-24 MED ORDER — ACETAMINOPHEN 325 MG PO TABS
650.0000 mg | ORAL_TABLET | ORAL | Status: DC | PRN
  Administered 2015-08-24 – 2015-08-25 (×5): 650 mg via ORAL
  Filled 2015-08-24 (×5): qty 2

## 2015-08-24 MED ORDER — PHENYLEPHRINE 8 MG IN D5W 100 ML (0.08MG/ML) PREMIX OPTIME
INJECTION | INTRAVENOUS | Status: DC | PRN
  Administered 2015-08-24: 90 ug/min via INTRAVENOUS

## 2015-08-24 MED ORDER — MENTHOL 3 MG MT LOZG: 1.0000 | LOZENGE | OROMUCOSAL | Status: DC | PRN

## 2015-08-24 MED ORDER — BUPIVACAINE HCL (PF) 0.25 % IJ SOLN
INTRAMUSCULAR | Status: AC
  Filled 2015-08-24: qty 30

## 2015-08-24 MED ORDER — BUPIVACAINE IN DEXTROSE 0.75-8.25 % IT SOLN
INTRATHECAL | Status: DC | PRN
  Administered 2015-08-24: 1.4 mL via INTRATHECAL

## 2015-08-24 MED ORDER — DIPHENHYDRAMINE HCL 25 MG PO CAPS: 25.0000 mg | ORAL_CAPSULE | Freq: Four times a day (QID) | ORAL | Status: DC | PRN

## 2015-08-24 MED ORDER — WITCH HAZEL-GLYCERIN EX PADS: 1.0000 "application " | MEDICATED_PAD | CUTANEOUS | Status: DC | PRN

## 2015-08-24 MED ORDER — ATROPINE SULFATE 0.4 MG/ML IJ SOLN
INTRAMUSCULAR | Status: AC
Start: 2015-08-24 — End: 2015-08-24
  Filled 2015-08-24: qty 1

## 2015-08-24 MED ORDER — KETOROLAC TROMETHAMINE 30 MG/ML IJ SOLN
INTRAMUSCULAR | Status: AC
  Filled 2015-08-24: qty 1

## 2015-08-24 MED ORDER — PHENYLEPHRINE 8 MG IN D5W 100 ML (0.08MG/ML) PREMIX OPTIME
INJECTION | INTRAVENOUS | Status: AC
  Filled 2015-08-24: qty 100

## 2015-08-24 MED ORDER — PRENATAL MULTIVITAMIN CH
1.0000 | ORAL_TABLET | Freq: Every day | ORAL | Status: DC
  Administered 2015-08-25: 1 via ORAL
  Filled 2015-08-24: qty 1

## 2015-08-24 MED ORDER — SCOPOLAMINE 1 MG/3DAYS TD PT72: 1.0000 | MEDICATED_PATCH | Freq: Once | TRANSDERMAL | Status: DC

## 2015-08-24 MED ORDER — EPHEDRINE SULFATE 50 MG/ML IJ SOLN
INTRAMUSCULAR | Status: DC | PRN
  Administered 2015-08-24 (×2): 5 mg via INTRAVENOUS

## 2015-08-24 MED ORDER — CEFAZOLIN SODIUM-DEXTROSE 2-3 GM-% IV SOLR
INTRAVENOUS | Status: AC
  Filled 2015-08-24: qty 50

## 2015-08-24 MED ORDER — DIPHENHYDRAMINE HCL 25 MG PO CAPS
25.0000 mg | ORAL_CAPSULE | ORAL | Status: DC | PRN
  Filled 2015-08-24: qty 1

## 2015-08-24 MED ORDER — LACTATED RINGERS IV SOLN
Freq: Once | INTRAVENOUS | Status: AC
  Administered 2015-08-24: 12:00:00 via INTRAVENOUS

## 2015-08-24 MED ORDER — HYDROMORPHONE HCL 1 MG/ML IJ SOLN: 0.2500 mg | INTRAMUSCULAR | Status: DC | PRN

## 2015-08-24 MED ORDER — NALOXONE HCL 0.4 MG/ML IJ SOLN: 0.4000 mg | INTRAMUSCULAR | Status: DC | PRN

## 2015-08-24 MED ORDER — ONDANSETRON HCL 4 MG/2ML IJ SOLN: 4.0000 mg | Freq: Three times a day (TID) | INTRAMUSCULAR | Status: DC | PRN

## 2015-08-24 MED ORDER — LACTATED RINGERS IV SOLN
INTRAVENOUS | Status: DC
  Administered 2015-08-25: 02:00:00 via INTRAVENOUS

## 2015-08-24 MED ORDER — ZOLPIDEM TARTRATE 5 MG PO TABS: 5.0000 mg | ORAL_TABLET | Freq: Every evening | ORAL | Status: DC | PRN

## 2015-08-24 MED ORDER — SENNOSIDES-DOCUSATE SODIUM 8.6-50 MG PO TABS
2.0000 | ORAL_TABLET | ORAL | Status: DC
  Administered 2015-08-24 – 2015-08-25 (×2): 2 via ORAL
  Filled 2015-08-24 (×2): qty 2

## 2015-08-24 SURGICAL SUPPLY — 37 items
ADH SKN CLS LQ APL DERMABOND (GAUZE/BANDAGES/DRESSINGS) ×1
BARRIER ADHS 3X4 INTERCEED (GAUZE/BANDAGES/DRESSINGS) IMPLANT
BRR ADH 4X3 ABS CNTRL BYND (GAUZE/BANDAGES/DRESSINGS)
CLAMP CORD UMBIL (MISCELLANEOUS) IMPLANT
CLOTH BEACON ORANGE TIMEOUT ST (SAFETY) ×3 IMPLANT
CONTAINER PREFILL 10% NBF 15ML (MISCELLANEOUS) IMPLANT
DERMABOND ADHESIVE PROPEN (GAUZE/BANDAGES/DRESSINGS) ×2
DERMABOND ADVANCED .7 DNX6 (GAUZE/BANDAGES/DRESSINGS) IMPLANT
DRSG OPSITE POSTOP 4X10 (GAUZE/BANDAGES/DRESSINGS) ×3 IMPLANT
DURAPREP 26ML APPLICATOR (WOUND CARE) ×3 IMPLANT
ELECT REM PT RETURN 9FT ADLT (ELECTROSURGICAL) ×3
ELECTRODE REM PT RTRN 9FT ADLT (ELECTROSURGICAL) ×1 IMPLANT
EXTRACTOR VACUUM M CUP 4 TUBE (SUCTIONS) ×1 IMPLANT
EXTRACTOR VACUUM M CUP 4' TUBE (SUCTIONS) ×1
GLOVE BIO SURGEON STRL SZ 6.5 (GLOVE) ×2 IMPLANT
GLOVE BIO SURGEONS STRL SZ 6.5 (GLOVE) ×1
GLOVE BIOGEL PI IND STRL 7.0 (GLOVE) ×1 IMPLANT
GLOVE BIOGEL PI INDICATOR 7.0 (GLOVE) ×2
GOWN STRL REUS W/TWL LRG LVL3 (GOWN DISPOSABLE) ×6 IMPLANT
KIT ABG SYR 3ML LUER SLIP (SYRINGE) IMPLANT
LIQUID BAND (GAUZE/BANDAGES/DRESSINGS) ×2 IMPLANT
NDL HYPO 25X5/8 SAFETYGLIDE (NEEDLE) ×1 IMPLANT
NEEDLE HYPO 22GX1.5 SAFETY (NEEDLE) IMPLANT
NEEDLE HYPO 25X5/8 SAFETYGLIDE (NEEDLE) ×3 IMPLANT
NS IRRIG 1000ML POUR BTL (IV SOLUTION) ×3 IMPLANT
PACK C SECTION WH (CUSTOM PROCEDURE TRAY) ×3 IMPLANT
PAD OB MATERNITY 4.3X12.25 (PERSONAL CARE ITEMS) ×3 IMPLANT
PENCIL SMOKE EVAC W/HOLSTER (ELECTROSURGICAL) ×3 IMPLANT
SUT CHROMIC 0 CTX 36 (SUTURE) ×6 IMPLANT
SUT PLAIN 0 NONE (SUTURE) IMPLANT
SUT PLAIN 2 0 XLH (SUTURE) IMPLANT
SUT VIC AB 0 CT1 27 (SUTURE) ×9
SUT VIC AB 0 CT1 27XBRD ANBCTR (SUTURE) ×3 IMPLANT
SUT VIC AB 4-0 KS 27 (SUTURE) ×2 IMPLANT
SYR CONTROL 10ML LL (SYRINGE) IMPLANT
TOWEL OR 17X24 6PK STRL BLUE (TOWEL DISPOSABLE) ×3 IMPLANT
TRAY FOLEY CATH SILVER 14FR (SET/KITS/TRAYS/PACK) ×3 IMPLANT

## 2015-08-24 NOTE — Lactation Note (Signed)
This note was copied from a baby's chart. Lactation Consultation Note Initial visit at 9 hours of age.  Mom reports needing to use NS with older child for 6-8 weeks of breastfeeding and has already been given NS for feeding with this baby.  Mom reports she wants to know if she can do something different so she doesn't have to use it.  LC advised mom to work on hand expression use hand pump as needed and call for assist with latch as needed.  Mom to practice without NS.  Mom has flat compressible breast with copious flow of colostrum.  Mom does note colostrum in NS with previous feedings.  Baby now STS post bath and sleeping.  Hand pump given to mom with instructions prior to application of NS and encouraged to request DEBP if she continues to need NS.  [facility] LC resources given and discussed.  Encouraged to feed with early cues on demand.  Early newborn behavior discussed. Colostrum container and syringe given to use to preload NS later if needed. Report given to [facility] RN.   Mom to call for assist as needed.    Patient Name: Sarah Stokes [id number] Date: 08/24/2015 Reason for consult: Initial assessment;Difficult latch   Maternal Data Has patient been taught Hand Expression?: Yes Does the patient have breastfeeding experience prior to this delivery?: Yes  Feeding Feeding Type: Breast Fed Length of feed: 15 min  LATCH Score/Interventions                Intervention(s): Breastfeeding basics reviewed;Skin to skin     Lactation Tools Discussed/Used Pump Review: Setup, frequency, and cleaning Initiated by:: JS Date initiated:: 08/24/15   Consult Status Consult Status: Follow-up Date: 08/25/15 Follow-up type: In-patient    Sarah Stokes, Sarah Stokes 08/24/2015, 11:15 PM

## 2015-08-24 NOTE — Addendum Note (Signed)
Addendum  created 08/24/15 1835 by Algis Greenhouse, CRNA   Modules edited: Clinical Notes   Clinical Notes:  File: [id number]

## 2015-08-24 NOTE — Progress Notes (Signed)
H and P on the chart No changes Consent signed 

## 2015-08-24 NOTE — Brief Op Note (Signed)
08/24/2015  2:01 PM  PATIENT:  Sarah Stokes  30 y.o. female  PRE-OPERATIVE DIAGNOSIS:  IUP at 41 w 3 days Previous C Section  POST-OPERATIVE DIAGNOSIS:   Same  PROCEDURE:  Procedure(s) with comments: CESAREAN SECTION (N/A) - REPEAT EDC 08/28/15   SURGEON:  Surgeon(s) and Role:    * Marcelle Overlie, MD - Primary  PHYSICIAN ASSISTANT:   ASSISTANTS: none   ANESTHESIA:   spinal  EBL:  Total I/O In: 2000 [I.V.:2000] Out: 600 [Urine:100; Blood:500]  BLOOD ADMINISTERED:none  DRAINS: Urinary Catheter (Foley)   LOCAL MEDICATIONS USED:  NONE  SPECIMEN:  No Specimen  DISPOSITION OF SPECIMEN:  N/A  COUNTS:  YES  TOURNIQUET:  * No tourniquets in log *  DICTATION: .Other Dictation: Dictation Number [id number]  PLAN OF CARE: Admit to inpatient   PATIENT DISPOSITION:  PACU - hemodynamically stable.   Delay start of Pharmacological VTE agent (>24hrs) due to surgical blood loss or risk of bleeding: not applicable

## 2015-08-24 NOTE — MAU Note (Signed)
Pt here with c/o contractions since 2100, some nausea and diarrhea tonight. Pain and burning with urination and increased frequency. Denies any bleeding or leaking of fluid. Reports positive fetal movement. Is scheduled for C/S on 2/13 at 1300. Was 1 cm in the office on Tuesday.

## 2015-08-24 NOTE — Anesthesia Postprocedure Evaluation (Signed)
Anesthesia Post Note  Patient: Sarah Stokes  Procedure(s) Performed: Procedure(s) (LRB): CESAREAN SECTION (N/A)  Patient location during evaluation: PACU Anesthesia Type: Spinal Level of consciousness: oriented and awake and alert Pain management: pain level controlled Vital Signs Assessment: post-procedure vital signs reviewed and stable Respiratory status: spontaneous breathing, respiratory function stable and patient connected to nasal cannula oxygen Cardiovascular status: blood pressure returned to baseline and stable Postop Assessment: no headache and no backache Anesthetic complications: no    Last Vitals:  Filed Vitals:   08/24/15 1543 08/24/15 1547  BP: 104/61   Pulse: 59 68  Temp:    Resp: 11 14    Last Pain:  Filed Vitals:   08/24/15 1547  PainSc: 0-No pain                 Sarah Stokes DANIEL

## 2015-08-24 NOTE — Anesthesia Preprocedure Evaluation (Addendum)
Anesthesia Evaluation  Patient identified by MRN, date of birth, ID band Patient awake    Reviewed: Allergy & Precautions, H&P , NPO status , Patient's Chart, lab work & pertinent test results  Airway Mallampati: II  TM Distance: >3 FB Neck ROM: Full    Dental no notable dental hx. (+) Teeth Intact, Dental Advisory Given   Pulmonary neg pulmonary ROS,    Pulmonary exam normal        Cardiovascular negative cardio ROS Normal cardiovascular exam     Neuro/Psych  Headaches, negative psych ROS   GI/Hepatic negative GI ROS, Neg liver ROS,   Endo/Other  negative endocrine ROS  Renal/GU negative Renal ROS     Musculoskeletal   Abdominal   Peds  Hematology   Anesthesia Other Findings   Reproductive/Obstetrics negative OB ROS                           Anesthesia Physical Anesthesia Plan  ASA: II  Anesthesia Plan: Spinal   Post-op Pain Management:    Induction:   Airway Management Planned: Simple Face Mask  Additional Equipment:   Intra-op Plan:   Post-operative Plan:   Informed Consent: I have reviewed the patients History and Physical, chart, labs and discussed the procedure including the risks, benefits and alternatives for the proposed anesthesia with the patient or authorized representative who has indicated his/her understanding and acceptance.   Dental advisory given  Plan Discussed with: Anesthesiologist and CRNA  Anesthesia Plan Comments:        Anesthesia Quick Evaluation

## 2015-08-24 NOTE — Transfer of Care (Signed)
Immediate Anesthesia Transfer of Care Note  Patient: Sarah Stokes  Procedure(s) Performed: Procedure(s) with comments: CESAREAN SECTION (N/A) - REPEAT EDC 08/28/15   Patient Location: PACU  Anesthesia Type:Spinal  Level of Consciousness: awake, alert , oriented and patient cooperative  Airway & Oxygen Therapy: Patient Spontanous Breathing  Post-op Assessment: Report given to RN and Post -op Vital signs reviewed and stable  Post vital signs: Reviewed and stable  Last Vitals:  Filed Vitals:   08/24/15 1132  BP: 115/75  Pulse: 70  Temp: 36.8 C  Resp: 20    Complications: No apparent anesthesia complications

## 2015-08-24 NOTE — Progress Notes (Signed)
Report called to Dr. Renaldo Fiddler re: G2/P1, 39 wks, scheduled repeat C/S today at 1300, 1 cm tonight, same as on Tuesday, urine results WNL, contractions q 5-6 min, less often than at home and less intense per patient. Orders to D/C home, return at scheduled time.

## 2015-08-24 NOTE — Op Note (Signed)
Sarah Stokes, Sarah Stokes            ACCOUNT NO.:  [id number]  MEDICAL RECORD NO.:  [id number]  LOCATION:  9104                          FACILITY:  [facility]  PHYSICIAN:  Hosanna Betley L. Brodrick Curran, M.D.DATE OF BIRTH:  Jan 08, 1986  DATE OF PROCEDURE:  08/24/2015 DATE OF DISCHARGE:                              OPERATIVE REPORT   PREOPERATIVE DIAGNOSIS:  Intrauterine pregnancy (IUP) at 39 weeks and 3 days, and previous cesarean section.  POSTOPERATIVE DIAGNOSIS:  Intrauterine pregnancy (IUP) at 39 weeks and 3 days, and previous cesarean section.  PROCEDURE:  Repeat low transverse cesarean section.  SURGEONS:  Tawnia Schirm L. Vincente Poli, M.D.  ANESTHESIA:  Spinal.  ESTIMATED BLOOD LOSS:  Less than 500.  COMPLICATIONS:  None.  DRAINS:  Foley, draining clear urine.  PATHOLOGY:  None.  DESCRIPTION OF PROCEDURE:  The patient was taken to the operating room. Her spinal was placed.  She was prepped and draped.  A Foley catheter was inserted.  A low transverse incision was made, carried down to the fascia.  Fascia scored in the midline, extended laterally.  The rectus muscles were separated in the midline.  The peritoneum was entered bluntly.  The peritoneal incision was stretched.  The lower uterine segment was identified.  The bladder flap was created in standard fashion.  A low transverse incision was made in the uterus.  The uterus was actually entered using a hemostat.  The baby was in cephalic presentation.  It was delivered easily with a vacuum extractor with no pop-off, with 1 pull.  The baby was a female infant.  Apgars 8 at 1 minute and 9 at 5 minutes.  The cord was clamped and cut.  The baby was handed to the awaiting neonatal team.  The placenta was manually removed, noted to be normal, intact with a three-vessel cord.  The uterus was cleared of all clots and debris.  It was exteriorized and closed in 1 layer using 0 chromic in a running locked stitch.  The uterus was returned to the abdomen.   Irrigation was performed.  The peritoneum was closed using 0 Vicryl.  The fascia closed using 0 Vicryl starting each corner meeting in the midline. After irrigation of the subcu layer, the skin was closed with a 4-0 Vicryl on a Keith needle.  All sponge, lap, instrument counts were correct x2.  The patient went to recovery room in stable condition.     [name] L. Vincente Poli, M.D.     Florestine Avers  D:  08/24/2015  T:  08/24/2015  Job:  [id number]

## 2015-08-24 NOTE — Consult Note (Signed)
[facility]  Delivery Note:  C-section       08/24/2015  1:39 PM  I was called to the operating room at the request of the patient's obstetrician (Dr. Vincente Poli) for a repeat c-section.  PRENATAL HX:  This is a 30 y/o G2P1001 at 86 and 3/[redacted] weeks gestation who was admitted for a repeat c-section.  Her pregnancy has been uncomplicated.    INTRAPARTUM HX:   Repeat c-section with AROM at delivery.  Vacuum extraction   DELIVERY:  Infant was vigorous at delivery, requiring no resuscitation other than standard warming, drying and stimulation.  APGARs 8 and 9.  Exam notable for R sided cephalohematoma but was otherwise within normal limits.  After 5 minutes, baby left with nurse to assist parents with skin-to-skin care.   _____________________ Electronically Signed By: Maryan Char, MD Neonatologist

## 2015-08-24 NOTE — Anesthesia Procedure Notes (Signed)
Spinal Patient location during procedure: OR Start time: 08/24/2015 1:17 PM End time: 08/24/2015 1:23 PM Staffing Anesthesiologist: Heather Roberts Performed by: anesthesiologist  Preanesthetic Checklist Completed: patient identified, surgical consent, pre-op evaluation, timeout performed, IV checked, risks and benefits discussed and monitors and equipment checked Spinal Block Patient position: sitting Prep: DuraPrep Patient monitoring: cardiac monitor, continuous pulse ox and blood pressure Approach: midline Location: L2-3 Injection technique: single-shot Needle Needle type: Pencan  Needle gauge: 24 G Needle length: 9 cm Additional Notes Functioning IV was confirmed and monitors were applied. Sterile prep and drape, including hand hygiene and sterile gloves were used. The patient was positioned and the spine was prepped. The skin was anesthetized with lidocaine.  Free flow of clear CSF was obtained prior to injecting local anesthetic into the CSF.  The spinal needle aspirated freely following injection.  The needle was carefully withdrawn.  The patient tolerated the procedure well.

## 2015-08-24 NOTE — Anesthesia Postprocedure Evaluation (Signed)
Anesthesia Post Note  Patient: Sarah Stokes  Procedure(s) Performed: Procedure(s) (LRB): CESAREAN SECTION (N/A)  Patient location during evaluation: Mother Baby Anesthesia Type: Spinal Level of consciousness: awake Pain management: satisfactory to patient Vital Signs Assessment: post-procedure vital signs reviewed and stable Respiratory status: spontaneous breathing Cardiovascular status: stable Anesthetic complications: no Comments: Current pain 5, pain goal 3.  Receiving demerol IV    Last Vitals:  Filed Vitals:   08/24/15 1552 08/24/15 1700  BP: 102/62 106/63  Pulse: 63 65  Temp: 36.4 C 36.4 C  Resp: 16 18    Last Pain:  Filed Vitals:   08/24/15 1742  PainSc: 6                  Flynn Lininger

## 2015-08-25 ENCOUNTER — Encounter (HOSPITAL_COMMUNITY): Payer: Self-pay | Admitting: Obstetrics and Gynecology

## 2015-08-25 LAB — CBC
HEMATOCRIT: 31.3 % — AB (ref 36.0–46.0)
Hemoglobin: 10.6 g/dL — ABNORMAL LOW (ref 12.0–15.0)
MCH: 29.3 pg (ref 26.0–34.0)
MCHC: 33.9 g/dL (ref 30.0–36.0)
MCV: 86.5 fL (ref 78.0–100.0)
Platelets: 168 10*3/uL (ref 150–400)
RBC: 3.62 MIL/uL — ABNORMAL LOW (ref 3.87–5.11)
RDW: 13.7 % (ref 11.5–15.5)
WBC: 13.2 10*3/uL — ABNORMAL HIGH (ref 4.0–10.5)

## 2015-08-25 MED ORDER — HYDROMORPHONE HCL 2 MG PO TABS
2.0000 mg | ORAL_TABLET | ORAL | Status: DC | PRN
  Administered 2015-08-25 – 2015-08-26 (×4): 2 mg via ORAL
  Filled 2015-08-25 (×4): qty 1

## 2015-08-25 MED ORDER — HYDROMORPHONE HCL 2 MG PO TABS: 2.0000 mg | ORAL_TABLET | ORAL | Status: DC | PRN

## 2015-08-25 NOTE — Progress Notes (Signed)
Subjective: Postpartum Day 1: Cesarean Delivery Patient reports tolerating PO and no problems voiding.    Objective: Vital signs in last 24 hours: Temp:  [97.5 F (36.4 C)-98.4 F (36.9 C)] 98.2 F (36.8 C) (02/14 0541) Pulse Rate:  [57-83] 57 (02/14 0541) Resp:  [10-20] 18 (02/14 0541) BP: (93-115)/(44-75) 93/44 mmHg (02/14 0541) SpO2:  [96 %-100 %] 96 % (02/14 0541)  Physical Exam:  General: alert and cooperative Lochia: appropriate Uterine Fundus: firm Incision: healing well, honeycomb dressing removed earlier secondary to skin reaction. No current erythema noted DVT Evaluation: No evidence of DVT seen on physical exam. Negative Homan's sign. No cords or calf tenderness. No significant calf/ankle edema.   Recent Labs  08/25/15 0530  HGB 10.6*  HCT 31.3*    Assessment/Plan: Status post Cesarean section. Doing well postoperatively.  Continue current care  desires circ prior to discharge.  Bo Teicher G 08/25/2015, 8:01 AM

## 2015-08-25 NOTE — Progress Notes (Signed)
CSW acknowledges consult for history of postpartum depression. MOB with numerous visitors in her room. CSW to attempt to follow up on 2/15.

## 2015-08-25 NOTE — Lactation Note (Signed)
This note was copied from a baby's chart. Lactation Consultation Note  Mother states she is using NS and seems to be helping with latch.  Has noted colostrum in NS after feeding. Spoke with mother and suggest now that baby is 24 hours suggest starting to post pump with DEBP tonight. Johnny Bridge RN aware. Suggest she call if she needs further assistance.  Patient Name: Sarah Stokes [id number] Date: 08/25/2015 Reason for consult: Follow-up assessment   Maternal Data    Feeding Feeding Type: Breast Fed Length of feed: 25 min  LATCH Score/Interventions                      Lactation Tools Discussed/Used     Consult Status Consult Status: Follow-up Date: 08/26/15 Follow-up type: In-patient    Dahlia Byes [facility] 08/25/2015, 1:47 PM

## 2015-08-26 MED ORDER — HYDROMORPHONE HCL 2 MG PO TABS: 2.0000 mg | ORAL_TABLET | ORAL | Status: DC | PRN

## 2015-08-26 MED ORDER — IBUPROFEN 600 MG PO TABS: 600.0000 mg | ORAL_TABLET | Freq: Four times a day (QID) | ORAL | Status: DC

## 2015-08-26 NOTE — Lactation Note (Signed)
This note was copied from a baby's chart. Lactation Consultation Note: Experienced BF mom has baby latched to breast when I went into room. Using NS  Baby fed about 1 hour ago but was showing feeding cues so she latched him again. Going off to sleep. Transitional milk noted in NS when he came off the breast. Reports breasts are feeling heavier this morning. Has Medela and Spectra pump at home. Encouraged to continue pumping to promote milk supply. No questions at present. Reviewed OP appointments and BFSG as resources if needed after DC. To call prn  Patient Name: Sarah Stokes [id number] Date: 08/26/2015 Reason for consult: Follow-up assessment   Maternal Data Formula Feeding for Exclusion: No Has patient been taught Hand Expression?: Yes Does the patient have breastfeeding experience prior to this delivery?: Yes  Feeding Feeding Type: Breast Fed Length of feed: 5 min  LATCH Score/Interventions Latch: Grasps breast easily, tongue down, lips flanged, rhythmical sucking.  Audible Swallowing: None Intervention(s): Skin to skin;Hand expression  Type of Nipple: Flat Intervention(s): Double electric pump  Comfort (Breast/Nipple): Soft / non-tender  Problem noted: Mild/Moderate discomfort Interventions (Mild/moderate discomfort): Hand massage;Hand expression;Post-pump  Hold (Positioning): No assistance needed to correctly position infant at breast.  LATCH Score: 7  Lactation Tools Discussed/Used Breast pump type: Double-Electric Breast Pump WIC Program: No   Consult Status Consult Status: Complete    Pamelia Hoit 08/26/2015, 9:36 AM

## 2015-08-26 NOTE — Discharge Summary (Signed)
Obstetric Discharge Summary Reason for Admission: cesarean section Prenatal Procedures: ultrasound Intrapartum Procedures: cesarean: low cervical, transverse Postpartum Procedures: none Complications-Operative and Postpartum: none HEMOGLOBIN  Date Value Ref Range Status  08/25/2015 10.6* 12.0 - 15.0 g/dL Final   HCT  Date Value Ref Range Status  08/25/2015 31.3* 36.0 - 46.0 % Final    Physical Exam:  General: alert and cooperative Lochia: appropriate Uterine Fundus: firm Incision: healing well DVT Evaluation: No evidence of DVT seen on physical exam. Negative Homan's sign. No cords or calf tenderness. No significant calf/ankle edema.  Discharge Diagnoses: Term Pregnancy-delivered  Discharge Information: Date: 08/26/2015 Activity: pelvic rest Diet: routine Medications: PNV, Ibuprofen and dilaudid Condition: stable Instructions: refer to practice specific booklet Discharge to: home   Newborn Data: Live born female  Birth Weight: 7 lb 9.5 oz (3445 g) APGAR: 8, 9  Home with mother.  Sarah Stokes G 08/26/2015, 8:03 AM

## 2015-08-26 NOTE — Progress Notes (Signed)
CLINICAL SOCIAL WORK MATERNAL/CHILD NOTE  Patient Details  Name: Boy Ladonya Jerkins MRN: [id number] Date of Birth: 08/24/2015  Date:  08/26/2015  Clinical Social Worker Initiating Note:  Elissa Hefty, MSW intenr  Date/ Time Initiated:  08/26/15/0955     Child's Name:  Geralynn Ochs   Legal Guardian:  Quillian Quince and Zipporah Plants    Need for Interpreter:  None   Date of Referral:  08/24/15     Reason for Referral:  Behavioral Health Issues, including SI    Referral Source:  [facility]   Address:  [place], [place] [address]  Phone number:  [phone]   Household Members:  Self, Minor Children, Spouse   Natural Supports (not living in the home):  Friends, Immediate Family, Spouse/significant other   Professional Supports: None   Employment: Full-time   Type of Work: Forensic psychologist    Education:  Engineer, maintenance Resources:  Multimedia programmer   Other Resources:      Cultural/Religious Considerations Which May Impact Care:  None Reported   Strengths:  Ability to meet basic needs , Home prepared for child , Pediatrician chosen    Risk Factors/Current Problems:  Mental Health Concerns- Per chart review, MOB presents with a history of anxiety and depression. According to MOB, she suffered from PPD after her two year old son. MOB reported being prescribed Zoloft from her OB and being on medications for several months.    Cognitive State:  Insightful , Linear Thinking , Able to Concentrate , Goal Oriented    Mood/Affect:  Happy , Interested , Bright , Calm , Comfortable , Relaxed    CSW Assessment:  MSW intern presented in patient's room due to a consult being placed by the central nursery because of a history of depression, anxiety, and PPD. FOB and MOB were present in the room and getting ready to be discharged, MOB provided verbal consent for MOB to engage. MOB presented to be in a happy mood as evidence by her caring for the  infant and actively engaging in the assessment. MOB shared the birthing process went well and she was recovering well postpartum. MOB voiced breastfeeding going well and not having any concerns in that regard. Both MOB and FOB expressed having met all of the infant's basic needs and being prepared to go home. MOB reported having a 30 year old son as well. 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 MOB shared she will be taking the full 12 weeks of her FMLA and FOB will be taking a week off work. FOB disclosed they have a great support system from both sides of their family and laughing shared that they probably are waiting for them in their home now.   MSW intern inquired about MOB's mental health during the pregnancy. MOB shared this pregnancy and birthing process was better than her last and denied any mental health concerns. MOB expressed going through PPD after her son was born in 2014 and immediately being prescribed Zoloft by her OB. MOB described symptoms of anxiety, crying, and sadness. MOB denied any thought of harming herself of her son during that time. MOB shared the symptoms started right after he was born and lasted for a few months. According to MOB, the Zoloft helped managed her symptoms and having a great support and communication with FOB and her family helped her get through it. MOB stated she stopped taking her Zoloft once she started to feel better  and has not had any concerns since. MOB did not share the exact date she stopped taking her Zoloft. MSW intern provided education on perinatal mood disorders and the hospital's support group, Feelings After Birth. MSW intern also asked MOB if she had a plan going into postpartum this time around. MOB voiced her OB was different this time but her and FOB were very aware of the symptoms and have good communication with one another. MOB shared feeling optimistic this time because she knew the symptoms to be aware for and has  knowledge of the illness. MOB expressed being okay with restarting medications if necessary and contacting her OB if needs arise. MSW intern went over the importance of self-care and asked MOB what were things she enjoyed to do when she felt anxious or sad that she has found beneficial. MOB shared a good communication with FOB and great support system from her family have helped her take it day by day and cope with her feelings. MOB denied any mental health concerns at the present time and shared she was excited and felt positive going into postpartum this time around.   MOB denied having any further questions or concerns and thanked MSW intern for stopping by and checking in on her.   CSW Plan/Description:   Engineer, mining- MSW intern provided education on perinatal mood disorders and the hospital's support group, Feelings After Birth.  No Further Intervention Required/No Barriers to Discharge    Trevor Iha, Student-SW 08/26/2015, 10:15 AM

## 2015-10-05 ENCOUNTER — Ambulatory Visit (INDEPENDENT_AMBULATORY_CARE_PROVIDER_SITE_OTHER): Payer: BLUE CROSS/BLUE SHIELD | Admitting: Family Medicine

## 2015-10-05 ENCOUNTER — Encounter: Payer: Self-pay | Admitting: Family Medicine

## 2015-10-05 VITALS — BP 106/78 | HR 74 | Temp 98.1°F | Resp 16 | Wt 161.0 lb

## 2015-10-05 DIAGNOSIS — F909 Attention-deficit hyperactivity disorder, unspecified type: Secondary | ICD-10-CM

## 2015-10-05 DIAGNOSIS — F988 Other specified behavioral and emotional disorders with onset usually occurring in childhood and adolescence: Secondary | ICD-10-CM

## 2015-10-05 MED ORDER — AMPHETAMINE-DEXTROAMPHET ER 20 MG PO CP24: 20.0000 mg | ORAL_CAPSULE | ORAL | Status: DC

## 2015-10-05 NOTE — Progress Notes (Signed)
   Subjective:    Patient ID: Hassan Rowanonstance Bowery, female    DOB: 08/22/1985, 30 y.o.   MRN: [id number]  HPI ADHD- pt is 6 weeks post partum.  Plans to go back to work at 12 weeks.  Not breastfeeding or pumping.  Looking to restart Adderall.  Pt was previously taking 25mg  but prior to that was on 20mg .  Pt plans to start meds prior to returning to work b/c she is doing some work while on leave.  Denies palpitations, irregular heart beat.   Review of Systems For ROS see HPI     Objective:   Physical Exam  Constitutional: She is oriented to person, place, and time. She appears well-developed and well-nourished. No distress.  HENT:  Head: Normocephalic and atraumatic.  Eyes: Conjunctivae and EOM are normal. Pupils are equal, round, and reactive to light.  Neck: Normal range of motion. Neck supple. No thyromegaly present.  Cardiovascular: Normal rate, regular rhythm, normal heart sounds and intact distal pulses.   No murmur heard. Pulmonary/Chest: Effort normal and breath sounds normal. No respiratory distress.  Abdominal: Soft. She exhibits no distension. There is no tenderness.  Musculoskeletal: She exhibits no edema.  Lymphadenopathy:    She has no cervical adenopathy.  Neurological: She is alert and oriented to person, place, and time.  Skin: Skin is warm and dry.  Psychiatric: She has a normal mood and affect. Her behavior is normal.  Vitals reviewed.         Assessment & Plan:

## 2015-10-05 NOTE — Progress Notes (Signed)
Pre visit review using our clinic review tool, if applicable. No additional management support is needed unless otherwise documented below in the visit note. 

## 2015-10-05 NOTE — Patient Instructions (Signed)
Follow up by phone or MyChart in 3-6 weeks to let me know how the meds are working Start the 20mg  daily (we can adjust the dose if needed) Keep up the good work!  You look great!!! Call with any questions or concerns CONGRATS!!!!

## 2015-10-06 NOTE — Assessment & Plan Note (Signed)
Ongoing issue.  Pt is 6 weeks post partum and no longer breast feeding.  Will restart her Adderall at 20mg  daily and monitor for symptom improvement.  Pt expressed understanding and is in agreement w/ plan.

## 2015-11-05 ENCOUNTER — Telehealth: Payer: Self-pay | Admitting: Family Medicine

## 2015-11-05 MED ORDER — AMPHETAMINE-DEXTROAMPHET ER 25 MG PO CP24: 25.0000 mg | ORAL_CAPSULE | ORAL | Status: DC

## 2015-11-05 NOTE — Telephone Encounter (Signed)
Med filled and printed for PCP to sign. Will inform pt tomorrow available at front desk for pick up.

## 2015-11-05 NOTE — Telephone Encounter (Signed)
Last OV 10/05/15 adderall last filled 10/05/15 #30 with 0  Pt would like to increase the dose to 25mg 

## 2015-11-05 NOTE — Telephone Encounter (Signed)
Pt needs refill on adderall and would like to do 25mg .

## 2015-11-05 NOTE — Telephone Encounter (Signed)
Ok to increase to 25mg  daily

## 2015-12-09 ENCOUNTER — Inpatient Hospital Stay (HOSPITAL_COMMUNITY)
Admission: EM | Admit: 2015-12-09 | Discharge: 2015-12-13 | DRG: 603 | Disposition: A | Discharge status: Home / Self Care | Payer: BLUE CROSS/BLUE SHIELD | Attending: Internal Medicine | Admitting: Internal Medicine

## 2015-12-09 ENCOUNTER — Emergency Department (HOSPITAL_COMMUNITY): Payer: BLUE CROSS/BLUE SHIELD

## 2015-12-09 ENCOUNTER — Ambulatory Visit: Payer: BLUE CROSS/BLUE SHIELD | Admitting: Family Medicine

## 2015-12-09 ENCOUNTER — Encounter (HOSPITAL_COMMUNITY): Payer: Self-pay | Admitting: *Deleted

## 2015-12-09 DIAGNOSIS — F909 Attention-deficit hyperactivity disorder, unspecified type: Secondary | ICD-10-CM | POA: Diagnosis present

## 2015-12-09 DIAGNOSIS — T368X5A Adverse effect of other systemic antibiotics, initial encounter: Secondary | ICD-10-CM | POA: Diagnosis not present

## 2015-12-09 DIAGNOSIS — M7989 Other specified soft tissue disorders: Secondary | ICD-10-CM | POA: Insufficient documentation

## 2015-12-09 DIAGNOSIS — R21 Rash and other nonspecific skin eruption: Secondary | ICD-10-CM | POA: Insufficient documentation

## 2015-12-09 DIAGNOSIS — L03213 Periorbital cellulitis: Secondary | ICD-10-CM | POA: Diagnosis not present

## 2015-12-09 DIAGNOSIS — R739 Hyperglycemia, unspecified: Secondary | ICD-10-CM | POA: Diagnosis not present

## 2015-12-09 DIAGNOSIS — H578 Other specified disorders of eye and adnexa: Secondary | ICD-10-CM | POA: Diagnosis not present

## 2015-12-09 DIAGNOSIS — L271 Localized skin eruption due to drugs and medicaments taken internally: Secondary | ICD-10-CM | POA: Diagnosis not present

## 2015-12-09 DIAGNOSIS — M542 Cervicalgia: Secondary | ICD-10-CM | POA: Diagnosis present

## 2015-12-09 DIAGNOSIS — H02846 Edema of left eye, unspecified eyelid: Secondary | ICD-10-CM | POA: Diagnosis present

## 2015-12-09 DIAGNOSIS — E876 Hypokalemia: Secondary | ICD-10-CM | POA: Diagnosis not present

## 2015-12-09 LAB — BASIC METABOLIC PANEL
Anion gap: 8 (ref 5–15)
BUN: 6 mg/dL (ref 6–20)
CALCIUM: 9.7 mg/dL (ref 8.9–10.3)
CHLORIDE: 106 mmol/L (ref 101–111)
CO2: 26 mmol/L (ref 22–32)
CREATININE: 0.86 mg/dL (ref 0.44–1.00)
GFR calc non Af Amer: >60 mL/min (ref >60)
GLUCOSE: 119 mg/dL — AB (ref 65–99)
Potassium: 3.3 mmol/L — ABNORMAL LOW (ref 3.5–5.1)
Sodium: 140 mmol/L (ref 135–145)

## 2015-12-09 LAB — CBC WITH DIFFERENTIAL/PLATELET
BASOS PCT: 0 %
Basophils Absolute: 0 10*3/uL (ref 0.0–0.1)
Eosinophils Absolute: 0.2 10*3/uL (ref 0.0–0.7)
Eosinophils Relative: 2 %
HEMATOCRIT: 42.1 % (ref 36.0–46.0)
HEMOGLOBIN: 14.3 g/dL (ref 12.0–15.0)
LYMPHS ABS: 3 10*3/uL (ref 0.7–4.0)
Lymphocytes Relative: 26 %
MCH: 28.5 pg (ref 26.0–34.0)
MCHC: 34 g/dL (ref 30.0–36.0)
MCV: 83.9 fL (ref 78.0–100.0)
MONO ABS: 0.6 10*3/uL (ref 0.1–1.0)
MONOS PCT: 6 %
NEUTROS ABS: 7.5 10*3/uL (ref 1.7–7.7)
Neutrophils Relative %: 66 %
Platelets: 317 10*3/uL (ref 150–400)
RBC: 5.02 MIL/uL (ref 3.87–5.11)
RDW: 12.7 % (ref 11.5–15.5)
WBC: 11.4 10*3/uL — ABNORMAL HIGH (ref 4.0–10.5)

## 2015-12-09 MED ORDER — ONDANSETRON HCL 4 MG/2ML IJ SOLN
4.0000 mg | Freq: Four times a day (QID) | INTRAMUSCULAR | Status: DC | PRN
  Administered 2015-12-10 – 2015-12-11 (×3): 4 mg via INTRAVENOUS
  Filled 2015-12-09 (×3): qty 2

## 2015-12-09 MED ORDER — ENOXAPARIN SODIUM 40 MG/0.4ML ~~LOC~~ SOLN
40.0000 mg | Freq: Every day | SUBCUTANEOUS | Status: DC
  Administered 2015-12-09 – 2015-12-12 (×3): 40 mg via SUBCUTANEOUS
  Filled 2015-12-09 (×5): qty 0.4

## 2015-12-09 MED ORDER — CLINDAMYCIN PHOSPHATE 600 MG/50ML IV SOLN
600.0000 mg | Freq: Once | INTRAVENOUS | Status: AC
  Administered 2015-12-09: 600 mg via INTRAVENOUS
  Filled 2015-12-09: qty 50

## 2015-12-09 MED ORDER — SODIUM CHLORIDE 0.9 % IV SOLN
INTRAVENOUS | Status: DC
  Administered 2015-12-09 – 2015-12-10 (×2): via INTRAVENOUS

## 2015-12-09 MED ORDER — ONDANSETRON HCL 4 MG PO TABS: 4.0000 mg | ORAL_TABLET | Freq: Four times a day (QID) | ORAL | Status: DC | PRN

## 2015-12-09 MED ORDER — ACETAMINOPHEN 325 MG PO TABS
650.0000 mg | ORAL_TABLET | Freq: Four times a day (QID) | ORAL | Status: DC | PRN
  Administered 2015-12-10 – 2015-12-12 (×4): 650 mg via ORAL
  Filled 2015-12-09 (×4): qty 2

## 2015-12-09 MED ORDER — PRENATAL MULTIVITAMIN CH
1.0000 | ORAL_TABLET | Freq: Every day | ORAL | Status: DC
  Administered 2015-12-10 – 2015-12-12 (×3): 1 via ORAL
  Filled 2015-12-09 (×4): qty 1

## 2015-12-09 MED ORDER — HYDROMORPHONE HCL 1 MG/ML IJ SOLN
0.5000 mg | INTRAMUSCULAR | Status: DC | PRN
  Administered 2015-12-10 – 2015-12-11 (×6): 1 mg via INTRAVENOUS
  Filled 2015-12-09 (×6): qty 1

## 2015-12-09 MED ORDER — ACETAMINOPHEN 650 MG RE SUPP: 650.0000 mg | Freq: Four times a day (QID) | RECTAL | Status: DC | PRN

## 2015-12-09 MED ORDER — AMPHETAMINE-DEXTROAMPHET ER 5 MG PO CP24: 25.0000 mg | ORAL_CAPSULE | Freq: Every day | ORAL | Status: DC

## 2015-12-09 MED ORDER — DIPHENHYDRAMINE HCL 25 MG PO CAPS
25.0000 mg | ORAL_CAPSULE | Freq: Every evening | ORAL | Status: DC | PRN
  Administered 2015-12-09 – 2015-12-10 (×2): 25 mg via ORAL
  Filled 2015-12-09 (×2): qty 1

## 2015-12-09 MED ORDER — IOPAMIDOL (ISOVUE-300) INJECTION 61%
75.0000 mL | Freq: Once | INTRAVENOUS | Status: AC | PRN
  Administered 2015-12-09: 75 mL via INTRAVENOUS

## 2015-12-09 NOTE — ED Notes (Signed)
MD at bedside. 

## 2015-12-09 NOTE — ED Notes (Signed)
Pt transported to CT ?

## 2015-12-09 NOTE — ED Notes (Signed)
Pt reports waking up this am with R eye swelling.  Swelling became worse, so she went to [facility] and was told she has peri-orbital cellulitis and was given abx.  Pt has taken 2 doses of the abx PO and abx eye gtts without relief.  Pt report swelling and pain is much more severe.

## 2015-12-09 NOTE — H&P (Signed)
[facility] Admission History and Physical       Sarah Stokes NWG:[id number] DOB: 04/01/1986 DOA: 12/09/2015  Referring physician: EDP PCP: Neena Rhymes, MD  Specialists:   Chief Complaint:  Right Eye Swelling  HPI: Sarah Stokes is a 30 y.o. female who presents to the ED with complaints of Right Eye and Face swelling since the AM.   She went to the [facility] and was evaluated and prescribed Keflex and Cipro drops but her symptoms were worsening.   She was evaluated in the ED and a Maxillofacial CT scan was performed and revealed Cellulitis with a discrete Abscess formation, and she was started on IV Clindamycin.       Review of Systems:  Constitutional: No Weight Loss, No Weight Gain, Night Sweats, Fevers, Chills, Dizziness, Light Headedness, Fatigue, or Generalized Weakness HEENT: No Headaches, +Right Eye and Face Swelling, Difficulty Swallowing,Tooth/Dental Problems,Sore Throat,  No Sneezing, Rhinitis, Ear Ache, Nasal Congestion, or Post Nasal Drip,  Cardio-vascular:  No Chest pain, Orthopnea, PND, Edema in Lower Extremities, Anasarca, Dizziness, Palpitations  Resp: No Dyspnea, No DOE, No Productive Cough, No Non-Productive Cough, No Hemoptysis, No Wheezing.    GI: No Heartburn, Indigestion, Abdominal Pain, Nausea, Vomiting, Diarrhea, Constipation, Hematemesis, Hematochezia, Melena, Change in Bowel Habits,  Loss of Appetite  GU: No Dysuria, No Change in Color of Urine, No Urgency or Urinary Frequency, No Flank pain.  Musculoskeletal: No Joint Pain or Swelling, No Decreased Range of Motion, No Back Pain.  Neurologic: No Syncope, No Seizures, Muscle Weakness, Paresthesia, Vision Disturbance or Loss, No Diplopia, No Vertigo, No Difficulty Walking,  Skin: No Rash or Lesions. Psych: No Change in Mood or Affect, No Depression or Anxiety, No Memory loss, No Confusion, or Hallucinations   Past Medical History  Diagnosis Date  . Migraine 2006  . Anxiety   . Hx  of varicella   . Status post primary low transverse cesarean section 04/15/2013  . Postpartum care following cesarean delivery (10/6) 04/16/2013     Past Surgical History  Procedure Laterality Date  . Tonsillectomy    . Knee surgery      left x6, Dr. Eulah Pont  . Hip surgery      took bone for knee  . Labial repair  2006  . Cesarean section N/A 04/15/2013    Procedure: CESAREAN SECTION;  Surgeon: Robley Fries, MD;  Location: [facility];  Service: Obstetrics;  Laterality: N/A;  . Cesarean section N/A 08/24/2015    Procedure: CESAREAN SECTION;  Surgeon: Marcelle Overlie, MD;  Location: [facility];  Service: Obstetrics;  Laterality: N/A;  REPEAT EDC 08/28/15       Prior to Admission medications   Medication Sig Start Date End Date Taking? Authorizing Provider  amphetamine-dextroamphetamine (ADDERALL XR) 25 MG 24 hr capsule Take 1 capsule by mouth every morning. 11/05/15  Yes Sheliah Hatch, MD  cephALEXin (KEFLEX) 500 MG capsule Take 500 mg by mouth 4 (four) times daily. 12/09/15  Yes Historical Provider, MD  cetirizine (ZYRTEC) 10 MG tablet Take 10 mg by mouth daily as needed for allergies.   Yes Historical Provider, MD  ciprofloxacin (CILOXAN) 0.3 % ophthalmic solution Place 1 drop into the right eye every 2 (two) hours. Administer 1 drop, every 2 hours, while awake, for 2 days. Then 1 drop, every 4 hours, while awake, for the next 5 days.   Yes Historical Provider, MD  LO LOESTRIN FE 1 MG-10 MCG / 10 MCG tablet Take 1 tablet by mouth daily.  as directed 12/07/15  Yes Historical Provider, MD  Prenatal Vit-Fe Fumarate-FA (PRENATAL MULTIVITAMIN) TABS tablet Take 1 tablet by mouth daily at 12 noon.   Yes Historical Provider, MD  ibuprofen (ADVIL,MOTRIN) 600 MG tablet Take 1 tablet (600 mg total) by mouth every 6 (six) hours. Patient not taking: Reported on 12/09/2015 08/26/15   Julio Sicks, NP     Allergies  Allergen Reactions  . Ambien [Zolpidem Tartrate]     Hallucinations  . Percocet  [Oxycodone-Acetaminophen] Hives and Nausea And Vomiting  . Vicodin [Hydrocodone-Acetaminophen] Hives and Nausea And Vomiting  . Adhesive [Tape] Rash    Tape from epidural     Social History:  reports that she has never smoked. She has never used smokeless tobacco. She reports that she does not drink alcohol or use illicit drugs.    Family History  Problem Relation Age of Onset  . Thyroid disease Mother   . Cancer Mother     melanoma  . Diabetes Maternal Aunt   . Diabetes Maternal Uncle   . Thyroid disease Paternal Grandmother        Physical Exam:  GEN:   Pleasant Well Nourished and Well Developed  30 y.o. Caucasian female examined and in no acute distress; cooperative with exam Filed Vitals:   12/09/15 1828 12/09/15 2155  BP: 123/79 102/66  Pulse: 75 73  Temp: 98.3 F (36.8 C)   TempSrc: Oral   Resp: 18 18  Height: 5\' 2"  (1.575 m)   Weight: 65.772 kg (145 lb)   SpO2: 100% 100%   Blood pressure 102/66, pulse 73, temperature 98.3 F (36.8 C), temperature source Oral, resp. rate 18, height 5\' 2"  (1.575 m), weight 65.772 kg (145 lb), last menstrual period 12/02/2015, SpO2 100 %, not currently breastfeeding. PSYCH: She is alert and oriented x4; does not appear anxious does not appear depressed; affect is normal HEENT: Normocephalic and Right Peri-Orbital Mucous membranes pink; PERRLA; EOM intact; Fundi:  Benign;  No scleral icterus, Nares: Patent, Oropharynx: Clear,  Fair Dentition,    Neck:  FROM, No Cervical Lymphadenopathy nor Thyromegaly or Carotid Bruit; No JVD; Breasts:: Not examined CHEST WALL: No tenderness CHEST: Normal respiration, clear to auscultation bilaterally HEART: Regular rate and rhythm; no murmurs rubs or gallops BACK: No kyphosis or scoliosis; No CVA tenderness ABDOMEN: Positive Bowel Sounds, Soft Non-Tender, No Rebound or Guarding; No Masses, No Organomegaly. Rectal Exam: Not done EXTREMITIES: No Cyanosis, Clubbing, or Edema; No Ulcerations. Genitalia:  not examined PULSES: 2+ and symmetric SKIN: Normal hydration no rash or ulceration CNS:  Alert and Oriented x 4, No Focal Deficits Vascular: pulses palpable throughout    Labs on Admission:  Basic Metabolic Panel:  Recent Labs Lab 12/09/15 1936  NA 140  K 3.3*  CL 106  CO2 26  GLUCOSE 119*  BUN 6  CREATININE 0.86  CALCIUM 9.7   Liver Function Tests: No results for input(s): AST, ALT, ALKPHOS, BILITOT, PROT, ALBUMIN in the last 168 hours. No results for input(s): LIPASE, AMYLASE in the last 168 hours. No results for input(s): AMMONIA in the last 168 hours. CBC:  Recent Labs Lab 12/09/15 1936  WBC 11.4*  NEUTROABS 7.5  HGB 14.3  HCT 42.1  MCV 83.9  PLT 317   Cardiac Enzymes: No results for input(s): CKTOTAL, CKMB, CKMBINDEX, TROPONINI in the last 168 hours.  BNP (last 3 results) No results for input(s): BNP in the last 8760 hours.  ProBNP (last 3 results) No results for input(s): PROBNP in the  last 8760 hours.  CBG: No results for input(s): GLUCAP in the last 168 hours.  Radiological Exams on Admission: Ct Maxillofacial W/cm  12/09/2015  CLINICAL DATA:  Right eye swelling. EXAM: CT MAXILLOFACIAL WITH CONTRAST TECHNIQUE: Multidetector CT imaging of the maxillofacial structures was performed with intravenous contrast. Multiplanar CT image reconstructions were also generated. A small metallic BB was placed on the right temple in order to reliably differentiate right from left. CONTRAST:  75mL ISOVUE-300 IOPAMIDOL (ISOVUE-300) INJECTION 61% COMPARISON:  None. FINDINGS: Visualized intracranial structures are unremarkable. There is flow in the major intracerebral vascular structures. There is extensive edema in the right periorbital and right preseptal region. Normal appearance of both globes. Normal appearance of the retrobulbar fat in both orbits. There is asymmetric soft tissues thickening involving the right side of the face and cheek. Symmetric appearance of the  submandibular glands. Small nodular structures in the right parotid gland probably represent lymph nodes. A few prominent right submandibular lymph nodes which are asymmetric. Negative for a facial bone fracture. Mandibular condyles are located. There may be hypoplasia of the right mastoid air cells. Left mastoid air cells are aerated. Paranasal sinuses are clear. Pterygoid plates are intact. Normal alignment of the upper cervical spine without acute bone abnormality. IMPRESSION: Soft tissue swelling involving the right face and right periorbital tissues. Findings could represent preseptal cellulitis. No evidence for a focal fluid or abscess collection. Few prominent lymph nodes in the right submandibular region and probably in the right parotid gland. These are likely reactive in etiology. Electronically Signed   By: Richarda OverlieAdam  Henn M.D.   On: 12/09/2015 21:17      Assessment/Plan:      30 y.o. female with  Principal Problem:    Periorbital cellulitis of right eye   IV Clindamycin   Active Problems:    Hypokalemia   Replace KCl   Check Magnesium   DVT Prophylaxis   Lovenox     Code Status:     FULL CODE      Family Communication:   Husband at Bedside Disposition Plan:   Observation Status        Time spent: 5470 Minutes      Ron ParkerJENKINS,Jaeleen Inzunza C [facility] Pager [phone]   If 7AM -7PM Please Contact the Day Rounding Team MD for [facility]  If 7PM-7AM, Please Contact Night-Floor Coverage  [email or web address] Password [email or web address] 12/09/2015, 10:29 PM     ADDENDUM:   Patient was seen and examined on 12/09/2015

## 2015-12-09 NOTE — ED Provider Notes (Signed)
CSN: [id number]     Arrival date & time 12/09/15  1824 History   First MD Initiated Contact with Patient 12/09/15 1846     Chief Complaint  Patient presents with  . Facial Swelling     (Consider location/radiation/quality/duration/timing/severity/associated sxs/prior Treatment) HPI Comments: Patient here complaining of worsening right orbital swelling 1 day. Seen at urgent care and diagnosed with preseptal cellulitis as well as blepharitis in place on antibiotics and states that his symptoms have been getting worse. Denies any fever or chills. No neck pain or photophobia. No vision loss. Denies any eye drainage. Symptoms have been getting worse and nothing makes them better. No prior history of same  The history is provided by the patient.    Past Medical History  Diagnosis Date  . Migraine 2006  . Anxiety   . Hx of varicella   . Status post primary low transverse cesarean section 04/15/2013  . Postpartum care following cesarean delivery (10/6) 04/16/2013   Past Surgical History  Procedure Laterality Date  . Tonsillectomy    . Knee surgery      left x6, Dr. Eulah Pont  . Hip surgery      took bone for knee  . Labial repair  2006  . Cesarean section N/A 04/15/2013    Procedure: CESAREAN SECTION;  Surgeon: Robley Fries, MD;  Location: [facility];  Service: Obstetrics;  Laterality: N/A;  . Cesarean section N/A 08/24/2015    Procedure: CESAREAN SECTION;  Surgeon: Marcelle Overlie, MD;  Location: [facility];  Service: Obstetrics;  Laterality: N/A;  REPEAT EDC 08/28/15    Family History  Problem Relation Age of Onset  . Thyroid disease Mother   . Cancer Mother     melanoma  . Diabetes Maternal Aunt   . Diabetes Maternal Uncle   . Thyroid disease Paternal Grandmother    Social History  Substance Use Topics  . Smoking status: Never Smoker   . Smokeless tobacco: Never Used  . Alcohol Use: No   OB History    Gravida Para Term Preterm AB TAB SAB Ectopic Multiple Living   0 2       Review of Systems  All other systems reviewed and are negative.     Allergies  Ambien; Percocet; Vicodin; and Adhesive  Home Medications   Prior to Admission medications   Medication Sig Start Date End Date Taking? Authorizing Provider  amphetamine-dextroamphetamine (ADDERALL XR) 25 MG 24 hr capsule Take 1 capsule by mouth every morning. 11/05/15  Yes Sheliah Hatch, MD  cephALEXin (KEFLEX) 500 MG capsule Take 500 mg by mouth 4 (four) times daily. 12/09/15  Yes Historical Provider, MD  cetirizine (ZYRTEC) 10 MG tablet Take 10 mg by mouth daily as needed for allergies.   Yes Historical Provider, MD  ciprofloxacin (CILOXAN) 0.3 % ophthalmic solution Place 1 drop into the right eye every 2 (two) hours. Administer 1 drop, every 2 hours, while awake, for 2 days. Then 1 drop, every 4 hours, while awake, for the next 5 days.   Yes Historical Provider, MD  LO LOESTRIN FE 1 MG-10 MCG / 10 MCG tablet Take 1 tablet by mouth daily. as directed 12/07/15  Yes Historical Provider, MD  Prenatal Vit-Fe Fumarate-FA (PRENATAL MULTIVITAMIN) TABS tablet Take 1 tablet by mouth daily at 12 noon.   Yes Historical Provider, MD  ibuprofen (ADVIL,MOTRIN) 600 MG tablet Take 1 tablet (600 mg total) by mouth every 6 (six) hours. Patient  not taking: Reported on 12/09/2015 08/26/15   Julio Sicksarol Curtis, NP   BP 123/79 mmHg  Pulse 75  Temp(Src) 98.3 F (36.8 C) (Oral)  Resp 18  Ht 5\' 2"  (1.575 m)  Wt 65.772 kg  BMI 26.51 kg/m2  SpO2 100%  LMP 12/02/2015 Physical Exam  Constitutional: She is oriented to person, place, and time. She appears well-developed and well-nourished.  Non-toxic appearance. No distress.  HENT:  Head: Normocephalic and atraumatic.  Eyes: Conjunctivae, EOM and lids are normal. Pupils are equal, round, and reactive to light.    Neck: Normal range of motion. Neck supple. No tracheal deviation present. No thyroid mass present.  Cardiovascular: Normal rate, regular rhythm and normal heart  sounds.  Exam reveals no gallop.   No murmur heard. Pulmonary/Chest: Effort normal and breath sounds normal. No stridor. No respiratory distress. She has no decreased breath sounds. She has no wheezes. She has no rhonchi. She has no rales.  Abdominal: Soft. Normal appearance and bowel sounds are normal. She exhibits no distension. There is no tenderness. There is no rebound and no CVA tenderness.  Musculoskeletal: Normal range of motion. She exhibits no edema or tenderness.  Neurological: She is alert and oriented to person, place, and time. She has normal strength. No cranial nerve deficit or sensory deficit. GCS eye subscore is 4. GCS verbal subscore is 5. GCS motor subscore is 6.  Skin: Skin is warm and dry. No abrasion and no rash noted.  Psychiatric: She has a normal mood and affect. Her speech is normal and behavior is normal.  Nursing note and vitals reviewed.   ED Course  Procedures (including critical care time) Labs Review Labs Reviewed  CBC WITH DIFFERENTIAL/PLATELET  BASIC METABOLIC PANEL    Imaging Review No results found. I have personally reviewed and evaluated these images and lab results as part of my medical decision-making.   EKG Interpretation None      MDM   Final diagnoses:  None    Patient given dose of IV and a biotics here and will continues to progress. Will be admitted to observation    Lorre NickAnthony Sheron Tallman, MD 12/09/15 2232

## 2015-12-10 DIAGNOSIS — B9689 Other specified bacterial agents as the cause of diseases classified elsewhere: Secondary | ICD-10-CM | POA: Diagnosis not present

## 2015-12-10 DIAGNOSIS — M7989 Other specified soft tissue disorders: Secondary | ICD-10-CM | POA: Diagnosis not present

## 2015-12-10 DIAGNOSIS — R509 Fever, unspecified: Secondary | ICD-10-CM | POA: Diagnosis not present

## 2015-12-10 DIAGNOSIS — F909 Attention-deficit hyperactivity disorder, unspecified type: Secondary | ICD-10-CM | POA: Diagnosis present

## 2015-12-10 DIAGNOSIS — R21 Rash and other nonspecific skin eruption: Secondary | ICD-10-CM | POA: Diagnosis not present

## 2015-12-10 DIAGNOSIS — E876 Hypokalemia: Secondary | ICD-10-CM | POA: Diagnosis not present

## 2015-12-10 DIAGNOSIS — T368X5A Adverse effect of other systemic antibiotics, initial encounter: Secondary | ICD-10-CM | POA: Diagnosis not present

## 2015-12-10 DIAGNOSIS — L271 Localized skin eruption due to drugs and medicaments taken internally: Secondary | ICD-10-CM | POA: Diagnosis not present

## 2015-12-10 DIAGNOSIS — M542 Cervicalgia: Secondary | ICD-10-CM | POA: Diagnosis present

## 2015-12-10 DIAGNOSIS — H578 Other specified disorders of eye and adnexa: Secondary | ICD-10-CM | POA: Diagnosis present

## 2015-12-10 DIAGNOSIS — L03213 Periorbital cellulitis: Secondary | ICD-10-CM | POA: Diagnosis present

## 2015-12-10 DIAGNOSIS — R739 Hyperglycemia, unspecified: Secondary | ICD-10-CM | POA: Diagnosis not present

## 2015-12-10 LAB — CBC
HCT: 37 % (ref 36.0–46.0)
HEMOGLOBIN: 12.1 g/dL (ref 12.0–15.0)
MCH: 28.2 pg (ref 26.0–34.0)
MCHC: 32.7 g/dL (ref 30.0–36.0)
MCV: 86.2 fL (ref 78.0–100.0)
PLATELETS: 283 10*3/uL (ref 150–400)
RBC: 4.29 MIL/uL (ref 3.87–5.11)
RDW: 13 % (ref 11.5–15.5)
WBC: 9.5 10*3/uL (ref 4.0–10.5)

## 2015-12-10 LAB — BASIC METABOLIC PANEL
Anion gap: 7 (ref 5–15)
BUN: 7 mg/dL (ref 6–20)
CALCIUM: 8.6 mg/dL — AB (ref 8.9–10.3)
CHLORIDE: 108 mmol/L (ref 101–111)
CO2: 22 mmol/L (ref 22–32)
CREATININE: 0.84 mg/dL (ref 0.44–1.00)
GFR calc Af Amer: >60 mL/min (ref >60)
Glucose, Bld: 113 mg/dL — ABNORMAL HIGH (ref 65–99)
POTASSIUM: 3.2 mmol/L — AB (ref 3.5–5.1)
Sodium: 137 mmol/L (ref 135–145)

## 2015-12-10 MED ORDER — DIPHENHYDRAMINE HCL 25 MG PO CAPS
25.0000 mg | ORAL_CAPSULE | Freq: Four times a day (QID) | ORAL | Status: DC | PRN
Start: 2015-12-10 — End: 2015-12-13
  Administered 2015-12-11 – 2015-12-12 (×2): 25 mg via ORAL
  Filled 2015-12-10 (×2): qty 1

## 2015-12-10 MED ORDER — AMPHETAMINE-DEXTROAMPHET ER 5 MG PO CP24
25.0000 mg | ORAL_CAPSULE | Freq: Every day | ORAL | Status: DC
  Administered 2015-12-10 – 2015-12-13 (×4): 25 mg via ORAL
  Filled 2015-12-10 (×4): qty 1

## 2015-12-10 MED ORDER — DOCUSATE SODIUM 100 MG PO CAPS
100.0000 mg | ORAL_CAPSULE | Freq: Two times a day (BID) | ORAL | Status: DC
  Administered 2015-12-11 – 2015-12-13 (×5): 100 mg via ORAL
  Filled 2015-12-10 (×6): qty 1

## 2015-12-10 MED ORDER — POTASSIUM CHLORIDE CRYS ER 20 MEQ PO TBCR
40.0000 meq | EXTENDED_RELEASE_TABLET | Freq: Once | ORAL | Status: AC
  Administered 2015-12-10: 40 meq via ORAL
  Filled 2015-12-10: qty 2

## 2015-12-10 MED ORDER — AMPHETAMINE-DEXTROAMPHET ER 5 MG PO CP24: 5.0000 mg | ORAL_CAPSULE | Freq: Every day | ORAL | Status: DC

## 2015-12-10 MED ORDER — CLINDAMYCIN PHOSPHATE 600 MG/50ML IV SOLN
600.0000 mg | Freq: Three times a day (TID) | INTRAVENOUS | Status: DC
  Administered 2015-12-10 – 2015-12-13 (×10): 600 mg via INTRAVENOUS
  Filled 2015-12-10 (×11): qty 50

## 2015-12-10 NOTE — Progress Notes (Signed)
Patient ID: Sarah Stokes, female   DOB: 05/12/1986, 30 y.o.   MRN: [id number]  PROGRESS NOTE    Sarah Stokes  NWG:[id number] DOB: 01/22/1986 DOA: 12/09/2015  PCP: Neena RhymesKatherine Tabori, MD   Brief Narrative:  30 y.o. Female with no significant past medical history. [facility] the right eye. She has been seen in the clinic where she was prescribed Keflex and ciprofloxacin eyedrops but her symptoms continued to get worse. Patient was hemodynamically stable in ED. Maxillofacial CT revealed right periorbital cellulitis without evidence of fluid or abscess collection. She was started on clindamycin    Assessment & Plan:   Principal Problem:   Periorbital cellulitis of right eye - Unclear etiology. Patient does not recall any trauma or bug bite. - It seems that it slowly improving she reports no vision loss or double vision and is able to open up her right eye minimally but per her father at the bedside is slightly better since yesterday -  I tried getting in touch with ophthalmology, Dr. Alben SpittleWeaver, awaiting call back  - I think she does not need any surgical intervention at this point that we can just continue clindamycin   Active Problems:   Hypokalemia - Probably due to acute infection - Supplemented - Check BMP tomorrow morning    DVT prophylaxis: Lovenox subcutaneous Code Status: full code  Family Communication: Father at the bedside Disposition Plan: Home once her cellulitis improves, I anticipated by 12/12/2015   Consultants:   Dr. Baker PieriniWeaver Chris - left message on their office VM [phone])  Procedures:   None   Antimicrobials:   Clindamycin 12/09/2015 -->   Subjective: Says she feels right eye is little better in term os swelling.   Objective: Filed Vitals:   12/09/15 1828 12/09/15 2155 12/09/15 2316 12/10/15 0420  BP: 123/79 102/66 127/70 119/66  Pulse: 75 73 74 74  Temp: 98.3 F (36.8 C)  98.5 F (36.9 C) 98.6 F  (37 C)  TempSrc: Oral  Oral Oral  Resp: 18 18 16 15   Height: 5\' 2"  (1.575 m)     Weight: 65.772 kg (145 lb)     SpO2: 100% 100% 100% 98%    Intake/Output Summary (Last 24 hours) at 12/10/15 0857 Last data filed at 12/10/15 0421  Gross per 24 hour  Intake    120 ml  Output      0 ml  Net    120 ml   Filed Weights   12/09/15 1828  Weight: 65.772 kg (145 lb)    Examination:  General exam: Appears calm and comfortable; periorbital swelling and redness right eye, able to open it slightly and reports no vision loss or double vision  Respiratory system: Clear to auscultation. Respiratory effort normal. Cardiovascular system: S1 & S2 heard, RRR.  Gastrointestinal system: Abdomen is nondistended, soft and nontender. No organomegaly or masses felt. Normal bowel sounds heard. Central nervous system: Alert and oriented. No focal neurological deficits. Extremities: Symmetric 5 x 5 power. Skin: No rashes, lesions or ulcers Psychiatry: Judgement and insight appear normal. Mood & affect appropriate.   Data Reviewed: I have personally reviewed following labs and imaging studies  CBC:  Recent Labs Lab 12/09/15 1936 12/10/15 0536  WBC 11.4* 9.5  NEUTROABS 7.5  --   HGB 14.3 12.1  HCT 42.1 37.0  MCV 83.9 86.2  PLT 317 283   Basic Metabolic Panel:  Recent Labs Lab 12/09/15 1936 12/10/15 0536  NA 140 137  K 3.3* 3.2*  CL 106 108  CO2 26 22  GLUCOSE 119* 113*  BUN 6 7  CREATININE 0.86 0.84  CALCIUM 9.7 8.6*   GFR: Estimated Creatinine Clearance: 87.2 mL/min (by C-G formula based on Cr of 0.84). Liver Function Tests: No results for input(s): AST, ALT, ALKPHOS, BILITOT, PROT, ALBUMIN in the last 168 hours. No results for input(s): LIPASE, AMYLASE in the last 168 hours. No results for input(s): AMMONIA in the last 168 hours. Coagulation Profile: No results for input(s): INR, PROTIME in the last 168 hours. Cardiac Enzymes: No results for input(s): CKTOTAL, CKMB, CKMBINDEX,  TROPONINI in the last 168 hours. BNP (last 3 results) No results for input(s): PROBNP in the last 8760 hours. HbA1C: No results for input(s): HGBA1C in the last 72 hours. CBG: No results for input(s): GLUCAP in the last 168 hours. Lipid Profile: No results for input(s): CHOL, HDL, LDLCALC, TRIG, CHOLHDL, LDLDIRECT in the last 72 hours. Thyroid Function Tests: No results for input(s): TSH, T4TOTAL, FREET4, T3FREE, THYROIDAB in the last 72 hours. Anemia Panel: No results for input(s): VITAMINB12, FOLATE, FERRITIN, TIBC, IRON, RETICCTPCT in the last 72 hours. Urine analysis:    Component Value Date/Time   COLORURINE YELLOW 08/24/2015 0005   APPEARANCEUR CLEAR 08/24/2015 0005   LABSPEC 1.010 08/24/2015 0005   PHURINE 7.0 08/24/2015 0005   GLUCOSEU NEGATIVE 08/24/2015 0005   HGBUR NEGATIVE 08/24/2015 0005   HGBUR large 06/08/2009 1600   BILIRUBINUR NEGATIVE 08/24/2015 0005   KETONESUR NEGATIVE 08/24/2015 0005   PROTEINUR NEGATIVE 08/24/2015 0005   UROBILINOGEN 0.2 07/08/2013 1730   NITRITE NEGATIVE 08/24/2015 0005   LEUKOCYTESUR TRACE* 08/24/2015 0005   Sepsis Labs: (procalcitonin:4,lacticidven:4)   )No results found for this or any previous visit (from the past 240 hour(s)).    Radiology Studies: Ct Maxillofacial W/cm 12/09/2015  Soft tissue swelling involving the right face and right periorbital tissues. Findings could represent preseptal cellulitis. No evidence for a focal fluid or abscess collection. Few prominent lymph nodes in the right submandibular region and probably in the right parotid gland. These are likely reactive in etiology.    Scheduled Meds: . amphetamine-dextroamphetamine  25 mg Oral Daily  . clindamycin (CLEOCIN) IV  600 mg Intravenous Q8H  . enoxaparin (LOVENOX) injection  40 mg Subcutaneous QHS  . prenatal multivitamin  1 tablet Oral Q1200   Continuous Infusions: . sodium chloride 100 mL/hr at 12/09/15 2348      Time spent: 25 minutes    Greater than 50% of the time spent on counseling and coordinating the care.   Manson Passey, MD [facility] Pager [phone]  If 7PM-7AM, please contact night-coverage [email or web address] Password [email or web address] 12/10/2015, 8:57 AM

## 2015-12-10 NOTE — Progress Notes (Signed)
Patient reports new area of swelling to left arm. Patient states area is new since arrival to floor, not painful but itchy. Area marked. MD updated.

## 2015-12-11 DIAGNOSIS — E876 Hypokalemia: Secondary | ICD-10-CM

## 2015-12-11 LAB — CBC
HEMATOCRIT: 37.3 % (ref 36.0–46.0)
HEMOGLOBIN: 12 g/dL (ref 12.0–15.0)
MCH: 28.2 pg (ref 26.0–34.0)
MCHC: 32.2 g/dL (ref 30.0–36.0)
MCV: 87.8 fL (ref 78.0–100.0)
Platelets: 270 10*3/uL (ref 150–400)
RBC: 4.25 MIL/uL (ref 3.87–5.11)
RDW: 13 % (ref 11.5–15.5)
WBC: 7.7 10*3/uL (ref 4.0–10.5)

## 2015-12-11 LAB — BASIC METABOLIC PANEL
Anion gap: 6 (ref 5–15)
BUN: 7 mg/dL (ref 6–20)
CHLORIDE: 108 mmol/L (ref 101–111)
CO2: 24 mmol/L (ref 22–32)
CREATININE: 0.9 mg/dL (ref 0.44–1.00)
Calcium: 8.9 mg/dL (ref 8.9–10.3)
GFR calc non Af Amer: >60 mL/min (ref >60)
Glucose, Bld: 105 mg/dL — ABNORMAL HIGH (ref 65–99)
POTASSIUM: 4.1 mmol/L (ref 3.5–5.1)
SODIUM: 138 mmol/L (ref 135–145)

## 2015-12-11 NOTE — Progress Notes (Signed)
Patient ID: Sarah Stokes, female   DOB: [birth year], 30 y.o.   MRN: [id number]  PROGRESS NOTE    Sarah Stokes  [id number]:[id number] DOB: 1985/11/11 DOA: 12/09/2015  PCP: Neena Rhymes, MD   Brief Narrative:  30 y.o. Female with no significant past medical history. She presented to [facility] with swelling of the right eye. She has been seen in the clinic where she was prescribed Keflex and ciprofloxacin eyedrops but her symptoms continued to get worse. Patient was hemodynamically stable in ED. Maxillofacial CT revealed right periorbital cellulitis without evidence of fluid or abscess collection. She was started on clindamycin    Assessment & Plan:   Principal Problem: Periorbital cellulitis of right eye - Unclear etiology. Patient does not recall any trauma or bug bite. - appears to be slowly improving -esp per friend at bedside -does not need I&D -continue IV clinda -home with Ophthalm FU  Hypokalemia - Supplemented  Area of induration and inflammation L forearm -near prior IV -warm compresses  DVT prophylaxis: Lovenox subcutaneous Code Status: full code  Family Communication: friend atbedside Disposition Plan: Home tomorrow if better   Consultants:   Dr. Baker Pierini -dr.Devine left message with his office VM [phone])  Procedures:   None   Antimicrobials:   Clindamycin 12/09/2015 -->   Subjective: Swelling and redness improving  Objective: Filed Vitals:   12/10/15 1747 12/10/15 2002 12/10/15 2342 12/11/15 0611  BP: 103/51 113/58 107/52 100/53  Pulse:  69 62 52  Temp:  98.3 F (36.8 C) 98.2 F (36.8 C) 98.2 F (36.8 C)  TempSrc:  Oral Oral Oral  Resp:  Height:      Weight:      SpO2:  99% 99% 98%    Intake/Output Summary (Last 24 hours) at 12/11/15 1042 Last data filed at 12/10/15 1430  Gross per 24 hour  Intake 1001.67 ml  Output      0 ml  Net 1001.67 ml   Filed Weights   12/09/15 1828  Weight: 65.772 kg (145 lb)     Examination:  General exam: Appears calm and comfortable; mild periorbital swelling and redness around right eye, able to open it slightly, vision and extraocular movements intact  Respiratory system: Clear to auscultation. Respiratory effort normal. Cardiovascular system: S1 & S2 heard, RRR.  Gastrointestinal system: Abdomen is nondistended, soft and nontender. No organomegaly or masses felt. Normal bowel sounds heard. Central nervous system: Alert and oriented. No focal neurological deficits. Extremities: Symmetric 5 x 5 power. Skin: No rashes, lesions or ulcers Psychiatry: Judgement and insight appear normal. Mood & affect appropriate.   Data Reviewed: I have personally reviewed following labs and imaging studies  CBC:  Recent Labs Lab 12/09/15 1936 12/10/15 0536 12/11/15 0512  WBC 11.4* 9.5 7.7  NEUTROABS 7.5  --   --   HGB 14.3 12.1 12.0  HCT 42.1 37.0 37.3  MCV 83.9 86.2 87.8  PLT 317 283 270   Basic Metabolic Panel:  Recent Labs Lab 12/09/15 1936 12/10/15 0536 12/11/15 0512  NA 140 137 138  K 3.3* 3.2* 4.1  CL 106 108 108  CO2 GLUCOSE 119* 113* 105*  BUN CREATININE 0.86 0.84 0.90  CALCIUM 9.7 8.6* 8.9   GFR: Estimated Creatinine Clearance: 81.4 mL/min (by C-G formula based on Cr of 0.9). Liver Function Tests: No results for input(s): AST, ALT, ALKPHOS, BILITOT, PROT, ALBUMIN in the last 168 hours. No results for input(s):  LIPASE, AMYLASE in the last 168 hours. No results for input(s): AMMONIA in the last 168 hours. Coagulation Profile: No results for input(s): INR, PROTIME in the last 168 hours. Cardiac Enzymes: No results for input(s): CKTOTAL, CKMB, CKMBINDEX, TROPONINI in the last 168 hours. BNP (last 3 results) No results for input(s): PROBNP in the last 8760 hours. HbA1C: No results for input(s): HGBA1C in the last 72 hours. CBG: No results for input(s): GLUCAP in the last 168 hours. Lipid Profile: No results for input(s):  CHOL, HDL, LDLCALC, TRIG, CHOLHDL, LDLDIRECT in the last 72 hours. Thyroid Function Tests: No results for input(s): TSH, T4TOTAL, FREET4, T3FREE, THYROIDAB in the last 72 hours. Anemia Panel: No results for input(s): VITAMINB12, FOLATE, FERRITIN, TIBC, IRON, RETICCTPCT in the last 72 hours. Urine analysis:    Component Value Date/Time   COLORURINE YELLOW 08/24/2015 0005   APPEARANCEUR CLEAR 08/24/2015 0005   LABSPEC 1.010 08/24/2015 0005   PHURINE 7.0 08/24/2015 0005   GLUCOSEU NEGATIVE 08/24/2015 0005   HGBUR NEGATIVE 08/24/2015 0005   HGBUR large 06/08/2009 1600   BILIRUBINUR NEGATIVE 08/24/2015 0005   KETONESUR NEGATIVE 08/24/2015 0005   PROTEINUR NEGATIVE 08/24/2015 0005   UROBILINOGEN 0.2 07/08/2013 1730   NITRITE NEGATIVE 08/24/2015 0005   LEUKOCYTESUR TRACE* 08/24/2015 0005   Sepsis Labs: @LABRCNTIP (procalcitonin:4,lacticidven:4)   )No results found for this or any previous visit (from the past 240 hour(s)).    Radiology Studies: Ct Maxillofacial W/cm 12/09/2015  Soft tissue swelling involving the right face and right periorbital tissues. Findings could represent preseptal cellulitis. No evidence for a focal fluid or abscess collection. Few prominent lymph nodes in the right submandibular region and probably in the right parotid gland. These are likely reactive in etiology.    Scheduled Meds: . amphetamine-dextroamphetamine  25 mg Oral Daily  . clindamycin (CLEOCIN) IV  600 mg Intravenous Q8H  . docusate sodium  100 mg Oral BID  . enoxaparin (LOVENOX) injection  40 mg Subcutaneous QHS  . prenatal multivitamin  1 tablet Oral Q1200   Continuous Infusions: . sodium chloride 10 mL/hr at 12/10/15 1259     LOS: 1 day  Time spent: 35 minutes  Greater than 50% of the time spent on counseling and coordinating the care.   Zannie CoveJOSEPH,Luisfernando Brightwell, MD [facility] Pager [phone]  If 7PM-7AM, please contact night-coverage [email or web address] Password [email or web address] 12/11/2015, 10:42  AM

## 2015-12-12 ENCOUNTER — Inpatient Hospital Stay (HOSPITAL_COMMUNITY): Payer: BLUE CROSS/BLUE SHIELD

## 2015-12-12 DIAGNOSIS — R21 Rash and other nonspecific skin eruption: Secondary | ICD-10-CM | POA: Insufficient documentation

## 2015-12-12 DIAGNOSIS — L03213 Periorbital cellulitis: Principal | ICD-10-CM

## 2015-12-12 DIAGNOSIS — R509 Fever, unspecified: Secondary | ICD-10-CM

## 2015-12-12 DIAGNOSIS — M542 Cervicalgia: Secondary | ICD-10-CM | POA: Insufficient documentation

## 2015-12-12 DIAGNOSIS — M7989 Other specified soft tissue disorders: Secondary | ICD-10-CM | POA: Insufficient documentation

## 2015-12-12 DIAGNOSIS — B9689 Other specified bacterial agents as the cause of diseases classified elsewhere: Secondary | ICD-10-CM

## 2015-12-12 LAB — CBC
HEMATOCRIT: 36.9 % (ref 36.0–46.0)
HEMOGLOBIN: 11.7 g/dL — AB (ref 12.0–15.0)
MCH: 27.1 pg (ref 26.0–34.0)
MCHC: 31.7 g/dL (ref 30.0–36.0)
MCV: 85.4 fL (ref 78.0–100.0)
Platelets: 270 10*3/uL (ref 150–400)
RBC: 4.32 MIL/uL (ref 3.87–5.11)
RDW: 12.8 % (ref 11.5–15.5)
WBC: 8.4 10*3/uL (ref 4.0–10.5)

## 2015-12-12 LAB — BASIC METABOLIC PANEL
Anion gap: 7 (ref 5–15)
BUN: 8 mg/dL (ref 6–20)
CALCIUM: 8.8 mg/dL — AB (ref 8.9–10.3)
CHLORIDE: 105 mmol/L (ref 101–111)
CO2: 26 mmol/L (ref 22–32)
CREATININE: 0.96 mg/dL (ref 0.44–1.00)
GFR calc non Af Amer: >60 mL/min (ref >60)
GLUCOSE: 100 mg/dL — AB (ref 65–99)
Potassium: 3.5 mmol/L (ref 3.5–5.1)
Sodium: 138 mmol/L (ref 135–145)

## 2015-12-12 LAB — ECHOCARDIOGRAM COMPLETE
HEIGHTINCHES: 62 in
WEIGHTICAEL: 2320 [oz_av]

## 2015-12-12 LAB — HEPATIC FUNCTION PANEL
ALBUMIN: 3.4 g/dL — AB (ref 3.5–5.0)
ALK PHOS: 50 U/L (ref 38–126)
ALT: 16 U/L (ref 14–54)
AST: 17 U/L (ref 15–41)
BILIRUBIN TOTAL: 0.3 mg/dL (ref 0.3–1.2)
Total Protein: 6.3 g/dL — ABNORMAL LOW (ref 6.5–8.1)

## 2015-12-12 NOTE — Consult Note (Signed)
Date of Admission:  12/09/2015  Date of Consult:  12/12/2015  Reason for Consult: peri-orbital cellulitis, rash on fingers, back of arm and pain in neck Referring Physician: Dr. Broadus John   HPI: Sarah Stokes is an 30 y.o. female who has had multiple surgeries on left leg after fracture and recent aspiration of baker's cyst and corticosteroid injection on that side who also had baby by CX section. She had been feeling well until this past Wednesday when she awoke with swollen eyelid, facial swelling on the right side. She was seen in the [facility] and given rx for keflex and cipro eye drops but symptoms worsened (not clear to me how much of either medication she took before she ultimately came to ED), came to ED where found to have worsening edema redness of eyelid and face and CT MF done which did not show deep infection. She was treated with IV clindamycin an has had very nice improvement in her eyelide edema and erythema and tenderness. In the interval she has developed an evanescent rash on the back of her left arm and two lesions on her fingers one of which was swollen and difficult to bend. She also developed neck pain and tenderness which has since improve. Dr. Allyson Sabal called me for consult this am and I also spoke tot he patient's uncle who is an ophthalmologist by phone and with him, Dad, Mom in person with the patient later that day.  Low grade temp to 99 along the way but had felt healthy up until onset of swelling of eyelid.  She had nausea without vomiting on clindamycin but no other systemic ssx.   Past Medical History  Diagnosis Date  . Migraine 2006  . Anxiety   . Hx of varicella   . Status post primary low transverse cesarean section 04/15/2013  . Postpartum care following cesarean delivery (10/6) 04/16/2013    Past Surgical History  Procedure Laterality Date  . Tonsillectomy    . Knee surgery      left x6, Dr. [name]  . Hip surgery      took bone for knee    . Labial repair  2006  . Cesarean section N/A 04/15/2013    Procedure: CESAREAN SECTION;  Surgeon: [name], MD;  Location: [facility];  Service: Obstetrics;  Laterality: N/A;  . Cesarean section N/A 08/24/2015    Procedure: CESAREAN SECTION;  Surgeon: [name], MD;  Location: [facility];  Service: Obstetrics;  Laterality: N/A;  REPEAT EDC 08/28/15     Social History:  reports that she has never smoked. She has never used smokeless tobacco. She reports that she does not drink alcohol or use illicit drugs.   Family History  Problem Relation Age of Onset  . Thyroid disease Mother   . Cancer Mother     melanoma  . Diabetes Maternal Aunt   . Diabetes Maternal Uncle   . Thyroid disease Paternal Grandmother     Allergies  Allergen Reactions  . Ambien [Zolpidem Tartrate]     Hallucinations  . Percocet [Oxycodone-Acetaminophen] Hives and Nausea And Vomiting  . Vicodin [Hydrocodone-Acetaminophen] Hives and Nausea And Vomiting  . Adhesive [Tape] Rash    Tape from epidural      Medications: I have reviewed patients current medications as documented in Epic Anti-infectives    Start     Dose/Rate Route Frequency Ordered Stop   12/10/15 0400  clindamycin (CLEOCIN) IVPB 600 mg  600 mg 100 mL/hr over 30 Minutes Intravenous Every 8 hours 12/10/15 0205     12/09/15 1930  clindamycin (CLEOCIN) IVPB 600 mg     600 mg 100 mL/hr over 30 Minutes Intravenous  Once 12/09/15 1918 12/09/15 2010         ROS:  as in HPI otherwise remainder of 12 point Review of Systems is negative   Blood pressure 104/64, pulse 58, temperature 98 F (36.7 C), temperature source Oral, resp. rate 16, height _0  (1.575 m), weight 145 lb (65.772 kg), last menstrual period 12/02/2015, SpO2 100 %, not currently breastfeeding. General: Alert and awake, oriented x3, not in any acute distress. HEENT: anicteric sclera,  EOMI, oropharynx clear and without exudate Cervical LA Right eyelid still with some  edema see pictures Cardiovascular: egular rate, normal r,  no murmur rubs or gallops Pulmonary: clear to auscultation bilaterally, no wheezing, rales or rhonchi Gastrointestinal: soft nontender, nondistended, normal bowel sounds, Musculoskeletal: no  clubbing or edema noted bilaterally  Neuro: nonfocal, strength and sensation intact  Skin and soft tissue:"  Face     12/12/15:       Arm       Fingers         Results for orders placed or performed during the hospital encounter of 12/09/15 (from the past 48 hour(s))  CBC     Status: None   Collection Time: 12/11/15  5:12 AM  Result Value Ref Range   WBC 7.7 4.0 - 10.5 K/uL   RBC 4.25 3.87 - 5.11 MIL/uL   Hemoglobin 12.0 12.0 - 15.0 g/dL   HCT 37.3 36.0 - 46.0 %   MCV 87.8 78.0 - 100.0 fL   MCH 28.2 26.0 - 34.0 pg   MCHC 32.2 30.0 - 36.0 g/dL   RDW 13.0 11.5 - 15.5 %   Platelets 270 150 - 400 K/uL  Basic metabolic panel     Status: Abnormal   Collection Time: 12/11/15  5:12 AM  Result Value Ref Range   Sodium 138 135 - 145 mmol/L   Potassium 4.1 3.5 - 5.1 mmol/L    Comment: RESULT REPEATED AND VERIFIED DELTA CHECK NOTED NO VISIBLE HEMOLYSIS    Chloride 108 101 - 111 mmol/L   CO2 24 22 - 32 mmol/L   Glucose, Bld 105 (H) 65 - 99 mg/dL   BUN 7 6 - 20 mg/dL   Creatinine, Ser 0.90 0.44 - 1.00 mg/dL   Calcium 8.9 8.9 - 10.3 mg/dL   GFR calc non Af Amer >60 >60 mL/min   GFR calc Af Amer >60 >60 mL/min    Comment: (NOTE) The eGFR has been calculated using the CKD EPI equation. This calculation has not been validated in all clinical situations. eGFR's persistently <60 mL/min signify possible Chronic Kidney Disease.    Anion gap 6 5 - 15  CBC     Status: Abnormal   Collection Time: 12/12/15  5:23 AM  Result Value Ref Range   WBC 8.4 4.0 - 10.5 K/uL   RBC 4.32 3.87 - 5.11 MIL/uL   Hemoglobin 11.7 (L) 12.0 - 15.0 g/dL   HCT 36.9 36.0 - 46.0 %   MCV 85.4 78.0 - 100.0 fL   MCH 27.1 26.0 - 34.0 pg   MCHC  31.7 30.0 - 36.0 g/dL   RDW 12.8 11.5 - 15.5 %   Platelets 270 150 - 400 K/uL  Basic metabolic panel     Status: Abnormal   Collection Time: 12/12/15  5:23 AM  Result Value Ref Range   Sodium 138 135 - 145 mmol/L   Potassium 3.5 3.5 - 5.1 mmol/L   Chloride 105 101 - 111 mmol/L   CO2 26 22 - 32 mmol/L   Glucose, Bld 100 (H) 65 - 99 mg/dL   BUN 8 6 - 20 mg/dL   Creatinine, Ser 0.96 0.44 - 1.00 mg/dL   Calcium 8.8 (L) 8.9 - 10.3 mg/dL   GFR calc non Af Amer >60 >60 mL/min   GFR calc Af Amer >60 >60 mL/min    Comment: (NOTE) The eGFR has been calculated using the CKD EPI equation. This calculation has not been validated in all clinical situations. eGFR's persistently <60 mL/min signify possible Chronic Kidney Disease.    Anion gap 7 5 - 15  Hepatic function panel     Status: Abnormal   Collection Time: 12/12/15  5:34 AM  Result Value Ref Range   Total Protein 6.3 (L) 6.5 - 8.1 g/dL   Albumin 3.4 (L) 3.5 - 5.0 g/dL   AST 17 15 - 41 U/L   ALT 16 14 - 54 U/L   Alkaline Phosphatase 50 38 - 126 U/L   Total Bilirubin 0.3 0.3 - 1.2 mg/dL   Bilirubin, Direct <0.1 (L) 0.1 - 0.5 mg/dL   Indirect Bilirubin NOT CALCULATED 0.3 - 0.9 mg/dL   _0 (sdes,specrequest,cult,reptstatus)   )No results found for this or any previous visit (from the past 720 hour(s)).   Impression/Recommendation  Principal Problem:   Periorbital cellulitis of right eye Active Problems:   Hypokalemia   Psalms Olarte is a 30 y.o. female with  Orbital edema and facial edema concerning for orbital cellulitis but also with odd rash, skin lesions and evanescent neck pain  #1 Orbital edema erythema: would continue to treat for orbital cellulitis. Alternatively this could have been an allergic reaction which is calming down  Would continue clindamycin for now  #2  Rash on arm and on two digits. Her relative is concerned for possibility of Infectioous endocarditis and [place]ay lesions. Stigmata of  endocarditis.   I dont think IE is especially likely. These types of stigmata typically appear with subacute endocarditis and this pt does NOT fit that clinically  However I will get at TTE  I told family I would more conclusively be able to say this is not IE  It could be a strange rash to the clinda   IT could be a part of the original presentation and reflect more of an allergic or auto-immune process  For now would continue the clindamycin  #3 Neck pain: has improved. Is likely due to reactive LA  #4 Screening/: will check HIV and HCV   12/12/2015, 2:21 PM   Thank you so much for this interesting consult  [place] [facility] [place] [phone] (pager) [phone] (office) 12/12/2015, 2:21 PM  Rhina Brackett Dam 12/12/2015, 2:21 PM

## 2015-12-12 NOTE — Progress Notes (Signed)
Pt Mother requesting Infectious Disease physician see patient today.  Reported to hospitalist, and hospitalist states she spoke directly with Dr. Daiva EvesVan Dam this morning, and he will see patient this afternoon.  Notified patient, her Mother, and pt Husband at bedside.  Patient and family report no other concerns or questions at this time.

## 2015-12-12 NOTE — Progress Notes (Addendum)
Patient ID: Sarah Stokes, female   DOB: 10-25-1985, 30 y.o.   MRN: [id number]  PROGRESS NOTE    Sarah Stokes  [id number]:[id number] DOB: [birth year] DOA: 12/09/2015  PCP: Neena Rhymes, MD   Brief Narrative:  30 y.o. Female with no significant past medical history. She presented to [facility] with swelling, redness and pain around R eye that was noted when she woke up, she was seen in Urgent care and prescribed Keflex and ciprofloxacin eyedrops but her symptoms continued to get worse and came to ER same evening. She reports fever prior to admission and diarrhea about 1 week prior to this, now resolved. Patient was hemodynamically stable in ED. Maxillofacial CT revealed right periorbital cellulitis without evidence of fluid or abscess collection. She was started on clindamycin Redness around her Eye, swelling, pain better, then developed an area of induration in middle of L forearm and 2 lesions on her fingers 6/2 Overnight had swelling and redness on L side of neck which is now better. No fevers since reported fever prior to admission  Assessment & Plan:   Principal Problem: Periorbital cellulitis of right eye - Unclear etiology. Patient does not recall any trauma, insect bite - appears to be improving  - does not need I&D -On IV clindamycin day 3  New areas of Skin inflammation -unclear etiology -some improving by itself -allergy is also a possibility, but not diffuse and some improving while on Abx -?Stop Abx and monitor, have asked ID for input  Hypokalemia - Supplemented  H/o ADHD -on adderal  DVT prophylaxis: Lovenox subcutaneous Code Status: full code  Family Communication: mother  At bedside Disposition Plan: Home when improved   Consultants:   ID Dr.Van Dam   Procedures:   None   Antimicrobials:   Clindamycin 12/09/2015 -->   Subjective: Swelling and redness around R eye better, overnight had swelling and redness on L side of neck, arm better,  finger lesions less prominent  Objective: Filed Vitals:   12/11/15 2028 12/11/15 2358 12/12/15 0526 12/12/15 0950  BP: 112/62 107/56 99/59 105/46  Pulse: 62 59 59 64  Temp: 98.7 F (37.1 C) 98.7 F (37.1 C) 98.7 F (37.1 C) 98.4 F (36.9 C)  TempSrc: Oral Oral Oral Oral  Resp: Height:      Weight:      SpO2: 100% 98% 97% 98%    Intake/Output Summary (Last 24 hours) at 12/12/15 1031 Last data filed at 12/11/15 1200  Gross per 24 hour  Intake    120 ml  Output      0 ml  Net    120 ml   Filed Weights   12/09/15 1828  Weight: 65.772 kg (145 lb)    Examination:  General exam: Appears calm and comfortable; mild periorbital swelling and redness around right eye, able to open it completely today,  vision and extraocular movements intact  Respiratory system: Clear to auscultation. Respiratory effort normal. Cardiovascular system: S1 & S2 heard, RRR.  Gastrointestinal system: Abdomen is nondistended, soft and nontender. No organomegaly or masses felt. Normal bowel sounds heard. Central nervous system: Alert and oriented. No focal neurological deficits. Extremities: Symmetric 5 x 5 power. Skin: Area of erythema on L forearm better, small pin point lesions on 2 fingers of hand Psychiatry: Judgement and insight appear normal. Mood & affect appropriate.   Data Reviewed: I have personally reviewed following labs and imaging studies  CBC:  Recent Labs Lab 12/09/15 1936 12/10/15 0536 12/11/15  [id number] 12/12/15 0523  WBC 11.4* 9.5 7.7 8.4  NEUTROABS 7.5  --   --   --   HGB 14.3 12.1 12.0 11.7*  HCT 42.1 37.0 37.3 36.9  MCV 83.9 86.2 87.8 85.4  PLT 317 283 270 270   Basic Metabolic Panel:  Recent Labs Lab 12/09/15 1936 12/10/15 0536 12/11/15 0512 12/12/15 0523  NA 140 137 138 138  K 3.3* 3.2* 4.1 3.5  CL 106 108 108 105  CO2 26 22 24 26   GLUCOSE 119* 113* 105* 100*  BUN 6 7 7 8   CREATININE 0.86 0.84 0.90 0.96  CALCIUM 9.7 8.6* 8.9 8.8*    GFR: Estimated Creatinine Clearance: 76.3 mL/min (by C-G formula based on Cr of 0.96). Liver Function Tests:  Recent Labs Lab 12/12/15 0534  AST 17  ALT 16  ALKPHOS 50  BILITOT 0.3  PROT 6.3*  ALBUMIN 3.4*   No results for input(s): LIPASE, AMYLASE in the last 168 hours. No results for input(s): AMMONIA in the last 168 hours. Coagulation Profile: No results for input(s): INR, PROTIME in the last 168 hours. Cardiac Enzymes: No results for input(s): CKTOTAL, CKMB, CKMBINDEX, TROPONINI in the last 168 hours. BNP (last 3 results) No results for input(s): PROBNP in the last 8760 hours. HbA1C: No results for input(s): HGBA1C in the last 72 hours. CBG: No results for input(s): GLUCAP in the last 168 hours. Lipid Profile: No results for input(s): CHOL, HDL, LDLCALC, TRIG, CHOLHDL, LDLDIRECT in the last 72 hours. Thyroid Function Tests: No results for input(s): TSH, T4TOTAL, FREET4, T3FREE, THYROIDAB in the last 72 hours. Anemia Panel: No results for input(s): VITAMINB12, FOLATE, FERRITIN, TIBC, IRON, RETICCTPCT in the last 72 hours. Urine analysis:    Component Value Date/Time   COLORURINE YELLOW 08/24/2015 0005   APPEARANCEUR CLEAR 08/24/2015 0005   LABSPEC 1.010 08/24/2015 0005   PHURINE 7.0 08/24/2015 0005   GLUCOSEU NEGATIVE 08/24/2015 0005   HGBUR NEGATIVE 08/24/2015 0005   HGBUR large 06/08/2009 1600   BILIRUBINUR NEGATIVE 08/24/2015 0005   KETONESUR NEGATIVE 08/24/2015 0005   PROTEINUR NEGATIVE 08/24/2015 0005   UROBILINOGEN 0.2 07/08/2013 1730   NITRITE NEGATIVE 08/24/2015 0005   LEUKOCYTESUR TRACE* 08/24/2015 0005   Sepsis Labs: @LABRCNTIP (procalcitonin:4,lacticidven:4)   )No results found for this or any previous visit (from the past 240 hour(s)).    Radiology Studies: Ct Maxillofacial W/cm 12/09/2015  Soft tissue swelling involving the right face and right periorbital tissues. Findings could represent preseptal cellulitis. No evidence for a focal fluid  or abscess collection. Few prominent lymph nodes in the right submandibular region and probably in the right parotid gland. These are likely reactive in etiology.    Scheduled Meds: . amphetamine-dextroamphetamine  25 mg Oral Daily  . clindamycin (CLEOCIN) IV  600 mg Intravenous Q8H  . docusate sodium  100 mg Oral BID  . enoxaparin (LOVENOX) injection  40 mg Subcutaneous QHS  . prenatal multivitamin  1 tablet Oral Q1200   Continuous Infusions: . sodium chloride 10 mL/hr at 12/10/15 1259     LOS: 2 days  Time spent: 35 minutes  Greater than 50% of the time spent on counseling and coordinating the care.   Zannie CoveJOSEPH,Rylin Saez, MD [facility] Pager [phone]  If 7PM-7AM, please contact night-coverage [email or web address] Password [email or web address] 12/12/2015, 10:31 AM

## 2015-12-12 NOTE — Progress Notes (Signed)
New onset swelling to L neck. Throbbing/aching pain because of the pressure. No affect on breathing at this time. Will continue to monitor.  Update on cellulitis areas: (per pt)  R Eye- improving R cheek- increasing in swelling and pressure pain L arm- same amount of swelling, however, moved slightly; still itchy and pressure pain L middle finger- same L pinky finger- decreased

## 2015-12-13 DIAGNOSIS — M7989 Other specified soft tissue disorders: Secondary | ICD-10-CM

## 2015-12-13 LAB — COMPREHENSIVE METABOLIC PANEL
ALK PHOS: 57 U/L (ref 38–126)
ALT: 16 U/L (ref 14–54)
AST: 19 U/L (ref 15–41)
Albumin: 3.5 g/dL (ref 3.5–5.0)
Anion gap: 8 (ref 5–15)
BUN: 12 mg/dL (ref 6–20)
CALCIUM: 9 mg/dL (ref 8.9–10.3)
CHLORIDE: 104 mmol/L (ref 101–111)
CO2: 25 mmol/L (ref 22–32)
CREATININE: 0.9 mg/dL (ref 0.44–1.00)
GFR calc Af Amer: >60 mL/min (ref >60)
Glucose, Bld: 100 mg/dL — ABNORMAL HIGH (ref 65–99)
Potassium: 3.7 mmol/L (ref 3.5–5.1)
Sodium: 137 mmol/L (ref 135–145)
Total Bilirubin: 0.3 mg/dL (ref 0.3–1.2)
Total Protein: 6.7 g/dL (ref 6.5–8.1)

## 2015-12-13 LAB — C-REACTIVE PROTEIN: CRP: 0.6 mg/dL (ref <1.0)

## 2015-12-13 LAB — CBC
HCT: 39.5 % (ref 36.0–46.0)
Hemoglobin: 12.8 g/dL (ref 12.0–15.0)
MCH: 28.1 pg (ref 26.0–34.0)
MCHC: 32.4 g/dL (ref 30.0–36.0)
MCV: 86.6 fL (ref 78.0–100.0)
PLATELETS: 293 10*3/uL (ref 150–400)
RBC: 4.56 MIL/uL (ref 3.87–5.11)
RDW: 13 % (ref 11.5–15.5)
WBC: 8.8 10*3/uL (ref 4.0–10.5)

## 2015-12-13 LAB — SEDIMENTATION RATE: SED RATE: 18 mm/h (ref 0–22)

## 2015-12-13 MED ORDER — CLINDAMYCIN HCL 300 MG PO CAPS: 300.0000 mg | ORAL_CAPSULE | Freq: Three times a day (TID) | ORAL | Status: DC

## 2015-12-13 NOTE — Progress Notes (Signed)
Pt seen and examined, full note to follow Doing well, no issues overnight, ESR normal, no fevers, labs unremarkable ECHO normal Suspect lesion on L forearm and 2 fingers are reactions to clindamycin, improving well, uptodate lists multiple dermatological reactions to Clindamycin. DC home today, Home on few more days of PO Clindamycin  Domenic Polite, MD

## 2015-12-13 NOTE — Discharge Summary (Signed)
Physician Discharge Summary  Chevon Fomby MVH:[id number] DOB: 1986-03-21 DOA: 12/09/2015  PCP: Annye Asa, MD  Admit date: 12/09/2015 Discharge date: 12/13/2015  Time spent:35 minutes  Recommendations for Outpatient Follow-up:  1. Dr.Tabori in 1 week 2. Mild hyperglycemia noted, please check Hbaic upo FU   Discharge Diagnoses:  Principal Problem:   Periorbital cellulitis of right eye Active Problems:   Hypokalemia   Rash and nonspecific skin eruption   Finger swelling   Neck pain   Mild hyperglycemia  Discharge Condition:stable  Diet recommendation: regular  Filed Weights   12/09/15 1828  Weight: 65.772 kg (145 lb)    History of present illness:  30 y.o. Female with no significant past medical history. She presented to [facility] hospital with swelling, redness and pain around R eye that was noted when she woke up, she was seen in Urgent care and prescribed Keflex and ciprofloxacin eyedrops but her symptoms continued to get worse and came to ER same evening. She reports fever prior to admission and diarrhea about 1 week prior to this, now resolved. Patient was hemodynamically stable in ED. Maxillofacial CT revealed right periorbital cellulitis without evidence of fluid or abscess collection. She was started on clindamycin  Hospital Course:  Periorbital cellulitis of right eye - no h/o trauma, insect bite, DM - improving on IV clindamycin - continue this for 58moe days PO and FU with Dr.Tabori per PCP  3 skin lesions, maculopapular -one of L forearm and 2 on 2 fingers -noted 2days after admission, self limiting and improving -seen by Dr.Van Dam in consult, improving, ESR normal and 2d ECHO normal -suspect these are a mild dermatological reaction to clindamycin, although self limiting at this time, no evidence of TEN or SJ syndrome, since periorbital cellulitis much improved and these spots are resolving will continue CLinda for 345mo days post discharge    Hypokalemia -noted on admission - Supplemented  H/o ADHD -on adderal Consultations:  ID: Dr.Van Dam  Discharge Exam: Filed Vitals:   12/12/15 2131 12/13/15 0535  BP: 112/62 100/52  Pulse: 74 64  Temp: 98.4 F (36.9 C) 97.9 F (36.6 C)  Resp: 17 16    General:AAOx3 Cardiovascular: S1S2/RRR Respiratory: CTAB  Discharge Instructions    Current Discharge Medication List    START taking these medications   Details  clindamycin (CLEOCIN) 300 MG capsule Take 1 capsule (300 mg total) by mouth 3 (three) times daily. For 4days Qty: 12 capsule, Refills: 0      CONTINUE these medications which have NOT CHANGED   Details  amphetamine-dextroamphetamine (ADDERALL XR) 25 MG 24 hr capsule Take 1 capsule by mouth every morning. Qty: 30 capsule, Refills: 0    cetirizine (ZYRTEC) 10 MG tablet Take 10 mg by mouth daily as needed for allergies.    LO LOESTRIN FE 1 MG-10 MCG / 10 MCG tablet Take 1 tablet by mouth daily. as directed Refills: 1    Prenatal Vit-Fe Fumarate-FA (PRENATAL MULTIVITAMIN) TABS tablet Take 1 tablet by mouth daily at 12 noon.      STOP taking these medications     cephALEXin (KEFLEX) 500 MG capsule      ciprofloxacin (CILOXAN) 0.3 % ophthalmic solution      ibuprofen (ADVIL,MOTRIN) 600 MG tablet        Allergies  Allergen Reactions  . Ambien [Zolpidem Tartrate]     Hallucinations  . Percocet [Oxycodone-Acetaminophen] Hives and Nausea And Vomiting  . Vicodin [Hydrocodone-Acetaminophen] Hives and Nausea And Vomiting  . Adhesive [Tape]  Rash    Tape from epidural    Follow-up Information    Follow up with Annye Asa, MD. Schedule an appointment as soon as possible for a visit in 1 week.   Specialty:  Family Medicine   Contact information:   [address] [place] [address] [phone]        The results of significant diagnostics from this hospitalization (including imaging, microbiology, ancillary and laboratory) are  listed below for reference.    Significant Diagnostic Studies: Ct Maxillofacial W/cm  12/09/2015  CLINICAL DATA:  Right eye swelling. EXAM: CT MAXILLOFACIAL WITH CONTRAST TECHNIQUE: Multidetector CT imaging of the maxillofacial structures was performed with intravenous contrast. Multiplanar CT image reconstructions were also generated. A small metallic BB was placed on the right temple in order to reliably differentiate right from left. CONTRAST:  45m ISOVUE-300 IOPAMIDOL (ISOVUE-300) INJECTION 61% COMPARISON:  None. FINDINGS: Visualized intracranial structures are unremarkable. There is flow in the major intracerebral vascular structures. There is extensive edema in the right periorbital and right preseptal region. Normal appearance of both globes. Normal appearance of the retrobulbar fat in both orbits. There is asymmetric soft tissues thickening involving the right side of the face and cheek. Symmetric appearance of the submandibular glands. Small nodular structures in the right parotid gland probably represent lymph nodes. A few prominent right submandibular lymph nodes which are asymmetric. Negative for a facial bone fracture. Mandibular condyles are located. There may be hypoplasia of the right mastoid air cells. Left mastoid air cells are aerated. Paranasal sinuses are clear. Pterygoid plates are intact. Normal alignment of the upper cervical spine without acute bone abnormality. IMPRESSION: Soft tissue swelling involving the right face and right periorbital tissues. Findings could represent preseptal cellulitis. No evidence for a focal fluid or abscess collection. Few prominent lymph nodes in the right submandibular region and probably in the right parotid gland. These are likely reactive in etiology. Electronically Signed   By: AMarkus DaftM.D.   On: 12/09/2015 21:17    Microbiology: No results found for this or any previous visit (from the past 240 hour(s)).   Labs: Basic Metabolic  Panel:  Recent Labs Lab 12/09/15 1936 12/10/15 0536 12/11/15 0512 12/12/15 0523 12/13/15 0510  NA 140 137 138 138 137  K 3.3* 3.2* 4.1 3.5 3.7  CL 106 108 108 105 104  CO2 _0 GLUCOSE 119* 113* 105* 100* 100*  BUN _1 CREATININE 0.86 0.84 0.90 0.96 0.90  CALCIUM 9.7 8.6* 8.9 8.8* 9.0   Liver Function Tests:  Recent Labs Lab 12/12/15 0534 12/13/15 0510  AST 17 19  ALT 16 16  ALKPHOS 50 57  BILITOT 0.3 0.3  PROT 6.3* 6.7  ALBUMIN 3.4* 3.5   No results for input(s): LIPASE, AMYLASE in the last 168 hours. No results for input(s): AMMONIA in the last 168 hours. CBC:  Recent Labs Lab 12/09/15 1936 12/10/15 0536 12/11/15 0512 12/12/15 0523 12/13/15 0510  WBC 11.4* 9.5 7.7 8.4 8.8  NEUTROABS 7.5  --   --   --   --   HGB 14.3 12.1 12.0 11.7* 12.8  HCT 42.1 37.0 37.3 36.9 39.5  MCV 83.9 86.2 87.8 85.4 86.6  PLT 317 283 270 270 293   Cardiac Enzymes: No results for input(s): CKTOTAL, CKMB, CKMBINDEX, TROPONINI in the last 168 hours. BNP: BNP (last 3 results) No results for input(s): BNP in the last 8760 hours.  ProBNP (  last 3 results) No results for input(s): PROBNP in the last 8760 hours.  CBG: No results for input(s): GLUCAP in the last 168 hours.     SignedDomenic Polite MD.  [facility] 12/13/2015, 10:24 AM

## 2015-12-14 ENCOUNTER — Telehealth: Payer: Self-pay | Admitting: *Deleted

## 2015-12-14 LAB — HIV ANTIBODY (ROUTINE TESTING W REFLEX): HIV Screen 4th Generation wRfx: NONREACTIVE

## 2015-12-14 LAB — HCV COMMENT:

## 2015-12-14 LAB — HEPATITIS C ANTIBODY (REFLEX): HCV Ab: <0.1 s/co ratio (ref 0.0–0.9)

## 2015-12-14 NOTE — Telephone Encounter (Signed)
Unable to reach patient at time of TCM call. Left message for patient to return my call when available.

## 2015-12-15 NOTE — Telephone Encounter (Signed)
Unable to reach patient at time of TCM call. Left message for patient to return call when available.   

## 2015-12-17 ENCOUNTER — Encounter: Payer: Self-pay | Admitting: Family Medicine

## 2015-12-17 ENCOUNTER — Ambulatory Visit (INDEPENDENT_AMBULATORY_CARE_PROVIDER_SITE_OTHER): Payer: BLUE CROSS/BLUE SHIELD | Admitting: Family Medicine

## 2015-12-17 VITALS — BP 118/72 | HR 72 | Temp 98.0°F | Resp 16 | Ht 62.0 in | Wt 151.4 lb

## 2015-12-17 DIAGNOSIS — L03213 Periorbital cellulitis: Secondary | ICD-10-CM | POA: Diagnosis not present

## 2015-12-17 DIAGNOSIS — F909 Attention-deficit hyperactivity disorder, unspecified type: Secondary | ICD-10-CM | POA: Diagnosis not present

## 2015-12-17 DIAGNOSIS — F988 Other specified behavioral and emotional disorders with onset usually occurring in childhood and adolescence: Secondary | ICD-10-CM

## 2015-12-17 MED ORDER — AMPHETAMINE-DEXTROAMPHET ER 25 MG PO CP24: 25.0000 mg | ORAL_CAPSULE | ORAL | Status: DC

## 2015-12-17 NOTE — Patient Instructions (Signed)
Follow up as needed You look great!  No need for additional antibiotics Continue the Probiotics Continue the Zyrtec daily and add the Flonase to improve the ear congestion Drink plenty of fluids Call with any questions or concerns Hang in there!!!

## 2015-12-17 NOTE — Progress Notes (Signed)
Pre visit review using our clinic review tool, if applicable. No additional management support is needed unless otherwise documented below in the visit note. 

## 2015-12-17 NOTE — Assessment & Plan Note (Signed)
New to provider.  Pt has completed her course of Clinda after her hospitalization.  Nausea has improved since taking last pill yesterday.  No evidence of recurrent infxn.  She does have evidence of untreated seasonal allergies which may cause her to rub her eyes and risk infxn.  Start Flonase.  Reviewed supportive care and red flags that should prompt return.  Pt expressed understanding and is in agreement w/ plan.

## 2015-12-17 NOTE — Progress Notes (Signed)
   Subjective:    Patient ID: Sarah Stokes, female    DOB: 03/08/1986, 30 y.o.   MRN: [id number]  HPI Hospital f/u- pt was admitted 5/31-6/4 w/ periorbital cellulitis of R eye.  [facility] given ke[facility] and topical drops.  Eye swelling rapidly worsened and she went to ER where she admitted.  Finished Clinda yesterday.  No fevers.  No current eye swelling or redness.  Taking a probiotic which has limited the Clinda side effects- no diarrhea.  + vomiting during hospitalization, no vomiting since d/c but nausea.  This is better today since meds ended last night.  Red spots on forearm and hands have resolved.  D/c summary and labs reviewed.  ADHD- needs refill on meds.   Review of Systems For ROS see HPI     Objective:   Physical Exam  Constitutional: She is oriented to person, place, and time. She appears well-developed and well-nourished. No distress.  HENT:  Head: Normocephalic and atraumatic.  Right Ear: Tympanic membrane normal.  Left Ear: Tympanic membrane normal.  Nose: Mucosal edema and rhinorrhea present. Right sinus exhibits no maxillary sinus tenderness and no frontal sinus tenderness. Left sinus exhibits no maxillary sinus tenderness and no frontal sinus tenderness.  Mouth/Throat: Mucous membranes are normal. Posterior oropharyngeal erythema (w/ PND) present.  Eyes: Conjunctivae and EOM are normal. Pupils are equal, round, and reactive to light.  Neck: Normal range of motion. Neck supple.  Cardiovascular: Normal rate, regular rhythm and normal heart sounds.   Pulmonary/Chest: Effort normal and breath sounds normal. No respiratory distress. She has no wheezes. She has no rales.  Lymphadenopathy:    She has no cervical adenopathy.  Neurological: She is alert and oriented to person, place, and time.  Psychiatric: She has a normal mood and affect. Her behavior is normal. Thought content normal.  Vitals reviewed.         Assessment & Plan:

## 2015-12-17 NOTE — Assessment & Plan Note (Signed)
Refill provided

## 2015-12-21 ENCOUNTER — Encounter: Payer: Self-pay | Admitting: Family Medicine

## 2015-12-21 ENCOUNTER — Ambulatory Visit (INDEPENDENT_AMBULATORY_CARE_PROVIDER_SITE_OTHER): Payer: BLUE CROSS/BLUE SHIELD | Admitting: Family Medicine

## 2015-12-21 ENCOUNTER — Other Ambulatory Visit (INDEPENDENT_AMBULATORY_CARE_PROVIDER_SITE_OTHER): Payer: BLUE CROSS/BLUE SHIELD

## 2015-12-21 VITALS — BP 110/74 | HR 83 | Temp 98.3°F | Resp 17 | Ht 62.0 in | Wt 150.4 lb

## 2015-12-21 DIAGNOSIS — M7989 Other specified soft tissue disorders: Secondary | ICD-10-CM

## 2015-12-21 DIAGNOSIS — H02846 Edema of left eye, unspecified eyelid: Secondary | ICD-10-CM

## 2015-12-21 LAB — CBC WITH DIFFERENTIAL/PLATELET
BASOS ABS: 0 10*3/uL (ref 0.0–0.1)
Basophils Relative: 0.4 % (ref 0.0–3.0)
EOS ABS: 0.1 10*3/uL (ref 0.0–0.7)
Eosinophils Relative: 1.8 % (ref 0.0–5.0)
HCT: 41.2 % (ref 36.0–46.0)
Hemoglobin: 13.8 g/dL (ref 12.0–15.0)
LYMPHS ABS: 2.3 10*3/uL (ref 0.7–4.0)
Lymphocytes Relative: 29.7 % (ref 12.0–46.0)
MCHC: 33.4 g/dL (ref 30.0–36.0)
MCV: 83.7 fl (ref 78.0–100.0)
Monocytes Absolute: 0.4 10*3/uL (ref 0.1–1.0)
Monocytes Relative: 5.2 % (ref 3.0–12.0)
NEUTROS ABS: 4.9 10*3/uL (ref 1.4–7.7)
NEUTROS PCT: 62.9 % (ref 43.0–77.0)
PLATELETS: 283 10*3/uL (ref 150.0–400.0)
RBC: 4.92 Mil/uL (ref 3.87–5.11)
RDW: 12.9 % (ref 11.5–15.5)
WBC: 7.9 10*3/uL (ref 4.0–10.5)

## 2015-12-21 LAB — SEDIMENTATION RATE: Sed Rate: 11 mm/hr (ref 0–20)

## 2015-12-21 MED ORDER — DOXYCYCLINE HYCLATE 100 MG PO TABS: 100.0000 mg | ORAL_TABLET | Freq: Two times a day (BID) | ORAL | Status: DC

## 2015-12-21 MED ORDER — PREDNISONE 10 MG PO TABS: ORAL_TABLET | ORAL | Status: DC

## 2015-12-21 NOTE — Patient Instructions (Signed)
Follow up by phone or come back and see me if anything changes or worsens Go to the [facility] at [address] and get your blood work done Exelon CorporationStart the Doxycycline twice daily- take w/ food (avoid direct and prolonged sun exposure) Alternate warm or cool compresses to help the swelling in the eye Call with any questions or concerns Hang in there!!!

## 2015-12-21 NOTE — Progress Notes (Signed)
   Subjective:    Patient ID: Sarah Stokes, female    DOB: 11/21/1985, 30 y.o.   MRN: [id number]  HPI L eye swelling- woke this AM w/ L upper eyelid swelling and L middle finger redness and swelling.  Finished Clindamycin last week after being hospitalized for R periorbital cellulitis.  Pt reports she had similar redness and swelling of L hand when R eye was involved.  No changes to makeups, lotions, detergents.  L eye has sense of pressure but is not painful.  No fevers.     Review of Systems For ROS see HPI     Objective:   Physical Exam  Constitutional: She is oriented to person, place, and time. She appears well-developed and well-nourished. No distress.  HENT:  Head: Normocephalic and atraumatic.  Nose: Nose normal.  Mouth/Throat: Oropharynx is clear and moist.  TMs WNL bilaterally  Eyes: Conjunctivae and EOM are normal. Pupils are equal, round, and reactive to light. Right eye exhibits no discharge. Left eye exhibits no discharge.  L upper eyelid swelling w/o redness or warmth, no evidence of a stye  Neck: Normal range of motion. Neck supple.  Lymphadenopathy:    She has no cervical adenopathy.  Neurological: She is alert and oriented to person, place, and time.  Skin: Skin is warm. There is erythema (redness and warmth over L 3rd MCP).  Vitals reviewed.         Assessment & Plan:

## 2015-12-21 NOTE — Assessment & Plan Note (Signed)
New.  Pt has what appears to be cellulitis of L 3rd finger.  She reports she had similar redness and swelling prior to her R eye becoming swollen and cellulitic 2 weeks ago for which she was hospitalized.  She completed her course of Clindamycin and was doing well until she woke up this AM w/ her finger again red, warm, swollen, and itchy/painful.  L upper eyelid is also swollen.  No other rashes or lesions.  Start Doxy.  Check labs- including blood culture and ANA.  Will follow closely.

## 2015-12-21 NOTE — Assessment & Plan Note (Signed)
New.  No evidence of redness or infxn at this time but this is the same presentation she had prior to her peri-orbital cellulitis hospitalization.  Start Doxy.  Check labs to r/o infxn, auto immune process.  Will forward note to ID as they saw her in the hospital.  Will follow closely.

## 2015-12-21 NOTE — Progress Notes (Signed)
Pre visit review using our clinic review tool, if applicable. No additional management support is needed unless otherwise documented below in the visit note. 

## 2015-12-22 ENCOUNTER — Telehealth: Payer: Self-pay | Admitting: Family Medicine

## 2015-12-22 ENCOUNTER — Encounter: Payer: Self-pay | Admitting: Family Medicine

## 2015-12-22 LAB — ANA: Anti Nuclear Antibody(ANA): NEGATIVE

## 2015-12-22 NOTE — Telephone Encounter (Signed)
Patient states she had lab work done yesterday, she contacted [facility] to get the results of her lab work.  However, [facility] informed her they could not give her the results and referred her back to pcp office.  Please follow up with patient on lab results from yesterday.(12/21/15)

## 2015-12-23 ENCOUNTER — Encounter: Payer: Self-pay | Admitting: Family Medicine

## 2015-12-23 NOTE — Telephone Encounter (Signed)
Pt informed of results on mychart.

## 2016-01-27 ENCOUNTER — Other Ambulatory Visit: Payer: Self-pay | Admitting: Family Medicine

## 2016-01-27 MED ORDER — AMPHETAMINE-DEXTROAMPHET ER 25 MG PO CP24: 25.0000 mg | ORAL_CAPSULE | ORAL | Status: DC

## 2016-01-27 NOTE — Telephone Encounter (Signed)
Last OV 12/21/15 adderall last filled 12/17/15 #30 with 0

## 2016-01-27 NOTE — Telephone Encounter (Signed)
Pt informed available at front desk for pick up.  

## 2016-01-27 NOTE — Telephone Encounter (Signed)
Pt needs refill on adderall °

## 2016-01-28 ENCOUNTER — Encounter: Payer: Self-pay | Admitting: Allergy and Immunology

## 2016-01-28 ENCOUNTER — Ambulatory Visit (INDEPENDENT_AMBULATORY_CARE_PROVIDER_SITE_OTHER): Payer: BLUE CROSS/BLUE SHIELD | Admitting: Allergy and Immunology

## 2016-01-28 VITALS — BP 112/80 | HR 84 | Temp 98.2°F | Resp 16 | Ht 61.5 in | Wt 148.2 lb

## 2016-01-28 DIAGNOSIS — H1010 Acute atopic conjunctivitis, unspecified eye: Secondary | ICD-10-CM | POA: Diagnosis not present

## 2016-01-28 DIAGNOSIS — R05 Cough: Secondary | ICD-10-CM

## 2016-01-28 DIAGNOSIS — R609 Edema, unspecified: Secondary | ICD-10-CM

## 2016-01-28 DIAGNOSIS — J309 Allergic rhinitis, unspecified: Secondary | ICD-10-CM | POA: Diagnosis not present

## 2016-01-28 DIAGNOSIS — R059 Cough, unspecified: Secondary | ICD-10-CM

## 2016-01-28 MED ORDER — LEVOCETIRIZINE DIHYDROCHLORIDE 5 MG PO TABS: 5.0000 mg | ORAL_TABLET | Freq: Every evening | ORAL | Status: DC

## 2016-01-28 NOTE — Progress Notes (Signed)
NEW PATIENT NOTE  RE: Sarah Stokes MRN: [id number] DOB: 16-Mar-1986 [facility] [address] [place] [address] Date of Office Visit: 01/28/2016  Dear Sheliah Hatch, MD [address] [place], [place] [address]:  I had the pleasure of seeing Sarah Stokes today in initial evaluation as you recall-- Subjective:  Sarah Stokes is a 30 y.o. female who presents today for New Patient (Initial Visit) (hand has swollen twice with redness and warmth.)  Assessment:   1. Allergic rhinoconjunctivitis.   2. Cough in no respiratory distress with clear lung exam and normal in office spirometry--probable resolving viral respiratory illness.  3. Left hand swelling and 2 separate eye swelling episodes (preseptal cellulitis without sinusitis).   4.      Negative selective food testing. 5.      Patient concern for relationship to wedding ring--contact dermatitis with unclear association to eye. 6.      Corrective eye glass and contact lens wearer---now only glasses. Plan:   Meds ordered this encounter  Medications  . levocetirizine (XYZAL) 5 MG tablet    Sig: Take 1 tablet (5 mg total) by mouth every evening.    Dispense:  30 tablet    Refill:  5  1. Avoidance: Mite, Mold and Pollen 2. Antihistamine: Xyzal  by mouth once daily for runny nose or itching. 3. Nasal Spray: Rhinocort 1-2 spray(s) each nostril once daily for stuffy nose or drainage.  4.  Mucinex twice daily as needed. 5. If new episodes of skin changes/rash/swelling take picture and document environment, exposure, ingestion, activity. 6. Other: Consider additional lab testing and possible Metal or True patch testing. 7. Nasal Saline wash twice daily and as needed. 8. Follow up Visit:  2 months or sooner if needed.  HPI: Sarah Stokes presents to the office in initial evaluation for allergies.  She describes year round rhinorrhea, nasal congestion, sneezing, post nasal drip  occasionally associated with cough,  She denies any wheeze, chest congestion, difficulty in breathing or shortness or activity induced symptoms.  Generally feels pollen, dust, outdoors and fluctuant weather patterns are provoking factors to her symptoms--which have lessened with Claritin-D or tylenol sinus. She has had cough in the recent days and family members with acute cough illnesses as well.    However she has a new concern for swelling episode twice in the recent months (shows pictures today).  The first was about May 2017 with significant changes to the right eye and left hand without evidence of scratch, bite, injury or trigger to infection.  She was treated with antibiotics with hospitalization due to swelling=diagnosis of periorbital cellulitis.  She understood the sinus CT scan and lab testing were negative.  The second episode in June was the left eye and left hand, though milder.  She had an ID and Rheumatology consult which she understood were negative.  She denies lip/tongue/throat swelling, change in breathing or swallowing, abdominal pain, diarrhea or vomiting.  She denies tick bite and has only been wearing glasses (no contact lenses) since the first episode.  She also stopped wearing her white gold wedding ring but can not correlate to other swelling outside of her ring finger.  She denies any history of metal sensitivity or even costume jewelry difficulty.  Medical History: Past Medical History:  Diagnosis Date  . Anxiety   . Hx of varicella   . Migraine 2006  . Periorbital cellulitis   . Postpartum care following cesarean delivery (10/6) 04/16/2013  .  Status post primary low transverse cesarean section 04/15/2013   Surgical History: Past Surgical History:  Procedure Laterality Date  . CESAREAN SECTION N/A 04/15/2013   Procedure: CESAREAN SECTION;  Surgeon: Robley FriesVaishali R Mody, MD;  Location: [facility];  Service: Obstetrics;  Laterality: N/A;  . CESAREAN SECTION N/A 08/24/2015    Procedure: CESAREAN SECTION;  Surgeon: Marcelle OverlieMichelle Grewal, MD;  Location: [facility];  Service: Obstetrics;  Laterality: N/A;  REPEAT EDC 08/28/15   . HIP SURGERY     took bone for knee  . KNEE SURGERY     left x6, Dr. Eulah PontMurphy  . labial repair  2006  . TONSILLECTOMY     Family History: Family History  Problem Relation Age of Onset  . Thyroid disease Mother   . Cancer Mother     melanoma  . Allergic rhinitis Mother   . Diabetes Maternal Aunt   . Diabetes Maternal Uncle   . Thyroid disease Paternal Grandmother   . Allergic rhinitis Father   . Allergic rhinitis Sister   . Allergic rhinitis Brother    Social History: Social History  . Marital Status: Married    Spouse Name: N/A  . Number of Children: 2  . Years of Education: N/A   Social History Main Topics  . Smoking status: Never Smoker   . Smokeless tobacco: Never Used  . Alcohol Use: No  . Drug Use: No  . 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    Sarah Stokes has a current medication list which includes the following prescription(s): amphetamine-dextroamphetamine, lo loestrin fe, loratadine-pseudoephedrine, probiotic product.   Drug Allergies: Allergies  Allergen Reactions  . Ambien [Zolpidem Tartrate]     Hallucinations  . Percocet [Oxycodone-Acetaminophen] Hives and Nausea And Vomiting  . Vicodin [Hydrocodone-Acetaminophen] Hives and Nausea And Vomiting  . Adhesive [Tape] Rash    Tape from epidural    Environmental History: Sarah RayaConstance lives in a 30 year old house with wood floors, with central heat and air; stuffed mattress, non-feather pillow/comforter with bedroom humidifier, indoor dog without smokers.   Review of Systems  Constitutional: Negative for fever, malaise/fatigue and weight loss.  HENT: Positive for congestion. Negative for ear pain, hearing loss, nosebleeds and sore throat.   Eyes: Negative for  discharge and redness.  Respiratory: Negative for shortness of breath.        Denies history of bronchitis and pneumonia.  Gastrointestinal: Negative for abdominal pain, constipation, diarrhea, heartburn, nausea and vomiting.  Genitourinary: Negative.   Musculoskeletal: Negative for joint pain and myalgias.  Skin: Negative.  Negative for itching and rash.  Neurological: Negative.  Negative for dizziness, seizures, weakness and headaches.  Endo/Heme/Allergies: Positive for environmental allergies.       Denies sensitivity to aspirin, NSAIDs, stinging insects, foods, latex, jewelry and cosmetics.  Immunological: No chronic or recurring infections. Objective:   Vitals:   01/28/16 0946  BP: 112/80  Pulse: 84  Resp: 16  Temp: 98.2 F (36.8 C)   SpO2 Readings from Last 1 Encounters:  01/28/16 97%   Physical Exam  Constitutional: She is well-developed, well-nourished, and in no distress.  HENT:  Head: Atraumatic.  Right Ear: Tympanic membrane and ear canal normal.  Left Ear: Tympanic membrane and ear canal normal.  Nose: Mucosal edema present. No rhinorrhea. No epistaxis.  Mouth/Throat: Oropharynx is clear and moist and mucous membranes are normal. No oropharyngeal exudate,  posterior oropharyngeal edema or posterior oropharyngeal erythema.  Eyes: Conjunctivae are normal.  Neck: Neck supple.  Cardiovascular: Normal rate, S1 normal and S2 normal.   No murmur heard. Pulmonary/Chest: Effort normal. She has no wheezes. She has no rhonchi. She has no rales.  Abdominal: Soft. Normal appearance and bowel sounds are normal.  Musculoskeletal: She exhibits no edema.  Lymphadenopathy:    She has no cervical adenopathy.  Neurological: She is alert.  Skin: Skin is warm and intact. No rash noted. No cyanosis. Nails show no clubbing.   Diagnostics: Spirometry:  FVC 3.73--117%, FEV1 3.12--- 112%; postbronchodilator essentially no change.   Skin testing: Strong reactivity to selected grass and  tree pollens, mild reactivity to dust mite, weed pollens and selected molds.    Shyteria Lewis M. Willa Rough, MD   cc: Neena Rhymes, MD

## 2016-01-28 NOTE — Patient Instructions (Signed)
Take Home Sheet  1. Avoidance: Mite, Mold and Pollen   2. Antihistamine: Xyzal  by mouth once daily for runny nose or itching.   3. Nasal Spray: Rhinocort 1-2 spray(s) each nostril once daily for stuffy nose or drainage.    4.  Mucinex twice daily as needed.   5. If new episodes of skin changes/rash/swelling take picture and document environment, exposure, ingestion, activity.   6. Other: Consider additional lab testing and possible metal or True patch testing.   7. Nasal Saline wash twice daily and as needed.   8. Follow up Visit:  2 months or sooner if needed.   Websites that have reliable Patient information: 1. American Academy of Asthma, Allergy, & Immunology: www.aaaai.org 2. Food Allergy Network: www.foodallergy.org 3. Mothers of Asthmatics: www.aanma.org 4. [facility]: https://www.strong.com/ 5. American College of Allergy, Asthma, & Immunology: BiggerRewards.is or www.acaai.org  Control of House Dust Mite Allergen  House dust mites play a major role in allergic asthma and rhinitis.  They occur in environments with high humidity wherever human skin, the food for dust mites is found. High levels have been detected in dust obtained from mattresses, pillows, carpets, upholstered furniture, bed covers, clothes and soft toys.  The principal allergen of the house dust mite is found in its feces.  A gram of dust may contain 1,000 mites and 250,000 fecal particles.  Mite antigen is easily measured in the air during house cleaning activities.  1. Encase mattresses, including the box spring, and pillow, in an air tight cover.  Seal the zipper end of the encased mattresses with wide adhesive tape. 2. Wash the bedding in water of 130 degrees Farenheit weekly.  Avoid cotton comforters/quilts and flannel bedding: the most ideal bed covering is the dacron comforter. 3. Remove all upholstered furniture from the bedroom. 4. Remove carpets, carpet padding,  rugs, and non-washable window drapes from the bedroom.  Wash drapes weekly or use plastic window coverings. 5. Remove all non-washable stuffed toys from the bedroom.  Wash stuffed toys weekly. 6. Have the room cleaned frequently with a vacuum cleaner and a damp dust-mop.  The patient should not be in a room which is being cleaned and should wait 1 hour after cleaning before going into the room. 7. Close and seal all heating outlets in the bedroom.  Otherwise, the room will become filled with dust-laden air.  An electric heater can be used to heat the room. 8. Reduce indoor humidity to less than 50%.  Do not use a humidifier.  Reducing Pollen Exposure  The American Academy of Allergy, Asthma and Immunology suggests the following steps to reduce your exposure to pollen during allergy seasons.  9. Do not hang sheets or clothing out to dry; pollen may collect on these items. 10. Do not mow lawns or spend time around freshly cut grass; mowing stirs up pollen. 11. Keep windows closed at night.  Keep car windows closed while driving. 12. Minimize morning activities outdoors, a time when pollen counts are usually at their highest. 13. Stay indoors as much as possible when pollen counts or humidity is high and on windy days when pollen tends to remain in the air longer. 14. Use air conditioning when possible.  Many air conditioners have filters that trap the pollen spores. 15. Use a HEPA room air filter to remove pollen form the indoor air you breathe.  Control of Mold Allergen  Mold and fungi can grow on a variety of surfaces provided  certain temperature and moisture conditions exist.  Outdoor molds grow on plants, decaying vegetation and soil.  The major outdoor mold, Alternaria dn Cladosporium, are found in very high numbers during hot and dry conditions.  Generally, a late Summer - Fall peak is seen for common outdoor fungal spores.  Rain will temporarily lower outdoor mold spore count, but counts  rise rapidly when the rainy period ends.  The most important indoor molds are Aspergillus and Penicillium.  Dark, humid and poorly ventilated basements are ideal sites for mold growth.  The next most common sites of mold growth are the bathroom and the kitchen.  Outdoor MicrosoftMold Control 1. Use air conditioning and keep windows closed 2. Avoid exposure to decaying vegetation. 3. Avoid leaf raking. 4. Avoid grain handling. 5. Consider wearing a face mask if working in moldy areas.  Indoor Mold Control 1. Maintain humidity below 50%. 2. Clean washable surfaces with 5% bleach solution. 3. Remove sources e.g. Contaminated carpets.  Control of Cockroach Allergen  Cockroach allergen has been identified as an important cause of acute attacks of asthma, especially in urban settings.  There are fifty-five species of cockroach that exist in the [place], however only three, the [place], [place] and Oriental species produce allergen that can affect patients with Asthma.  Allergens can be obtained from fecal particles, egg casings and secretions from cockroaches.  1. Remove food sources. 2. Reduce access to water. 3. Seal access and entry points. 4. Spray runways with 0.5-1% Diazinon or Chlorpyrifos 5. Blow boric acid power under stoves and refrigerator. 6. Place bait stations (hydramethylnon) at feeding sites.

## 2016-02-01 ENCOUNTER — Encounter: Payer: Self-pay | Admitting: Allergy and Immunology

## 2016-03-03 ENCOUNTER — Other Ambulatory Visit: Payer: Self-pay | Admitting: Family Medicine

## 2016-03-03 MED ORDER — AMPHETAMINE-DEXTROAMPHET ER 25 MG PO CP24: 25.0000 mg | ORAL_CAPSULE | ORAL | 0 refills | Status: DC

## 2016-03-03 NOTE — Telephone Encounter (Signed)
Last OV 12/21/15 adderall last filled 01/27/16 #30 with 0

## 2016-03-03 NOTE — Telephone Encounter (Signed)
Med filled and pt made aware available at front desk for pick up.   

## 2016-03-03 NOTE — Telephone Encounter (Signed)
Pt needs refill on adderall °

## 2016-03-31 ENCOUNTER — Other Ambulatory Visit (INDEPENDENT_AMBULATORY_CARE_PROVIDER_SITE_OTHER): Payer: BLUE CROSS/BLUE SHIELD

## 2016-03-31 ENCOUNTER — Encounter: Payer: Self-pay | Admitting: Family Medicine

## 2016-03-31 ENCOUNTER — Ambulatory Visit (INDEPENDENT_AMBULATORY_CARE_PROVIDER_SITE_OTHER): Payer: BLUE CROSS/BLUE SHIELD | Admitting: Family Medicine

## 2016-03-31 VITALS — BP 116/78 | HR 85 | Temp 98.2°F | Resp 16 | Ht 62.0 in | Wt 139.0 lb

## 2016-03-31 DIAGNOSIS — R002 Palpitations: Secondary | ICD-10-CM | POA: Diagnosis not present

## 2016-03-31 DIAGNOSIS — R5383 Other fatigue: Secondary | ICD-10-CM

## 2016-03-31 LAB — CBC WITH DIFFERENTIAL/PLATELET
Basophils Absolute: 0 10*3/uL (ref 0.0–0.1)
Basophils Relative: 0.4 % (ref 0.0–3.0)
EOS PCT: 0.4 % (ref 0.0–5.0)
Eosinophils Absolute: 0 10*3/uL (ref 0.0–0.7)
HCT: 40.1 % (ref 36.0–46.0)
Hemoglobin: 13.6 g/dL (ref 12.0–15.0)
LYMPHS ABS: 2.5 10*3/uL (ref 0.7–4.0)
Lymphocytes Relative: 29.7 % (ref 12.0–46.0)
MCHC: 34 g/dL (ref 30.0–36.0)
MCV: 83.4 fl (ref 78.0–100.0)
MONO ABS: 0.4 10*3/uL (ref 0.1–1.0)
MONOS PCT: 4.9 % (ref 3.0–12.0)
NEUTROS ABS: 5.5 10*3/uL (ref 1.4–7.7)
NEUTROS PCT: 64.6 % (ref 43.0–77.0)
PLATELETS: 270 10*3/uL (ref 150.0–400.0)
RBC: 4.81 Mil/uL (ref 3.87–5.11)
RDW: 12.9 % (ref 11.5–15.5)
WBC: 8.4 10*3/uL (ref 4.0–10.5)

## 2016-03-31 LAB — BASIC METABOLIC PANEL
BUN: 10 mg/dL (ref 6–23)
CHLORIDE: 105 meq/L (ref 96–112)
CO2: 28 mEq/L (ref 19–32)
CREATININE: 0.91 mg/dL (ref 0.40–1.20)
Calcium: 9.4 mg/dL (ref 8.4–10.5)
GFR: 76.95 mL/min (ref >60.00)
GLUCOSE: 91 mg/dL (ref 70–99)
POTASSIUM: 3.6 meq/L (ref 3.5–5.1)
Sodium: 139 mEq/L (ref 135–145)

## 2016-03-31 LAB — B12 AND FOLATE PANEL
Folate: 13.1 ng/mL (ref >5.9)
Vitamin B-12: 340 pg/mL (ref 211–911)

## 2016-03-31 LAB — TSH: TSH: 0.73 u[IU]/mL (ref 0.35–4.50)

## 2016-03-31 LAB — VITAMIN D 25 HYDROXY (VIT D DEFICIENCY, FRACTURES): VITD: 34.22 ng/mL (ref 30.00–100.00)

## 2016-03-31 NOTE — Progress Notes (Signed)
   Subjective:    Patient ID: Sarah Stokes, female    DOB: 07/24/1985, 30 y.o.   MRN: [id number]  HPI Fatigue- 'i just feel depleted'.  Pt reports she will be exhausted even after a full night's sleep.  Increased irritability.  Will have intermittent dizziness w/ standing.  Having some palpitations when lying down at night.  No CP, SOB.  No new or different stressors.  Pt reports sleeping well at night.  Pt has lost 9 lbs since July by changing diet.  No fevers or night sweats.  Some intermittent anxiety.  Pt has increased water intake.   Review of Systems For ROS see HPI     Objective:   Physical Exam  Constitutional: She is oriented to person, place, and time. She appears well-developed and well-nourished. No distress.  HENT:  Head: Normocephalic and atraumatic.  Eyes: Conjunctivae and EOM are normal. Pupils are equal, round, and reactive to light.  Neck: Normal range of motion. Neck supple. No thyromegaly present.  Cardiovascular: Normal rate, regular rhythm, normal heart sounds and intact distal pulses.   No murmur heard. Pulmonary/Chest: Effort normal and breath sounds normal. No respiratory distress.  Abdominal: Soft. She exhibits no distension. There is no tenderness.  Musculoskeletal: She exhibits no edema.  Lymphadenopathy:    She has no cervical adenopathy.  Neurological: She is alert and oriented to person, place, and time.  Skin: Skin is warm and dry.  Psychiatric: She has a normal mood and affect. Her behavior is normal.  Vitals reviewed.         Assessment & Plan:  Fatigue- new.  Check labs to r/o metabolic cause.  Discussed possibility of anxiety w/ her irritability but that would be a dx of exclusion.  Encouraged increased water, regular exercise, and extra sleep as needed.  Will follow.  Palpitations- new.  Asymptomatic today.  Tends to occur when lying in bed at night which may again be related to anxiety but will check labs to r/o abnormal thyroid or anemia.   EKG WNL today in office.  Encouraged her to refrain from caffeine.  Will follow closely.

## 2016-03-31 NOTE — Progress Notes (Signed)
Pre visit review using our clinic review tool, if applicable. No additional management support is needed unless otherwise documented below in the visit note. 

## 2016-03-31 NOTE — Patient Instructions (Signed)
Go to our [place] office and get your labs done ([address])- you don't need an appt Continue to drink plenty of water I love that you have already limited caffeine- this can worsen palpitations If labs look good, we will consider the possibility of anxiety but this is a diagnosis of exclusion and we have to rule out everything else first Call with any questions or concerns Hang in there!!!

## 2016-04-01 ENCOUNTER — Encounter: Payer: Self-pay | Admitting: Family Medicine

## 2016-04-07 ENCOUNTER — Other Ambulatory Visit: Payer: Self-pay | Admitting: Family Medicine

## 2016-04-07 MED ORDER — AMPHETAMINE-DEXTROAMPHET ER 25 MG PO CP24: 25.0000 mg | ORAL_CAPSULE | ORAL | 0 refills | Status: DC

## 2016-04-07 NOTE — Telephone Encounter (Signed)
Pt needs refill on adderall °

## 2016-04-07 NOTE — Telephone Encounter (Signed)
Med filled and pt made aware available at front desk for pick up.   

## 2016-04-07 NOTE — Telephone Encounter (Signed)
Last OV 03/31/16 adderall last filled 03/03/16 #30 with 0

## 2016-05-16 ENCOUNTER — Other Ambulatory Visit: Payer: Self-pay | Admitting: Family Medicine

## 2016-05-16 NOTE — Telephone Encounter (Signed)
Pt needs refill on adderall °

## 2016-05-16 NOTE — Telephone Encounter (Signed)
Last OV 03/31/16 adderall last filled 04/07/16 #30 with 0

## 2016-05-17 MED ORDER — AMPHETAMINE-DEXTROAMPHET ER 25 MG PO CP24: 25.0000 mg | ORAL_CAPSULE | ORAL | 0 refills | Status: DC

## 2016-05-17 NOTE — Telephone Encounter (Signed)
Pt informed this rx is available at the front desk for pick up.  

## 2016-06-21 ENCOUNTER — Other Ambulatory Visit: Payer: Self-pay | Admitting: Family Medicine

## 2016-06-21 MED ORDER — AMPHETAMINE-DEXTROAMPHET ER 25 MG PO CP24: 25.0000 mg | ORAL_CAPSULE | ORAL | 0 refills | Status: DC

## 2016-06-21 NOTE — Telephone Encounter (Signed)
Pt needs refill on adderall °

## 2016-06-21 NOTE — Telephone Encounter (Signed)
Med filled and pt made aware rx was at front desk for pick up.

## 2016-06-21 NOTE — Telephone Encounter (Signed)
Last OV 03/31/16 adderall last filled 05/17/16 #30 with 0

## 2016-12-06 ENCOUNTER — Telehealth: Payer: Self-pay | Admitting: Family Medicine

## 2016-12-06 DIAGNOSIS — R21 Rash and other nonspecific skin eruption: Secondary | ICD-10-CM

## 2016-12-06 MED ORDER — PREDNISONE 10 MG PO TABS: ORAL_TABLET | ORAL | 0 refills | Status: DC

## 2016-12-06 NOTE — Telephone Encounter (Signed)
Ok for Prednisone taper- 10mg , 3x3 days, 2x3 days, 1 x3 days.  If no improvement in redness and swelling will need appt.  Ok for referral to Dr Dierdre ForthBeekman.

## 2016-12-06 NOTE — Telephone Encounter (Signed)
Pt made aware rx was filled and of PCP recommendations. Referral was placed as requested.

## 2016-12-06 NOTE — Telephone Encounter (Signed)
Pt states that her cellulitis is flaring back up on her hand and asking if prednisone could be called into [facility] on [address], also pt asking if she could get a referral to [facility] to see Dr Dierdre ForthBeekman

## 2017-01-30 ENCOUNTER — Telehealth: Payer: Self-pay | Admitting: Family Medicine

## 2017-01-30 NOTE — Telephone Encounter (Signed)
Pt states that she has a bug bite on her cheek/face and asking if KT would call in prednisone to [facility] on [address], pt states that she does have a rheum appt on 8/8.

## 2017-01-30 NOTE — Telephone Encounter (Signed)
Called and informed pt of PCP recommendations. Pt is going to check her schedule and call back to schedule.

## 2017-01-30 NOTE — Telephone Encounter (Signed)
With her history, would rather her be seen than just send Prednisone to make sure things are treated appropriately and there's not evidence of infxn

## 2017-01-30 NOTE — Telephone Encounter (Signed)
Please advise, should pt be seen given her history?

## 2017-01-31 ENCOUNTER — Encounter: Payer: Self-pay | Admitting: Physician Assistant

## 2017-01-31 ENCOUNTER — Ambulatory Visit (INDEPENDENT_AMBULATORY_CARE_PROVIDER_SITE_OTHER): Payer: BLUE CROSS/BLUE SHIELD | Admitting: Physician Assistant

## 2017-01-31 DIAGNOSIS — H578 Other specified disorders of eye and adnexa: Secondary | ICD-10-CM | POA: Diagnosis not present

## 2017-01-31 DIAGNOSIS — H5789 Other specified disorders of eye and adnexa: Secondary | ICD-10-CM | POA: Insufficient documentation

## 2017-01-31 MED ORDER — PREDNISONE 10 MG PO TABS
ORAL_TABLET | ORAL | 0 refills | Status: DC
Start: 2017-01-31 — End: 2017-05-19

## 2017-01-31 NOTE — Progress Notes (Signed)
Patient presents to clinic today c/o recurrence of swelling and warmth under her L eye after potential insect bite. Patient with history of robust immune responses after insect bites or scratches to skin. Has had prior episodes of periorbital cellulitis. Is wanting assessment and management prior to worsening of symptoms. Denies fever, chills. Denies malaise or fatigue. Denies vision changes or eye pain. Has upcoming appointment with specialist for further assessment.   Past Medical History:  Diagnosis Date  . Anxiety   . Hx of varicella   . Migraine 2006  . Periorbital cellulitis   . Postpartum care following cesarean delivery (10/6) 04/16/2013  . Status post primary low transverse cesarean section 04/15/2013    Current Outpatient Prescriptions on File Prior to Visit  Medication Sig Dispense Refill  . levocetirizine (XYZAL) 5 MG tablet Take 1 tablet (5 mg total) by mouth every evening. 30 tablet 5  . Probiotic Product (PROBIOTIC DAILY PO) Take by mouth.     No current facility-administered medications on file prior to visit.     Allergies  Allergen Reactions  . Ambien [Zolpidem Tartrate]     Hallucinations  . Percocet [Oxycodone-Acetaminophen] Hives and Nausea And Vomiting  . Vicodin [Hydrocodone-Acetaminophen] Hives and Nausea And Vomiting  . Adhesive [Tape] Rash    Tape from epidural     Family History  Problem Relation Age of Onset  . Thyroid disease Mother   . Cancer Mother        melanoma  . Allergic rhinitis Mother   . Diabetes Maternal Aunt   . Diabetes Maternal Uncle   . Thyroid disease Paternal Grandmother   . Allergic rhinitis Father   . Allergic rhinitis Sister   . Allergic rhinitis Brother     Social History   Social History  . Marital status: Married    Spouse name: N/A  . Number of children: N/A  . Years of education: N/A   Social History Main Topics  . Smoking status: Never Smoker  . Smokeless tobacco: Never Used  . Alcohol use No  . Drug use:  No  . Sexual activity: Yes    Birth control/ protection: None   Other Topics Concern  . 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   Review of Systems - See HPI.  All other ROS are negative.  BP 110/80   Pulse 69   Temp 98.1 F (36.7 C) (Oral)   Resp 16   Ht 5\' 2"  (1.575 m)   Wt 138 lb 8 oz (62.8 kg)   SpO2 97%   BMI 25.33 kg/m   Physical Exam  Constitutional: She is oriented to person, place, and time and well-developed, well-nourished, and in no distress.  HENT:  Head: Normocephalic and atraumatic.  Eyes: Pupils are equal, round, and reactive to light. Conjunctivae and EOM are normal. Lids are everted and swept, no foreign bodies found. Right conjunctiva is not injected. Right conjunctiva has no hemorrhage. Left conjunctiva is not injected. Left conjunctiva has no hemorrhage.  Neck: Neck supple.  Cardiovascular: Normal rate, regular rhythm, normal heart sounds and intact distal pulses.   Pulmonary/Chest: Effort normal and breath sounds normal. No respiratory distress. She has no wheezes. She has no rales. She exhibits no tenderness.  Neurological: She is alert and oriented to person, place, and time.  Skin: Skin is warm and dry.     Psychiatric: Affect normal.  Vitals reviewed.  Assessment/Plan: 1. Periorbital swelling Secondary  to immune response to insect bite. No active cellulitis noted. PERRLA, ROM intact. Will start prednisone taper giving previously as this has worked very well for her. Specialty appointment pending due to ongoing issues. No indication for antibiotic at present. Strict return precautions and ER precautions reviewed with patient.    Piedad ClimesMartin, Lajoyce Tamura Cody, PA-C

## 2017-01-31 NOTE — Patient Instructions (Signed)
Please start the prednisone as directed for swelling/immune reaction.  Keep skin clean and dry. Cold compresses for swelling.   If you note that symptoms are not quickly improving over the next few days, please call or come see us. At that time we would consider adding on an antibiotic.

## 2017-01-31 NOTE — Progress Notes (Signed)
Pre visit review using our clinic review tool, if applicable. No additional management support is needed unless otherwise documented below in the visit note. 

## 2017-04-06 ENCOUNTER — Ambulatory Visit: Payer: BLUE CROSS/BLUE SHIELD | Admitting: Allergy

## 2017-05-19 ENCOUNTER — Ambulatory Visit (INDEPENDENT_AMBULATORY_CARE_PROVIDER_SITE_OTHER): Payer: BLUE CROSS/BLUE SHIELD | Admitting: Allergy

## 2017-05-19 ENCOUNTER — Encounter: Payer: Self-pay | Admitting: Allergy

## 2017-05-19 VITALS — BP 110/72 | HR 71 | Resp 17 | Ht 63.0 in | Wt 144.0 lb

## 2017-05-19 DIAGNOSIS — H1010 Acute atopic conjunctivitis, unspecified eye: Secondary | ICD-10-CM | POA: Diagnosis not present

## 2017-05-19 DIAGNOSIS — J309 Allergic rhinitis, unspecified: Secondary | ICD-10-CM | POA: Diagnosis not present

## 2017-05-19 DIAGNOSIS — B999 Unspecified infectious disease: Secondary | ICD-10-CM

## 2017-05-19 DIAGNOSIS — L500 Allergic urticaria: Secondary | ICD-10-CM | POA: Diagnosis not present

## 2017-05-19 NOTE — Progress Notes (Signed)
Follow-up Note  RE: Sarah Stokes MRN: [id number] DOB: 12/11/1985 Date of Office Visit: 05/19/2017   History of present illness: Sarah Stokes is a 31 y.o. female presenting today for follow-up of allergic rhinoconjunctivitis and also has new complaint of rash.  She was last seen in the office on 01/28/16 by Dr. Willa RoughHicks.    At her last visit she had skin testing done with Dr. Willa RoughHicks that showed sensitivities to grasses, trees and dust mites.  She reports that during pollen season (spring-summer) she does have symptoms of runny nose, itchy watery eyes and sneezing and usually takes Xyzal, nasal saline rinses and OTC allergy relief eye drop.  She states she also gets a red, warm itchy palpable rash during the spring and summer months that comes and goes.  She states the rash may come and go over 3-7 days at a time. She reports she will use topical and oral benadryl which doesn't help that much.  She has also gotten oral steroid from her PCP to help with this rash which does help.    She also states she was re-referred back to see us as she reports her other providers were concerned the recurrent peri-orbital cellulitis could be caused by a food allergy.  She reports since her last visit with us she had another episode or peri-orbital cellulitis about 2 weeks after her visit with Dr. Willa RoughHicks.  She had another episode of peri-orbital cellulitis this summer.  Each episode she report she is getting IV antibiotics and steroids for treatment.  She has seen an ophthalmologist however she reports no one knows why she keeps getting these infections.  She also reports getting 1-2 ear infections a year even as a adult.  She denies any PNA, abscess or I&Ds and no opportunistic infections.    Review of systems: Review of Systems  Constitutional: Negative for chills, fever and malaise/fatigue.  HENT: Negative for congestion, ear discharge, ear pain, nosebleeds, sinus pain, sore throat and tinnitus.   Eyes:  Negative for pain, discharge and redness.  Respiratory: Negative for cough, shortness of breath and wheezing.   Cardiovascular: Negative for chest pain.  Gastrointestinal: Negative for abdominal pain, constipation, diarrhea, nausea and vomiting.  Musculoskeletal: Negative for joint pain.  Skin: Negative for itching and rash.  Neurological: Negative for headaches.    All other systems negative unless noted above in HPI  Past medical/social/surgical/family history have been reviewed and are unchanged unless specifically indicated below.  No changes  Medication List: Allergies as of 05/19/2017      Reactions   Ambien [zolpidem Tartrate]    Hallucinations   Clindamycin    Other reaction(s): GI Intolerance   Percocet [oxycodone-acetaminophen] Hives, Nausea And Vomiting   Vicodin [hydrocodone-acetaminophen] Hives, Nausea And Vomiting   Zolpidem    Hallucinations   Adhesive [tape] Rash   Tape from epidural       Medication List    as of 05/19/2017  3:59 PM   You have not been prescribed any medications.     Known medication allergies: Allergies  Allergen Reactions  . Ambien [Zolpidem Tartrate]     Hallucinations  . Clindamycin     Other reaction(s): GI Intolerance  . Percocet [Oxycodone-Acetaminophen] Hives and Nausea And Vomiting  . Vicodin [Hydrocodone-Acetaminophen] Hives and Nausea And Vomiting  . Zolpidem     Hallucinations  . Adhesive [Tape] Rash    Tape from epidural      Physical examination: Blood pressure 110/72, pulse 71, resp. rate  17, height 5\' 3"  (1.6 m), weight 144 lb (65.3 kg), SpO2 96 %, not currently breastfeeding.  General: Alert, interactive, in no acute distress. HEENT: PERRLA, TMs pearly gray, turbinates minimally edematous without discharge, post-pharynx non erythematous. Neck: Supple without lymphadenopathy. Lungs: Clear to auscultation without wheezing, rhonchi or rales. {no increased work of breathing. CV: Normal S1, S2 without  murmurs. Abdomen: Nondistended, nontender. Skin: Warm and dry, without lesions or rashes. Extremities:  No clubbing, cyanosis or edema. Neuro:   Grossly intact.  Diagnositics/Labs: Labs: none recently in past year  Assessment and plan:   Recurrent infections    - she has had several epsidoes peri-orbital cellulitis, sinusitis and ear infections in adulthood.  Discussed with patient that not concerned with food allergy causing infection as this is not characteristic symptoms of food allergy.  I am however more concerned about the functioning of her immune system.      - will perform a immunocompetence work-up with screening labs: cbc w diff, cmp, immunoglobulins, vaccine titers, CH50.   If these labs are abnormal, will need to perform further detailed evaluation.       - we will call you when these labs return  Allergic urticaria    - red, itchy red rash that you have mostly during pollen season appears most consistent with urticaria/hives.        - you have sensitivities to tree and grass pollen and should perform avoidance measures    - recommend use of Xyzal 5mg  daily at first onset of rash (may take extra dose if not controlled with 1 dose)     Allergic rhinoconjunctivitis   - as above take Xyzal during pollen season    - continue nasal saline rinses   - use OTC allergy relief eyedrop like Alaway or Zaditor  Follow-up 6-9 months or sooner if needed   I appreciate the opportunity to take part in [name]'s care. Please do not hesitate to contact me with questions.  Sincerely,   Margo AyeShaylar Padgett, MD Allergy/Immunology [facility]

## 2017-05-19 NOTE — Patient Instructions (Addendum)
Recurrent infections    - will perform a immunocompetence work-up as you have had recurrent cellulitis and ear infections. The following labs will be obtained as a screen: cbc w diff, cmp, immunoglobulins, vaccine titers, CH50.   If these labs are abnormal, will need to perform further detailed evaluation.       - we will call you when these labs return  Allergic urticaria    - red, itchy red rash that you have mostly during pollen season appears most consistent with urticaria/hives.        - you have sensitivities to tree and grass pollen and should perform avoidance measures    - recommend use of Xyzal 5mg  daily at first onset of rash (may take extra dose if not controlled with 1 dose)     Allergic rhinoconjunctivitis   - as above take Xyzal during pollen season    - continue nasal saline rinses   - use OTC allergy relief eyedrop like Alaway or Zaditor  Follow-up 6-9 months or sooner if needed

## 2017-05-29 LAB — HM PAP SMEAR

## 2017-06-01 LAB — CBC WITH DIFFERENTIAL/PLATELET
BASOS ABS: 0 10*3/uL (ref 0.0–0.2)
Basos: 0 %
EOS (ABSOLUTE): 0.1 10*3/uL (ref 0.0–0.4)
Eos: 1 %
HEMOGLOBIN: 12.5 g/dL (ref 11.1–15.9)
Hematocrit: 38.2 % (ref 34.0–46.6)
Immature Grans (Abs): 0 10*3/uL (ref 0.0–0.1)
Immature Granulocytes: 0 %
LYMPHS ABS: 3.5 10*3/uL — AB (ref 0.7–3.1)
Lymphs: 37 %
MCH: 28.6 pg (ref 26.6–33.0)
MCHC: 32.7 g/dL (ref 31.5–35.7)
MCV: 87 fL (ref 79–97)
MONOCYTES: 9 %
Monocytes Absolute: 0.8 10*3/uL (ref 0.1–0.9)
NEUTROS ABS: 4.9 10*3/uL (ref 1.4–7.0)
Neutrophils: 53 %
Platelets: 300 10*3/uL (ref 150–379)
RBC: 4.37 x10E6/uL (ref 3.77–5.28)
RDW: 12.5 % (ref 12.3–15.4)
WBC: 9.3 10*3/uL (ref 3.4–10.8)

## 2017-06-01 LAB — DIPHTHERIA / TETANUS ANTIBODY PANEL: Tetanus Ab, IgG: 2.34 IU/mL (ref <0.10)

## 2017-06-01 LAB — STREP PNEUMONIAE 23 SEROTYPES IGG
PNEUMO AB TYPE 12 (12F): 0.2 ug/mL — AB (ref >1.3)
PNEUMO AB TYPE 14: 13.3 ug/mL (ref >1.3)
PNEUMO AB TYPE 17 (17F): 4.9 ug/mL (ref >1.3)
PNEUMO AB TYPE 20: 5 ug/mL (ref >1.3)
PNEUMO AB TYPE 22 (22F): 0.5 ug/mL — AB (ref >1.3)
PNEUMO AB TYPE 2: 5.1 ug/mL (ref >1.3)
PNEUMO AB TYPE 3: 3.2 ug/mL (ref >1.3)
PNEUMO AB TYPE 57 (19A): 7.6 ug/mL (ref >1.3)
Pneumo Ab Type 1*: 1.3 ug/mL — ABNORMAL LOW (ref >1.3)
Pneumo Ab Type 19 (19F)*: 2.7 ug/mL (ref >1.3)
Pneumo Ab Type 23 (23F)*: 0.4 ug/mL — ABNORMAL LOW (ref >1.3)
Pneumo Ab Type 26 (6B)*: 1.6 ug/mL (ref >1.3)
Pneumo Ab Type 34 (10A)*: 0.8 ug/mL — ABNORMAL LOW (ref >1.3)
Pneumo Ab Type 4*: 0.3 ug/mL — ABNORMAL LOW (ref >1.3)
Pneumo Ab Type 43 (11A)*: 0.5 ug/mL — ABNORMAL LOW (ref >1.3)
Pneumo Ab Type 5*: 0.3 ug/mL — ABNORMAL LOW (ref >1.3)
Pneumo Ab Type 51 (7F)*: >32.3 ug/mL (ref >1.3)
Pneumo Ab Type 56 (18C)*: 0.5 ug/mL — ABNORMAL LOW (ref >1.3)
Pneumo Ab Type 68 (9V)*: 1.5 ug/mL (ref >1.3)
Pneumo Ab Type 70 (33F)*: 1 ug/mL — ABNORMAL LOW (ref >1.3)
Pneumo Ab Type 8*: 0.8 ug/mL — ABNORMAL LOW (ref >1.3)
Pneumo Ab Type 9 (9N)*: 0.7 ug/mL — ABNORMAL LOW (ref >1.3)

## 2017-06-01 LAB — COMPLEMENT, TOTAL

## 2017-06-01 LAB — IGG, IGA, IGM
IGG (IMMUNOGLOBIN G), SERUM: 1136 mg/dL (ref 700–1600)
IgA/Immunoglobulin A, Serum: 218 mg/dL (ref 87–352)
IgM (Immunoglobulin M), Srm: 137 mg/dL (ref 26–217)

## 2017-06-13 ENCOUNTER — Telehealth: Payer: Self-pay | Admitting: Allergy

## 2017-06-13 NOTE — Telephone Encounter (Signed)
Patient is returning a call from [place].

## 2017-06-13 NOTE — Telephone Encounter (Signed)
Pt advised of instructions

## 2017-07-07 LAB — OB RESULTS CONSOLE HIV ANTIBODY (ROUTINE TESTING): HIV: NONREACTIVE

## 2017-07-07 LAB — OB RESULTS CONSOLE RUBELLA ANTIBODY, IGM: RUBELLA: IMMUNE

## 2017-07-07 LAB — OB RESULTS CONSOLE ABO/RH: RH Type: POSITIVE

## 2017-07-07 LAB — OB RESULTS CONSOLE RPR: RPR: NONREACTIVE

## 2017-07-07 LAB — OB RESULTS CONSOLE GC/CHLAMYDIA
CHLAMYDIA, DNA PROBE: NEGATIVE
Gonorrhea: NEGATIVE

## 2017-07-07 LAB — OB RESULTS CONSOLE HEPATITIS B SURFACE ANTIGEN: HEP B S AG: NEGATIVE

## 2017-07-07 LAB — OB RESULTS CONSOLE ANTIBODY SCREEN: Antibody Screen: NEGATIVE

## 2017-08-14 ENCOUNTER — Other Ambulatory Visit (HOSPITAL_COMMUNITY): Payer: Self-pay | Admitting: Obstetrics and Gynecology

## 2017-08-14 DIAGNOSIS — R1011 Right upper quadrant pain: Secondary | ICD-10-CM

## 2017-08-16 ENCOUNTER — Ambulatory Visit (HOSPITAL_COMMUNITY)
Admission: RE | Admit: 2017-08-16 | Discharge: 2017-08-16 | Disposition: A | Discharge status: Home / Self Care | Payer: BLUE CROSS/BLUE SHIELD | Source: Ambulatory Visit | Attending: Obstetrics and Gynecology | Admitting: Obstetrics and Gynecology

## 2017-08-16 DIAGNOSIS — R1011 Right upper quadrant pain: Secondary | ICD-10-CM | POA: Insufficient documentation

## 2017-08-29 ENCOUNTER — Telehealth: Payer: BLUE CROSS/BLUE SHIELD | Admitting: Family

## 2017-08-29 DIAGNOSIS — B9689 Other specified bacterial agents as the cause of diseases classified elsewhere: Secondary | ICD-10-CM

## 2017-08-29 DIAGNOSIS — J019 Acute sinusitis, unspecified: Secondary | ICD-10-CM

## 2017-08-29 MED ORDER — AMOXICILLIN-POT CLAVULANATE 875-125 MG PO TABS: 1.0000 | ORAL_TABLET | Freq: Two times a day (BID) | ORAL | 0 refills | Status: DC

## 2017-08-29 NOTE — Progress Notes (Signed)

## 2017-11-02 ENCOUNTER — Encounter (HOSPITAL_COMMUNITY): Payer: Self-pay | Admitting: *Deleted

## 2017-11-02 ENCOUNTER — Inpatient Hospital Stay (HOSPITAL_COMMUNITY)
Admission: AD | Admit: 2017-11-02 | Discharge: 2017-11-02 | Disposition: A | Discharge status: Home / Self Care | Payer: BLUE CROSS/BLUE SHIELD | Source: Ambulatory Visit | Attending: Obstetrics and Gynecology | Admitting: Obstetrics and Gynecology

## 2017-11-02 DIAGNOSIS — O26893 Other specified pregnancy related conditions, third trimester: Secondary | ICD-10-CM | POA: Diagnosis present

## 2017-11-02 DIAGNOSIS — Z3A28 28 weeks gestation of pregnancy: Secondary | ICD-10-CM | POA: Insufficient documentation

## 2017-11-02 DIAGNOSIS — O9989 Other specified diseases and conditions complicating pregnancy, childbirth and the puerperium: Secondary | ICD-10-CM | POA: Diagnosis not present

## 2017-11-02 DIAGNOSIS — A0471 Enterocolitis due to Clostridium difficile, recurrent: Secondary | ICD-10-CM | POA: Diagnosis not present

## 2017-11-02 DIAGNOSIS — O98813 Other maternal infectious and parasitic diseases complicating pregnancy, third trimester: Secondary | ICD-10-CM | POA: Insufficient documentation

## 2017-11-02 DIAGNOSIS — R197 Diarrhea, unspecified: Secondary | ICD-10-CM | POA: Diagnosis present

## 2017-11-02 LAB — COMPREHENSIVE METABOLIC PANEL
ALK PHOS: 82 U/L (ref 38–126)
ALT: 12 U/L — AB (ref 14–54)
AST: 16 U/L (ref 15–41)
Albumin: 2.6 g/dL — ABNORMAL LOW (ref 3.5–5.0)
Anion gap: 9 (ref 5–15)
BUN: 9 mg/dL (ref 6–20)
CALCIUM: 8.5 mg/dL — AB (ref 8.9–10.3)
CO2: 19 mmol/L — AB (ref 22–32)
CREATININE: 0.52 mg/dL (ref 0.44–1.00)
Chloride: 107 mmol/L (ref 101–111)
GFR calc Af Amer: >60 mL/min (ref >60)
Glucose, Bld: 82 mg/dL (ref 65–99)
Potassium: 3.5 mmol/L (ref 3.5–5.1)
Sodium: 135 mmol/L (ref 135–145)
Total Bilirubin: 0.2 mg/dL — ABNORMAL LOW (ref 0.3–1.2)
Total Protein: 5.9 g/dL — ABNORMAL LOW (ref 6.5–8.1)

## 2017-11-02 LAB — CBC WITH DIFFERENTIAL/PLATELET
BASOS ABS: 0 10*3/uL (ref 0.0–0.1)
Basophils Relative: 0 %
EOS PCT: 2 %
Eosinophils Absolute: 0.2 10*3/uL (ref 0.0–0.7)
HCT: 33.8 % — ABNORMAL LOW (ref 36.0–46.0)
HEMOGLOBIN: 11.3 g/dL — AB (ref 12.0–15.0)
LYMPHS ABS: 2.1 10*3/uL (ref 0.7–4.0)
LYMPHS PCT: 15 %
MCH: 30.5 pg (ref 26.0–34.0)
MCHC: 33.4 g/dL (ref 30.0–36.0)
MCV: 91.4 fL (ref 78.0–100.0)
Monocytes Absolute: 0.8 10*3/uL (ref 0.1–1.0)
Monocytes Relative: 6 %
NEUTROS ABS: 10.4 10*3/uL — AB (ref 1.7–7.7)
NEUTROS PCT: 77 %
PLATELETS: 230 10*3/uL (ref 150–400)
RBC: 3.7 MIL/uL — AB (ref 3.87–5.11)
RDW: 12.9 % (ref 11.5–15.5)
WBC: 13.6 10*3/uL — AB (ref 4.0–10.5)

## 2017-11-02 LAB — URINALYSIS, ROUTINE W REFLEX MICROSCOPIC
Bilirubin Urine: NEGATIVE
Glucose, UA: NEGATIVE mg/dL
Hgb urine dipstick: NEGATIVE
Ketones, ur: NEGATIVE mg/dL
Nitrite: NEGATIVE
PROTEIN: NEGATIVE mg/dL
SPECIFIC GRAVITY, URINE: 1.017 (ref 1.005–1.030)
pH: 7 (ref 5.0–8.0)

## 2017-11-02 MED ORDER — LACTATED RINGERS IV BOLUS
1000.0000 mL | Freq: Once | INTRAVENOUS | Status: AC
  Administered 2017-11-02: 1000 mL via INTRAVENOUS

## 2017-11-02 NOTE — MAU Provider Note (Signed)
Chief Complaint:  Diarrhea   First Provider Initiated Contact with Patient 11/02/17 1449      HPI: Sarah Stokes is a 32 y.o. G3P2002 at [redacted]w[redacted]d who presents to maternity admissions reporting onset of watery yellow malodorous stools last night with 22 stools in 24 hours.  She was treated for C-Diff with vancomycin 1 month ago and completed her course.  Her symptoms completely resolved with treatment until new onset last night.  She is drinking sports drinks and water and denies any n/v.  She reports h/a, but no other associated symptoms.   She has not tried any other treatments. She saw Dr Loreta Ave, GI, this morning for her symptoms and reports that the physician is going to call her back with a treatment plan. She went to her OB office and was sent to MAU for OB evaluation and IV fluids. She reports good fetal movement, denies regular cramping/contractions, LOF, vaginal bleeding, vaginal itching/burning, urinary symptoms, h/a, dizziness, n/v, or fever/chills.    HPI  Past Medical History: Past Medical History:  Diagnosis Date  . Anxiety   . Hx of varicella   . Migraine 2006  . Periorbital cellulitis   . Postpartum care following cesarean delivery (10/6) 04/16/2013  . Status post primary low transverse cesarean section 04/15/2013    Past obstetric history: OB History  Gravida Para Term Preterm AB Living  3 2 2     2   SAB TAB Ectopic Multiple Live Births        0 2    # Outcome Date GA Lbr Len/2nd Weight Sex Delivery Anes PTL Lv  3 Current           2 Term 08/24/15 [redacted]w[redacted]d  7 lb 9.5 oz (3.445 kg) M CS-Vac Spinal  LIV  1 Term 04/15/13 [redacted]w[redacted]d  6 lb 14.2 oz (3.125 kg) M CS-LTranv EPI  LIV    Past Surgical History: Past Surgical History:  Procedure Laterality Date  . CESAREAN SECTION N/A 04/15/2013   Procedure: CESAREAN SECTION;  Surgeon: Robley Fries, MD;  Location: [facility];  Service: Obstetrics;  Laterality: N/A;  . CESAREAN SECTION N/A 08/24/2015   Procedure: CESAREAN SECTION;  Surgeon:  Marcelle Overlie, MD;  Location: [facility];  Service: Obstetrics;  Laterality: N/A;  REPEAT EDC 08/28/15   . HIP SURGERY     took bone for knee  . KNEE SURGERY     left x6, Dr. Eulah Pont  . labial repair  2006  . TONSILLECTOMY      Family History: Family History  Problem Relation Age of Onset  . Thyroid disease Mother   . Cancer Mother        melanoma  . Allergic rhinitis Mother   . Diabetes Maternal Aunt   . Diabetes Maternal Uncle   . Thyroid disease Paternal Grandmother   . Allergic rhinitis Father   . Allergic rhinitis Sister   . Allergic rhinitis Brother     Social History: Social History   Tobacco Use  . Smoking status: Never Smoker  . Smokeless tobacco: Never Used  Substance Use Topics  . Alcohol use: No  . Drug use: No    Allergies:  Allergies  Allergen Reactions  . Ambien [Zolpidem Tartrate]     Hallucinations  . Clindamycin     Other reaction(s): GI Intolerance  . Percocet [Oxycodone-Acetaminophen] Hives and Nausea And Vomiting  . Vicodin [Hydrocodone-Acetaminophen] Hives and Nausea And Vomiting  . Zolpidem     Hallucinations  . Adhesive [Tape] Rash  Tape from epidural     Meds:  Medications Prior to Admission  Medication Sig Dispense Refill Last Dose  . acidophilus (RISAQUAD) CAPS capsule Take 1 capsule by mouth daily.   11/01/2017 at Unknown time  . loratadine (CLARITIN) 10 MG tablet Take 10 mg by mouth daily as needed for allergies.   11/01/2017 at Unknown time  . Prenatal Vit-Fe Fumarate-FA (PRENATAL MULTIVITAMIN) TABS tablet Take 1 tablet by mouth daily at 12 noon.   11/01/2017 at Unknown time  . amoxicillin-clavulanate (AUGMENTIN) 875-125 MG tablet Take 1 tablet by mouth 2 (two) times daily. 14 tablet 0     ROS:  Review of Systems  Constitutional: Negative for chills, fatigue and fever.  Eyes: Negative for visual disturbance.  Respiratory: Negative for shortness of breath.   Cardiovascular: Negative for chest pain.  Gastrointestinal: Positive  for diarrhea. Negative for abdominal pain, constipation, nausea and vomiting.  Genitourinary: Negative for difficulty urinating, dysuria, flank pain, pelvic pain, vaginal bleeding, vaginal discharge and vaginal pain.  Musculoskeletal: Negative for back pain.  Neurological: Negative for dizziness and headaches.  Psychiatric/Behavioral: Negative.      I have reviewed patient's Past Medical Hx, Surgical Hx, Family Hx, Social Hx, medications and allergies.   Physical Exam   Patient Vitals for the past 24 hrs:  BP Temp Temp src Pulse Resp Height Weight  11/02/17 1305 112/69 98.6 F (37 C) Oral 78 18 5\' 2"  (1.575 m) 152 lb (68.9 kg)   Constitutional: Well-developed, well-nourished female in no acute distress.  Cardiovascular: normal rate Respiratory: normal effort GI: Abd soft, non-tender, gravid appropriate for gestational age.  MS: Extremities nontender, no edema, normal ROM Neurologic: Alert and oriented x 4.  GU: Neg CVAT.  PELVIC EXAM: Deferred     FHT:  Baseline 140 , moderate variability, accelerations present, no decelerations Contractions None on toco or to palpation   Labs: Results for orders placed or performed during the hospital encounter of 11/02/17 (from the past 24 hour(s))  Urinalysis, Routine w reflex microscopic     Status: Abnormal   Collection Time: 11/02/17  1:03 PM  Result Value Ref Range   Color, Urine YELLOW YELLOW   APPearance CLOUDY (A) CLEAR   Specific Gravity, Urine 1.017 1.005 - 1.030   pH 7.0 5.0 - 8.0   Glucose, UA NEGATIVE NEGATIVE mg/dL   Hgb urine dipstick NEGATIVE NEGATIVE   Bilirubin Urine NEGATIVE NEGATIVE   Ketones, ur NEGATIVE NEGATIVE mg/dL   Protein, ur NEGATIVE NEGATIVE mg/dL   Nitrite NEGATIVE NEGATIVE   Leukocytes, UA TRACE (A) NEGATIVE   RBC / HPF 0-5 0 - 5 RBC/hpf   WBC, UA 0-5 0 - 5 WBC/hpf   Bacteria, UA RARE (A) NONE SEEN   Squamous Epithelial / LPF 21-50 0 - 5   Mucus PRESENT   CBC with Differential/Platelet     Status:  Abnormal   Collection Time: 11/02/17  2:35 PM  Result Value Ref Range   WBC 13.6 (H) 4.0 - 10.5 K/uL   RBC 3.70 (L) 3.87 - 5.11 MIL/uL   Hemoglobin 11.3 (L) 12.0 - 15.0 g/dL   HCT 69.633.8 (L) 29.536.0 - 28.446.0 %   MCV 91.4 78.0 - 100.0 fL   MCH 30.5 26.0 - 34.0 pg   MCHC 33.4 30.0 - 36.0 g/dL   RDW 13.212.9 44.011.5 - 10.215.5 %   Platelets 230 150 - 400 K/uL   Neutrophils Relative % 77 %   Neutro Abs 10.4 (H) 1.7 - 7.7 K/uL  Lymphocytes Relative 15 %   Lymphs Abs 2.1 0.7 - 4.0 K/uL   Monocytes Relative 6 %   Monocytes Absolute 0.8 0.1 - 1.0 K/uL   Eosinophils Relative 2 %   Eosinophils Absolute 0.2 0.0 - 0.7 K/uL   Basophils Relative 0 %   Basophils Absolute 0.0 0.0 - 0.1 K/uL  Comprehensive metabolic panel     Status: Abnormal   Collection Time: 11/02/17  2:35 PM  Result Value Ref Range   Sodium 135 135 - 145 mmol/L   Potassium 3.5 3.5 - 5.1 mmol/L   Chloride 107 101 - 111 mmol/L   CO2 19 (L) 22 - 32 mmol/L   Glucose, Bld 82 65 - 99 mg/dL   BUN 9 6 - 20 mg/dL   Creatinine, Ser 2.13 0.44 - 1.00 mg/dL   Calcium 8.5 (L) 8.9 - 10.3 mg/dL   Total Protein 5.9 (L) 6.5 - 8.1 g/dL   Albumin 2.6 (L) 3.5 - 5.0 g/dL   AST 16 15 - 41 U/L   ALT 12 (L) 14 - 54 U/L   Alkaline Phosphatase 82 38 - 126 U/L   Total Bilirubin 0.2 (L) 0.3 - 1.2 mg/dL   GFR calc non Af Amer >60 >60 mL/min   GFR calc Af Amer >60 >60 mL/min   Anion gap 9 5 - 15      Imaging:  No results found.  MAU Course/MDM: I have ordered labs and reviewed results.  NST reviewed and reactive CBC with WBCs 13.6, wnl for pregnancy No hypokalemia or other electrolyte imbalances UA wnl, no evidence of dehydration IV fluids given, pt reports complete resolution of h/a Pt stable, no OB/Gyn concerns, needs further management of recurrent C-diff from GI GI to call pt back after her appointment today with management plan Called Dr Loreta Ave, pt GI doctor and left message to confirm plan to change pt treatment regimen Consult Dr Vincente Poli with  presentation, exam findings and test results.  Pt to f/u with office tomorrow morning if Dr Loreta Ave does not call her today Pt discharge with strict return precautions.   Assessment: 1. Enterocolitis due to Clostridium difficile, recurrent   2. Pregnancy with 28 completed weeks gestation     Plan: Discharge home Labor precautions and fetal kick counts Follow-up Information    Charna Elizabeth, MD Follow up.   Specialty:  Gastroenterology Why:  GI office to call you with treatment plan or call the office first thing tomorrow morning. Return to MAU as needed for emergencies. Contact information: [address] [place] [address] [phone]          Allergies as of 11/02/2017      Reactions   Ambien [zolpidem Tartrate]    Hallucinations   Clindamycin    Other reaction(s): GI Intolerance   Percocet [oxycodone-acetaminophen] Hives, Nausea And Vomiting   Vicodin [hydrocodone-acetaminophen] Hives, Nausea And Vomiting   Zolpidem    Hallucinations   Adhesive [tape] Rash   Tape from epidural       Medication List    STOP taking these medications   amoxicillin-clavulanate 875-125 MG tablet Commonly known as:  AUGMENTIN     TAKE these medications   acidophilus Caps capsule Take 1 capsule by mouth daily.   loratadine 10 MG tablet Commonly known as:  CLARITIN Take 10 mg by mouth daily as needed for allergies.   prenatal multivitamin Tabs tablet Take 1 tablet by mouth daily at 12 noon.  Sharen Counter Certified Nurse-Midwife 11/02/2017 5:03 PM

## 2017-11-02 NOTE — MAU Note (Signed)
Pt was diagnosed with c-diff last month. Finished antibiotics and was doping better started having runny diarrhea  last night (22 times ). Sent her for IV fluids

## 2018-01-01 ENCOUNTER — Encounter (HOSPITAL_COMMUNITY): Payer: Self-pay

## 2018-01-10 NOTE — H&P (Signed)
Sarah RowanConstance Stokes is a 32 y.o. G 3 P 2 at 39 weeks presents for repeat C Section. Pregnancy complicated by 2 serious episodes of C Diff - required Vancomycin both times.  OB History    Gravida  3   Para  2   Term  2   Preterm      AB      Living  2     SAB      TAB      Ectopic      Multiple  0   Live Births  2          Past Medical History:  Diagnosis Date  . Anxiety   . History of Raynaud's syndrome   . Hx of varicella   . Migraine 2006  . Periorbital cellulitis   . Postpartum care following cesarean delivery (10/6) 04/16/2013  . Status post primary low transverse cesarean section 04/15/2013   Past Surgical History:  Procedure Laterality Date  . CESAREAN SECTION N/A 04/15/2013   Procedure: CESAREAN SECTION;  Surgeon: Robley FriesVaishali R Mody, MD;  Location: [facility];  Service: Obstetrics;  Laterality: N/A;  . CESAREAN SECTION N/A 08/24/2015   Procedure: CESAREAN SECTION;  Surgeon: Marcelle OverlieMichelle Develle Sievers, MD;  Location: [facility];  Service: Obstetrics;  Laterality: N/A;  REPEAT EDC 08/28/15   . HIP SURGERY     took bone for knee  . KNEE SURGERY     left x6, Dr. Eulah PontMurphy  . labial repair  2006  . TONSILLECTOMY     Family History: family history includes Allergic rhinitis in her brother, father, mother, and sister; Cancer in her mother; Diabetes in her maternal aunt and maternal uncle; Thyroid disease in her mother and paternal grandmother. Social History:  reports that she has never smoked. She has never used smokeless tobacco. She reports that she does not drink alcohol or use drugs.     Maternal Diabetes: No Genetic Screening: Normal Maternal Ultrasounds/Referrals: Normal Fetal Ultrasounds or other Referrals:  None Maternal Substance Abuse:  No Significant Maternal Medications:  None Significant Maternal Lab Results:  None Other Comments:  None  ROS History   There were no vitals taken for this visit. Exam Physical Exam  Prenatal labs: ABO, Rh: O/Positive/-- (12/28  0000) Antibody: Negative (12/28 0000) Rubella: Immune (12/28 0000) RPR: Nonreactive (12/28 0000)  HBsAg: Negative (12/28 0000)  HIV: Non-reactive (12/28 0000)  GBS:     General alert and oriented Lung CTAB Car RRR Abdomen is soft and non tender  Assessment/Plan: IUP at 39 weeks Previous C Section Repeat LTCS Consent signed   Deep Bonawitz L 01/10/2018, 10:02 AM

## 2018-01-15 ENCOUNTER — Encounter (HOSPITAL_COMMUNITY)
Admission: RE | Admit: 2018-01-15 | Discharge: 2018-01-15 | Disposition: A | Discharge status: Home / Self Care | Payer: BLUE CROSS/BLUE SHIELD | Source: Ambulatory Visit | Attending: Obstetrics and Gynecology | Admitting: Obstetrics and Gynecology

## 2018-01-15 HISTORY — DX: Personal history of other diseases of the circulatory system: Z86.79

## 2018-01-15 LAB — CBC
HCT: 38.2 % (ref 36.0–46.0)
HEMOGLOBIN: 12.4 g/dL (ref 12.0–15.0)
MCH: 29.1 pg (ref 26.0–34.0)
MCHC: 32.5 g/dL (ref 30.0–36.0)
MCV: 89.7 fL (ref 78.0–100.0)
PLATELETS: 192 10*3/uL (ref 150–400)
RBC: 4.26 MIL/uL (ref 3.87–5.11)
RDW: 13.6 % (ref 11.5–15.5)
WBC: 9.9 10*3/uL (ref 4.0–10.5)

## 2018-01-15 LAB — TYPE AND SCREEN
ABO/RH(D): O POS
Antibody Screen: NEGATIVE

## 2018-01-15 NOTE — Patient Instructions (Signed)
Sarah RowanConstance Stokes  01/15/2018   Your procedure is scheduled on:  01/16/2018  [facility] at 0530 AM.  Pick up the phone at the desk and dial [phone]  Call this number if you have problems the morning of surgery:[phone]  Remember:   Do not eat food:(After Midnight) Desps de medianoche.  Do not drink clear liquids: (After Midnight) Desps de medianoche.  Take these medicines the morning of surgery with A SIP OF WATER: none   Do not wear jewelry, make-up or nail polish.  Do not wear lotions, powders, or perfumes. Do not wear deodorant.  Do not shave 48 hours prior to surgery.  Do not bring valuables to the hospital.  [facility] is not   responsible for any belongings or valuables brought to the hospital.  Contacts, dentures or bridgework may not be worn into surgery.  Leave suitcase in the car. After surgery it may be brought to your room.  For patients admitted to the hospital, checkout time is 11:00 AM the day of              discharge.    N/A   Please read over the following fact sheets that you were given:   Surgical Site Infection Prevention

## 2018-01-16 ENCOUNTER — Inpatient Hospital Stay (HOSPITAL_COMMUNITY)
Admission: AD | Admit: 2018-01-16 | Discharge: 2018-01-17 | DRG: 788 | Disposition: A | Discharge status: Home / Self Care | Payer: BLUE CROSS/BLUE SHIELD | Attending: Obstetrics and Gynecology | Admitting: Obstetrics and Gynecology

## 2018-01-16 ENCOUNTER — Inpatient Hospital Stay (HOSPITAL_COMMUNITY): Payer: BLUE CROSS/BLUE SHIELD | Admitting: Certified Registered Nurse Anesthetist

## 2018-01-16 ENCOUNTER — Encounter (HOSPITAL_COMMUNITY): Payer: Self-pay | Admitting: *Deleted

## 2018-01-16 ENCOUNTER — Encounter (HOSPITAL_COMMUNITY)
Admission: AD | Disposition: A | Discharge status: Home / Self Care | Payer: Self-pay | Source: Home / Self Care | Attending: Obstetrics and Gynecology

## 2018-01-16 DIAGNOSIS — Z98891 History of uterine scar from previous surgery: Secondary | ICD-10-CM

## 2018-01-16 DIAGNOSIS — Z3A39 39 weeks gestation of pregnancy: Secondary | ICD-10-CM | POA: Diagnosis not present

## 2018-01-16 DIAGNOSIS — E669 Obesity, unspecified: Secondary | ICD-10-CM | POA: Diagnosis present

## 2018-01-16 DIAGNOSIS — O34211 Maternal care for low transverse scar from previous cesarean delivery: Secondary | ICD-10-CM | POA: Diagnosis present

## 2018-01-16 DIAGNOSIS — O99214 Obesity complicating childbirth: Secondary | ICD-10-CM | POA: Diagnosis present

## 2018-01-16 LAB — RPR: RPR Ser Ql: NONREACTIVE

## 2018-01-16 SURGERY — Surgical Case
Anesthesia: Spinal

## 2018-01-16 MED ORDER — GLYCOPYRROLATE 0.2 MG/ML IJ SOLN
INTRAMUSCULAR | Status: DC | PRN
  Administered 2018-01-16: 0.2 mg via INTRAVENOUS

## 2018-01-16 MED ORDER — LACTATED RINGERS IV SOLN: INTRAVENOUS | Status: DC

## 2018-01-16 MED ORDER — MEPERIDINE HCL 25 MG/ML IJ SOLN: 6.2500 mg | INTRAMUSCULAR | Status: DC | PRN

## 2018-01-16 MED ORDER — FENTANYL CITRATE (PF) 100 MCG/2ML IJ SOLN
INTRAMUSCULAR | Status: DC | PRN
  Administered 2018-01-16: 10 ug via INTRATHECAL

## 2018-01-16 MED ORDER — FENTANYL CITRATE (PF) 100 MCG/2ML IJ SOLN
INTRAMUSCULAR | Status: AC
  Filled 2018-01-16: qty 2

## 2018-01-16 MED ORDER — MORPHINE SULFATE (PF) 0.5 MG/ML IJ SOLN
INTRAMUSCULAR | Status: DC | PRN
  Administered 2018-01-16: 200 ug via INTRATHECAL

## 2018-01-16 MED ORDER — OXYCODONE-ACETAMINOPHEN 5-325 MG PO TABS: 1.0000 | ORAL_TABLET | ORAL | Status: DC | PRN

## 2018-01-16 MED ORDER — ACETAMINOPHEN 325 MG PO TABS
650.0000 mg | ORAL_TABLET | ORAL | Status: DC | PRN
  Administered 2018-01-16: 650 mg via ORAL
  Filled 2018-01-16: qty 2

## 2018-01-16 MED ORDER — SENNOSIDES-DOCUSATE SODIUM 8.6-50 MG PO TABS
2.0000 | ORAL_TABLET | ORAL | Status: DC
  Administered 2018-01-17: 2 via ORAL
  Filled 2018-01-16 (×3): qty 2

## 2018-01-16 MED ORDER — TRAMADOL HCL 50 MG PO TABS
50.0000 mg | ORAL_TABLET | Freq: Four times a day (QID) | ORAL | Status: DC
  Administered 2018-01-17 (×3): 50 mg via ORAL
  Filled 2018-01-16 (×3): qty 1

## 2018-01-16 MED ORDER — OXYTOCIN 10 UNIT/ML IJ SOLN
INTRAVENOUS | Status: DC | PRN
  Administered 2018-01-16: 40 [IU] via INTRAVENOUS

## 2018-01-16 MED ORDER — ZOLPIDEM TARTRATE 5 MG PO TABS: 5.0000 mg | ORAL_TABLET | Freq: Every evening | ORAL | Status: DC | PRN

## 2018-01-16 MED ORDER — BUPIVACAINE IN DEXTROSE 0.75-8.25 % IT SOLN
INTRATHECAL | Status: DC | PRN
  Administered 2018-01-16: 1.6 mL via INTRATHECAL

## 2018-01-16 MED ORDER — OXYCODONE-ACETAMINOPHEN 5-325 MG PO TABS: 2.0000 | ORAL_TABLET | ORAL | Status: DC | PRN

## 2018-01-16 MED ORDER — NALBUPHINE HCL 10 MG/ML IJ SOLN: 5.0000 mg | Freq: Once | INTRAMUSCULAR | Status: AC | PRN

## 2018-01-16 MED ORDER — SIMETHICONE 80 MG PO CHEW
80.0000 mg | CHEWABLE_TABLET | Freq: Three times a day (TID) | ORAL | Status: DC
  Administered 2018-01-16 (×2): 80 mg via ORAL
  Filled 2018-01-16 (×8): qty 1

## 2018-01-16 MED ORDER — SIMETHICONE 80 MG PO CHEW
80.0000 mg | CHEWABLE_TABLET | ORAL | Status: DC | PRN
  Filled 2018-01-16: qty 1

## 2018-01-16 MED ORDER — VANCOMYCIN HCL IN DEXTROSE 1-5 GM/200ML-% IV SOLN
1000.0000 mg | Freq: Once | INTRAVENOUS | Status: DC
  Filled 2018-01-16: qty 200

## 2018-01-16 MED ORDER — SIMETHICONE 80 MG PO CHEW
80.0000 mg | CHEWABLE_TABLET | ORAL | Status: DC
  Administered 2018-01-17: 80 mg via ORAL
  Filled 2018-01-16 (×3): qty 1

## 2018-01-16 MED ORDER — FENTANYL CITRATE (PF) 100 MCG/2ML IJ SOLN: 25.0000 ug | INTRAMUSCULAR | Status: DC | PRN

## 2018-01-16 MED ORDER — SCOPOLAMINE 1 MG/3DAYS TD PT72
1.0000 | MEDICATED_PATCH | Freq: Once | TRANSDERMAL | Status: DC
  Administered 2018-01-16: 1.5 mg via TRANSDERMAL

## 2018-01-16 MED ORDER — SODIUM CHLORIDE 0.9% FLUSH
3.0000 mL | INTRAVENOUS | Status: DC | PRN
Start: 2018-01-16 — End: 2018-01-17

## 2018-01-16 MED ORDER — NALBUPHINE HCL 10 MG/ML IJ SOLN
5.0000 mg | INTRAMUSCULAR | Status: DC | PRN
  Filled 2018-01-16: qty 1

## 2018-01-16 MED ORDER — FAMOTIDINE 20 MG PO TABS
20.0000 mg | ORAL_TABLET | Freq: Once | ORAL | Status: AC
  Administered 2018-01-16: 20 mg via ORAL
  Filled 2018-01-16: qty 1

## 2018-01-16 MED ORDER — DIPHENHYDRAMINE HCL 25 MG PO CAPS
25.0000 mg | ORAL_CAPSULE | Freq: Four times a day (QID) | ORAL | Status: DC | PRN
  Filled 2018-01-16: qty 1

## 2018-01-16 MED ORDER — OXYTOCIN 40 UNITS IN LACTATED RINGERS INFUSION - SIMPLE MED: 2.5000 [IU]/h | INTRAVENOUS | Status: AC

## 2018-01-16 MED ORDER — IBUPROFEN 600 MG PO TABS
600.0000 mg | ORAL_TABLET | Freq: Four times a day (QID) | ORAL | Status: DC
  Administered 2018-01-16 – 2018-01-17 (×4): 600 mg via ORAL
  Filled 2018-01-16 (×4): qty 1

## 2018-01-16 MED ORDER — ONDANSETRON HCL 4 MG/2ML IJ SOLN
INTRAMUSCULAR | Status: DC | PRN
  Administered 2018-01-16: 4 mg via INTRAVENOUS

## 2018-01-16 MED ORDER — LACTATED RINGERS IV SOLN
INTRAVENOUS | Status: DC | PRN
  Administered 2018-01-16 (×2): via INTRAVENOUS

## 2018-01-16 MED ORDER — COCONUT OIL OIL: 1.0000 "application " | TOPICAL_OIL | Status: DC | PRN

## 2018-01-16 MED ORDER — DIPHENHYDRAMINE HCL 50 MG/ML IJ SOLN: 12.5000 mg | INTRAMUSCULAR | Status: DC | PRN

## 2018-01-16 MED ORDER — KETOROLAC TROMETHAMINE 30 MG/ML IJ SOLN
30.0000 mg | Freq: Four times a day (QID) | INTRAMUSCULAR | Status: AC | PRN
  Administered 2018-01-16: 30 mg via INTRAMUSCULAR

## 2018-01-16 MED ORDER — SCOPOLAMINE 1 MG/3DAYS TD PT72: 1.0000 | MEDICATED_PATCH | Freq: Once | TRANSDERMAL | Status: DC

## 2018-01-16 MED ORDER — DIBUCAINE 1 % RE OINT
1.0000 | TOPICAL_OINTMENT | RECTAL | Status: DC | PRN
Start: 2018-01-16 — End: 2018-01-17

## 2018-01-16 MED ORDER — WITCH HAZEL-GLYCERIN EX PADS: 1.0000 "application " | MEDICATED_PAD | CUTANEOUS | Status: DC | PRN

## 2018-01-16 MED ORDER — SODIUM CHLORIDE 0.9 % IR SOLN
Status: DC | PRN
  Administered 2018-01-16: 1

## 2018-01-16 MED ORDER — NALBUPHINE HCL 10 MG/ML IJ SOLN: 5.0000 mg | INTRAMUSCULAR | Status: DC | PRN

## 2018-01-16 MED ORDER — TETANUS-DIPHTH-ACELL PERTUSSIS 5-2.5-18.5 LF-MCG/0.5 IM SUSP: 0.5000 mL | Freq: Once | INTRAMUSCULAR | Status: DC

## 2018-01-16 MED ORDER — PROMETHAZINE HCL 25 MG/ML IJ SOLN: 6.2500 mg | INTRAMUSCULAR | Status: DC | PRN

## 2018-01-16 MED ORDER — SOD CITRATE-CITRIC ACID 500-334 MG/5ML PO SOLN
ORAL | Status: AC
  Filled 2018-01-16: qty 15

## 2018-01-16 MED ORDER — LACTATED RINGERS IV SOLN
INTRAVENOUS | Status: DC | PRN
  Administered 2018-01-16: 08:00:00 via INTRAVENOUS

## 2018-01-16 MED ORDER — SCOPOLAMINE 1 MG/3DAYS TD PT72
MEDICATED_PATCH | TRANSDERMAL | Status: AC
  Filled 2018-01-16: qty 1

## 2018-01-16 MED ORDER — NALOXONE HCL 4 MG/10ML IJ SOLN
1.0000 ug/kg/h | INTRAVENOUS | Status: DC | PRN
  Filled 2018-01-16: qty 5

## 2018-01-16 MED ORDER — PHENYLEPHRINE 8 MG IN D5W 100 ML (0.08MG/ML) PREMIX OPTIME
INJECTION | INTRAVENOUS | Status: DC | PRN
  Administered 2018-01-16: 60 ug/min via INTRAVENOUS

## 2018-01-16 MED ORDER — LACTATED RINGERS IV SOLN
INTRAVENOUS | Status: DC
  Administered 2018-01-16 (×2): via INTRAVENOUS

## 2018-01-16 MED ORDER — KETOROLAC TROMETHAMINE 30 MG/ML IJ SOLN: 30.0000 mg | Freq: Four times a day (QID) | INTRAMUSCULAR | Status: AC | PRN

## 2018-01-16 MED ORDER — DEXAMETHASONE SODIUM PHOSPHATE 4 MG/ML IJ SOLN
INTRAMUSCULAR | Status: DC | PRN
  Administered 2018-01-16: 4 mg via INTRAVENOUS

## 2018-01-16 MED ORDER — NALOXONE HCL 0.4 MG/ML IJ SOLN: 0.4000 mg | INTRAMUSCULAR | Status: DC | PRN

## 2018-01-16 MED ORDER — EPHEDRINE SULFATE 50 MG/ML IJ SOLN
INTRAMUSCULAR | Status: DC | PRN
  Administered 2018-01-16: 10 mg via INTRAVENOUS

## 2018-01-16 MED ORDER — VANCOMYCIN HCL 1000 MG IV SOLR
INTRAVENOUS | Status: DC | PRN
  Administered 2018-01-16: 1000 mg via INTRAVENOUS

## 2018-01-16 MED ORDER — DIPHENHYDRAMINE HCL 25 MG PO CAPS
25.0000 mg | ORAL_CAPSULE | ORAL | Status: DC | PRN
  Administered 2018-01-16: 25 mg via ORAL
  Filled 2018-01-16 (×2): qty 1

## 2018-01-16 MED ORDER — NALBUPHINE HCL 10 MG/ML IJ SOLN
5.0000 mg | Freq: Once | INTRAMUSCULAR | Status: AC | PRN
  Administered 2018-01-16: 5 mg via INTRAVENOUS

## 2018-01-16 MED ORDER — SOD CITRATE-CITRIC ACID 500-334 MG/5ML PO SOLN
30.0000 mL | Freq: Once | ORAL | Status: AC
  Administered 2018-01-16: 30 mL via ORAL

## 2018-01-16 MED ORDER — MORPHINE SULFATE (PF) 0.5 MG/ML IJ SOLN
INTRAMUSCULAR | Status: AC
  Filled 2018-01-16: qty 10

## 2018-01-16 MED ORDER — ONDANSETRON HCL 4 MG/2ML IJ SOLN: 4.0000 mg | Freq: Three times a day (TID) | INTRAMUSCULAR | Status: DC | PRN

## 2018-01-16 MED ORDER — MENTHOL 3 MG MT LOZG: 1.0000 | LOZENGE | OROMUCOSAL | Status: DC | PRN

## 2018-01-16 MED ORDER — PRENATAL MULTIVITAMIN CH
1.0000 | ORAL_TABLET | Freq: Every day | ORAL | Status: DC
  Administered 2018-01-16 – 2018-01-17 (×2): 1 via ORAL
  Filled 2018-01-16 (×4): qty 1

## 2018-01-16 MED ORDER — KETOROLAC TROMETHAMINE 30 MG/ML IJ SOLN
INTRAMUSCULAR | Status: AC
  Filled 2018-01-16: qty 1

## 2018-01-16 SURGICAL SUPPLY — 33 items
APL SKNCLS STERI-STRIP NONHPOA (GAUZE/BANDAGES/DRESSINGS) ×1
BARRIER ADHS 3X4 INTERCEED (GAUZE/BANDAGES/DRESSINGS) IMPLANT
BENZOIN TINCTURE PRP APPL 2/3 (GAUZE/BANDAGES/DRESSINGS) ×1 IMPLANT
BRR ADH 4X3 ABS CNTRL BYND (GAUZE/BANDAGES/DRESSINGS)
CHLORAPREP W/TINT 26ML (MISCELLANEOUS) ×2 IMPLANT
CLAMP CORD UMBIL (MISCELLANEOUS) IMPLANT
CLOTH BEACON ORANGE TIMEOUT ST (SAFETY) ×2 IMPLANT
DRSG OPSITE POSTOP 4X10 (GAUZE/BANDAGES/DRESSINGS) ×2 IMPLANT
ELECT REM PT RETURN 9FT ADLT (ELECTROSURGICAL) ×2
ELECTRODE REM PT RTRN 9FT ADLT (ELECTROSURGICAL) ×1 IMPLANT
EXTRACTOR VACUUM M CUP 4 TUBE (SUCTIONS) IMPLANT
GLOVE BIO SURGEON STRL SZ 6.5 (GLOVE) ×2 IMPLANT
GLOVE BIOGEL PI IND STRL 7.0 (GLOVE) ×1 IMPLANT
GLOVE BIOGEL PI INDICATOR 7.0 (GLOVE) ×1
GOWN STRL REUS W/TWL LRG LVL3 (GOWN DISPOSABLE) ×4 IMPLANT
KIT ABG SYR 3ML LUER SLIP (SYRINGE) IMPLANT
NDL HYPO 25X5/8 SAFETYGLIDE (NEEDLE) ×1 IMPLANT
NEEDLE HYPO 22GX1.5 SAFETY (NEEDLE) IMPLANT
NEEDLE HYPO 25X5/8 SAFETYGLIDE (NEEDLE) ×2 IMPLANT
NS IRRIG 1000ML POUR BTL (IV SOLUTION) ×2 IMPLANT
PACK C SECTION WH (CUSTOM PROCEDURE TRAY) ×2 IMPLANT
PAD OB MATERNITY 4.3X12.25 (PERSONAL CARE ITEMS) ×2 IMPLANT
PENCIL SMOKE EVAC W/HOLSTER (ELECTROSURGICAL) ×2 IMPLANT
STRIP CLOSURE SKIN 1/4X4 (GAUZE/BANDAGES/DRESSINGS) ×1 IMPLANT
SUT CHROMIC 0 CTX 36 (SUTURE) ×4 IMPLANT
SUT PLAIN 0 NONE (SUTURE) IMPLANT
SUT PLAIN 2 0 XLH (SUTURE) IMPLANT
SUT VIC AB 0 CT1 27 (SUTURE) ×6
SUT VIC AB 0 CT1 27XBRD ANBCTR (SUTURE) ×3 IMPLANT
SUT VIC AB 4-0 KS 27 (SUTURE) IMPLANT
SYR CONTROL 10ML LL (SYRINGE) IMPLANT
TOWEL OR 17X24 6PK STRL BLUE (TOWEL DISPOSABLE) ×2 IMPLANT
TRAY FOLEY W/BAG SLVR 14FR LF (SET/KITS/TRAYS/PACK) ×2 IMPLANT

## 2018-01-16 NOTE — Anesthesia Postprocedure Evaluation (Signed)
Anesthesia Post Note  Patient: Sarah Stokes  Procedure(s) Performed: REPEAT CESAREAN SECTION (N/A )     Patient location during evaluation: Mother Baby Anesthesia Type: Spinal Level of consciousness: awake and alert Pain management: pain level controlled Vital Signs Assessment: post-procedure vital signs reviewed and stable Respiratory status: spontaneous breathing, nonlabored ventilation and respiratory function stable Cardiovascular status: stable Postop Assessment: no headache, no backache, spinal receding, patient able to bend at knees, adequate PO intake, no apparent nausea or vomiting and able to ambulate Anesthetic complications: no    Last Vitals:  Vitals:   01/16/18 1212 01/16/18 1343  BP: (!) 107/52 (!) 96/58  Pulse: 70 62  Resp: 16 16  Temp: 36.5 C   SpO2: 98%     Last Pain:  Vitals:   01/16/18 1343  TempSrc: Oral  PainSc:    Pain Goal:                 Land O'LakesMalinova,Danthony Kendrix Hristova

## 2018-01-16 NOTE — Progress Notes (Signed)
H and P on the chart No changes Will proceed with Repeat C Section  Consent signed

## 2018-01-16 NOTE — Brief Op Note (Signed)
01/16/2018  8:18 AM  PATIENT:  Sarah Stokes  32 y.o. female  PRE-OPERATIVE DIAGNOSIS:  IUP at 39 weeks and 2 days Previous C Section x 2  POST-OPERATIVE DIAGNOSIS:   Same  PROCEDURE:  Procedure(s) with comments: REPEAT CESAREAN SECTION (N/A) - Repeat edc 01/21/18 allergy to vicodin, percocet, Armond Hangambien Heather K, RNFA  SURGEON:  Surgeon(s) and Role:    * [name], Tremain Rucinski, MD - Primary  PHYSICIAN ASSISTANT:   ASSISTANTS: none   ANESTHESIA:   spinal  EBL:  409 mL   BLOOD ADMINISTERED:none  DRAINS: Urinary Catheter (Foley)   LOCAL MEDICATIONS USED:  NONE  SPECIMEN:  No Specimen  DISPOSITION OF SPECIMEN:  N/A  COUNTS:  YES  TOURNIQUET:  * No tourniquets in log *  DICTATION: .Other Dictation: Dictation Number dictated  PLAN OF CARE: Admit to inpatient   PATIENT DISPOSITION:  PACU - hemodynamically stable.   Delay start of Pharmacological VTE agent (>24hrs) due to surgical blood loss or risk of bleeding: yes

## 2018-01-16 NOTE — Anesthesia Procedure Notes (Signed)
Spinal  Patient location during procedure: OR Start time: 01/16/2018 7:25 AM End time: 01/16/2018 7:28 AM Staffing Anesthesiologist: Beryle LatheBrock, Thomas E, MD Performed: anesthesiologist  Preanesthetic Checklist Completed: patient identified, surgical consent, pre-op evaluation, timeout performed, IV checked, risks and benefits discussed and monitors and equipment checked Spinal Block Patient position: sitting Prep: DuraPrep Patient monitoring: heart rate, cardiac monitor, continuous pulse ox and blood pressure Approach: midline Location: L3-4 Injection technique: single-shot Needle Needle type: Pencan  Needle gauge: 24 G Additional Notes Functioning IV was confirmed and monitors were applied. Sterile prep and drape, including hand hygiene, mask, and sterile gloves were used. The patient was positioned and the spine was prepped. The skin was anesthetized with lidocaine. Free flow of clear CSF was obtained prior to injecting local anesthetic into the CSF. The spinal needle aspirated freely following injection. The needle was carefully withdrawn. The patient tolerated the procedure well. Consent was obtained prior to the procedure with all questions answered and concerns addressed. Risks including, but not limited to, bleeding, infection, nerve damage, paralysis, failed block, inadequate analgesia, allergic reaction, high spinal, itching, and headache were discussed and the patient wished to proceed.  Leslye Peerhomas Brock, MD

## 2018-01-16 NOTE — Anesthesia Preprocedure Evaluation (Signed)
Anesthesia Evaluation  Patient identified by MRN, date of birth, ID band Patient awake    Reviewed: Allergy & Precautions, H&P , NPO status , Patient's Chart, lab work & pertinent test results  Airway Mallampati: II  TM Distance: >3 FB Neck ROM: Full    Dental no notable dental hx. (+) Teeth Intact   Pulmonary neg pulmonary ROS,    Pulmonary exam normal breath sounds clear to auscultation       Cardiovascular negative cardio ROS Normal cardiovascular exam Rhythm:Regular Rate:Normal     Neuro/Psych  Headaches, Anxiety    GI/Hepatic negative GI ROS, Neg liver ROS,   Endo/Other  Obesity  Renal/GU negative Renal ROS     Musculoskeletal   Abdominal   Peds  (+) ATTENTION DEFICIT DISORDER WITHOUT HYPERACTIVITY Hematology   Anesthesia Other Findings Raynaud's syndrome  Reproductive/Obstetrics (+) Pregnancy                             Anesthesia Physical  Anesthesia Plan  ASA: II  Anesthesia Plan: Spinal   Post-op Pain Management:    Induction:   PONV Risk Score and Plan: Treatment may vary due to age or medical condition, Ondansetron and Scopolamine patch - Pre-op  Airway Management Planned: Natural Airway  Additional Equipment: None  Intra-op Plan:   Post-operative Plan:   Informed Consent: I have reviewed the patients History and Physical, chart, labs and discussed the procedure including the risks, benefits and alternatives for the proposed anesthesia with the patient or authorized representative who has indicated his/her understanding and acceptance.     Plan Discussed with: Anesthesiologist and CRNA  Anesthesia Plan Comments: (Labs reviewed. Platelets acceptable, patient not taking any blood thinning medications. Risks and benefits discussed with patient, patient expressed understanding and wished to proceed.)        Anesthesia Quick Evaluation

## 2018-01-16 NOTE — Transfer of Care (Signed)
Immediate Anesthesia Transfer of Care Note  Patient: Sarah RowanConstance Stokes  Procedure(s) Performed: REPEAT CESAREAN SECTION (N/A )  Patient Location: PACU  Anesthesia Type:Spinal  Level of Consciousness: awake, alert  and oriented  Airway & Oxygen Therapy: Patient Spontanous Breathing  Post-op Assessment: Report given to RN and Post -op Vital signs reviewed and stable  Post vital signs: Reviewed and stable   HR 84 BP 127/85 Resp 13 Sats 99%   Last Vitals:  Vitals Value Taken Time  BP 127/85 01/16/2018  8:30 AM  Temp    Pulse 80 01/16/2018  8:33 AM  Resp 17 01/16/2018  8:33 AM  SpO2 100 % 01/16/2018  8:33 AM  Vitals shown include unvalidated device data.  Last Pain:  Vitals:   01/16/18 0553  TempSrc:   PainSc: 0-No pain         Complications: No apparent anesthesia complications

## 2018-01-16 NOTE — Lactation Note (Signed)
This note was copied from a baby's chart. Lactation Consultation Note  Patient Name: Sarah Hassan RowanConstance Stokes WUJWJ'XToday's Date: 01/16/2018 Reason for consult: Initial assessment;Term  510 hours old FT female who is being exclusively BF by her mother, she's a P3 and experienced BF.  She was able to BF her first child for 8 weeks and her second one for 6 weeks. She self reported some BF difficulties with her last baby, she had to use a NS because baby had a difficult latch. She already knows how to hand express and saw colostrum.   When LC did some hand expression colostrum was pouring out of her right breast. However no colostrum was seen on her left breast, her tissue was also challenging and semi-compressible, her nipples were not fully everted but somehow semi-flat at rest; especially the left one. Set mom up with breast shells, instructions, cleaning and storage was reviewed. Mom brought a bra to the hospital and will start wearing her shells tomorrow.   Offered assistance with latch but mom politely declined stating that baby has already fed, he was asleep and swaddled in grandma's arms. Per mom feedings at the breast are comfortable but she hasn't paid attention to see if she can hear swallows. Asked mom to call for latch assistance when needed.  Encouraged mom to feed baby STS 8-12 times in 24 hours or sooner if feeding cues are present. Reviewed BF brochure, BF resources and feeding diary, mom is aware of LC services and will call PRN.  Maternal Data Formula Feeding for Exclusion: No Has patient been taught Hand Expression?: Yes Does the patient have breastfeeding experience prior to this delivery?: Yes  Feeding Feeding Type: Breast Fed Length of feed: 20 min  Interventions Interventions: Breast feeding basics reviewed;Hand express;Breast compression;Breast massage;Shells;Reverse pressure  Lactation Tools Discussed/Used Tools: Shells Shell Type: Inverted WIC Program: No   Consult  Status Consult Status: Follow-up Date: 01/17/18 Follow-up type: In-patient    [name] 01/16/2018, 6:09 PM

## 2018-01-16 NOTE — Anesthesia Postprocedure Evaluation (Signed)
Anesthesia Post Note  Patient: Sarah RowanConstance Stokes  Procedure(s) Performed: REPEAT CESAREAN SECTION (N/A )     Patient location during evaluation: PACU Anesthesia Type: Spinal Level of consciousness: awake and alert Pain management: pain level controlled Vital Signs Assessment: post-procedure vital signs reviewed and stable Respiratory status: spontaneous breathing and respiratory function stable Cardiovascular status: blood pressure returned to baseline and stable Postop Assessment: spinal receding and no apparent nausea or vomiting Anesthetic complications: no    Last Vitals:  Vitals:   01/16/18 0921 01/16/18 0922  BP:    Pulse: 72 70  Resp: 17 11  Temp:    SpO2: 99% 100%    Last Pain:  Vitals:   01/16/18 0831  TempSrc:   PainSc: 0-No pain   Pain Goal:                 Beryle Lathehomas E Brock

## 2018-01-16 NOTE — Addendum Note (Signed)
Addendum  created 01/16/18 1523 by Elgie CongoMalinova, Verlyn Dannenberg H, CRNA   Sign clinical note

## 2018-01-17 ENCOUNTER — Encounter (HOSPITAL_COMMUNITY): Payer: Self-pay | Admitting: Obstetrics and Gynecology

## 2018-01-17 LAB — CBC
HCT: 31.3 % — ABNORMAL LOW (ref 36.0–46.0)
HEMOGLOBIN: 10.3 g/dL — AB (ref 12.0–15.0)
MCH: 29.4 pg (ref 26.0–34.0)
MCHC: 32.9 g/dL (ref 30.0–36.0)
MCV: 89.4 fL (ref 78.0–100.0)
Platelets: 166 10*3/uL (ref 150–400)
RBC: 3.5 MIL/uL — AB (ref 3.87–5.11)
RDW: 13.7 % (ref 11.5–15.5)
WBC: 15.6 10*3/uL — ABNORMAL HIGH (ref 4.0–10.5)

## 2018-01-17 MED ORDER — TRAMADOL HCL 50 MG PO TABS: 50.0000 mg | ORAL_TABLET | Freq: Four times a day (QID) | ORAL | 0 refills | Status: DC | PRN

## 2018-01-17 MED ORDER — IBUPROFEN 600 MG PO TABS: 600.0000 mg | ORAL_TABLET | Freq: Four times a day (QID) | ORAL | 0 refills | Status: DC

## 2018-01-17 NOTE — Progress Notes (Signed)
Removed pts IV this AM @9 :25. IV intact, pt tolerated well.

## 2018-01-17 NOTE — Lactation Note (Signed)
This note was copied from a baby's chart. Lactation Consultation Note  Patient Name: Boy Hassan RowanConstance Coolman [id number]'BToday's Date: 01/17/2018 Reason for consult: Follow-up assessment Mom states that baby is latching easily and feeding well.  This is her third baby and she remembers what to expect with milk coming to volume.  No questions or concerns at present time.  Lactation outpatient services and support reviewed and encouraged prn.  Maternal Data    Feeding    LATCH Score                   Interventions    Lactation Tools Discussed/Used     Consult Status Consult Status: Complete Follow-up type: Call as needed    Huston FoleyMOULDEN, Tiernan Millikin S 01/17/2018, 10:05 AM

## 2018-01-17 NOTE — Discharge Summary (Signed)
Obstetric Discharge Summary Reason for Admission: cesarean section Prenatal Procedures: none Intrapartum Procedures: cesarean: low cervical, transverse Postpartum Procedures: none Complications-Operative and Postpartum: none Hemoglobin  Date Value Ref Range Status  01/17/2018 10.3 (L) 12.0 - 15.0 g/dL Final  16/10/960411/03/2017 54.012.5 11.1 - 15.9 g/dL Final   HCT  Date Value Ref Range Status  01/17/2018 31.3 (L) 36.0 - 46.0 % Final   Hematocrit  Date Value Ref Range Status  05/19/2017 38.2 34.0 - 46.6 % Final    Physical Exam:  General: alert and cooperative Lochia: appropriate Uterine Fundus: firm Incision: healing well DVT Evaluation: No evidence of DVT seen on physical exam.  Discharge Diagnoses: Term Pregnancy-delivered  Discharge Information: Date: 01/17/2018 Activity: pelvic rest Diet: routine Medications: PNV, Ibuprofen and ultram Condition: stable Instructions: refer to practice specific booklet Discharge to: home Follow-up Information    Sarah Stokes, [name], MD. Schedule an appointment as soon as possible for a visit in 1 week(s).   Specialty:  Obstetrics and Gynecology Contact information: [address] [place] [address] [phone]           Newborn Data: Live born female  Birth Weight: 7 lb 13.8 oz (3566 g) APGAR: 9, 9  Newborn Delivery   Birth date/time:  01/16/2018 07:51:00 Delivery type:  C-Section, Low Transverse Trial of labor:  No C-section categorization:  Repeat     Home with mother.  Sarah Stokes 01/17/2018, 8:08 AM

## 2018-01-19 NOTE — Op Note (Signed)
NAMHassan Stokes: Belzer, Alisha MEDICAL RECORD [id number] ACCOUNT [id number] DATE OF BIRTH:05-04-1986 FACILITY: [facility] LOCATION: [id number] PHYSICIAN:Sheza Strickland Rosita FireL. Analiese Krupka, MD  OPERATIVE REPORT  DATE OF PROCEDURE:  01/16/2018  PREOPERATIVE DIAGNOSIS:  Intrauterine pregnancy at 39 and 2 and previous cesarean section x2.  POSTOPERATIVE DIAGNOSIS:  Intrauterine pregnancy at 39 and 2 and previous cesarean section x2.  PROCEDURE:  Repeat low transverse cesarean section.  SURGEON:  Marcelle OverlieMichelle Betzy Barbier, MD  ANESTHESIA:  Spinal.  ESTIMATED BLOOD LOSS:  500 mL.  COMPLICATIONS:  None.  DRAINS:  Foley catheter.  PATHOLOGY:  None.  DESCRIPTION OF PROCEDURE:  The patient was taken to the operating room.  Her spinal was placed by anesthesia without incident.  She was prepped and draped.  A Foley catheter was inserted in the standard procedure.  A timeout was performed.  Allis test  was good.  A small incision was made.  A Pfannenstiel incision was carried down to the fascia.  Fascia was scored in the midline and extended laterally.  The peritoneum was entered with Metzenbaum scissors.  The peritoneal incision was then stretched.  The lower uterine segment was identified.  The  bladder flap was then created sharply and the bladder blade was then readjusted.  A low transverse incision was made in the uterus and the uterus was entered with a hemostat.  The low transverse incision was made.  The baby was delivered once we entered the uterine cavity.  The baby was  delivered easily in cephalic presentation.  Cord was clamped and cut.  The baby was handed to the awaiting neonatal team.  The placenta was manually removed and noted to be normal and intact with a three vessel cord.  The uterus was cleared of all clots  and debris.  The uterine incision was closed in one layer after placenta was removed and noted to be intact.  It was closed with 0 Vicry ln a running stitch.  The uterus was returned to the abdomen.   Irrigation was performed.  The peritoneum was closed using 0 Vicryl.  The  fascia was closed using 0 Vicryl starting at each corner and meeting in the midline.  The skin was closed with a subcutaneous 4-0 Vicryl suture.  All sponge, lap and instrument counts were correct x2.  The patient went to recovery room in stable  condition.     TN/NUANCE  D:01/18/2018 T:01/18/2018 JOB:[id number]

## 2018-01-25 ENCOUNTER — Encounter (HOSPITAL_COMMUNITY): Payer: Self-pay

## 2018-01-25 ENCOUNTER — Inpatient Hospital Stay (HOSPITAL_COMMUNITY)
Admission: AD | Admit: 2018-01-25 | Discharge: 2018-01-26 | Disposition: A | Discharge status: Home / Self Care | Payer: BLUE CROSS/BLUE SHIELD | Source: Ambulatory Visit | Attending: Obstetrics & Gynecology | Admitting: Obstetrics & Gynecology

## 2018-01-25 DIAGNOSIS — Z885 Allergy status to narcotic agent status: Secondary | ICD-10-CM | POA: Insufficient documentation

## 2018-01-25 DIAGNOSIS — O902 Hematoma of obstetric wound: Secondary | ICD-10-CM | POA: Insufficient documentation

## 2018-01-25 DIAGNOSIS — Z881 Allergy status to other antibiotic agents status: Secondary | ICD-10-CM | POA: Insufficient documentation

## 2018-01-25 DIAGNOSIS — Z888 Allergy status to other drugs, medicaments and biological substances status: Secondary | ICD-10-CM | POA: Insufficient documentation

## 2018-01-25 DIAGNOSIS — O9089 Other complications of the puerperium, not elsewhere classified: Secondary | ICD-10-CM

## 2018-01-26 DIAGNOSIS — O902 Hematoma of obstetric wound: Secondary | ICD-10-CM

## 2018-01-26 DIAGNOSIS — Z881 Allergy status to other antibiotic agents status: Secondary | ICD-10-CM | POA: Diagnosis not present

## 2018-01-26 DIAGNOSIS — Z888 Allergy status to other drugs, medicaments and biological substances status: Secondary | ICD-10-CM | POA: Diagnosis not present

## 2018-01-26 DIAGNOSIS — O9089 Other complications of the puerperium, not elsewhere classified: Secondary | ICD-10-CM | POA: Diagnosis present

## 2018-01-26 DIAGNOSIS — Z885 Allergy status to narcotic agent status: Secondary | ICD-10-CM | POA: Diagnosis not present

## 2018-01-26 NOTE — Discharge Instructions (Signed)
Seroma A seroma is a collection of fluid on the body that looks like swelling or a mass. Seromas form where tissue has been injured or cut. Seromas vary in size. Some are small and painless. Others may become large and cause pain or discomfort. Many seromas go away on their own as the fluid is naturally absorbed by the body, and some seromas need to be drained. What are the causes? Seromas form as the result of damage to tissue or the removal of tissue. This tissue damage may occur during surgery or because of an injury or trauma. When tissue is disrupted or removed, empty space is created. The body's natural defense system (immune system) causes fluid to enter the empty space and form a seroma. What are the signs or symptoms? Symptoms of this condition include:  Swelling at the site of a surgical cut (incision) or an injury.  Drainage of clear fluid at the surgery or injury site.  Discomfort or pain.  How is this diagnosed? This condition is diagnosed based on your symptoms, your medical history, and a physical exam. During the exam, your health care provider will press on the seroma. You may also have tests, including:  Blood tests.  Imaging tests, such as an ultrasound or CT scan.  How is this treated? Some seromas go away (resolve) on their own. Your health care provider may monitor you to make sure the seroma does not cause any complications. If your seroma does not resolve on its own, treatment may include:  Using a needle to drain the fluid from the seroma (needle aspiration).  Inserting a flexible tube (catheter) to drain the fluid.  Applying a bandage (dressing), such as an elastic bandage or binder.  Antibiotic medicines, if the seroma becomes infected.  In rare cases, surgery may be done to remove the seroma and repair the area. Follow these instructions at home:  If you were prescribed an antibiotic medicine, take it as told by your health care provider. Do not stop  taking the antibiotic even if you start to feel better.  Return to your normal activities as told by your health care provider. Ask your health care provider what activities are safe for you.  Take over-the-counter and prescription medicines only as told by your health care provider.  Check your seroma every day for signs of infection. Check for: ? Redness or pain. ? Fluid or pus. ? More swelling. ? Warmth.  Keep all follow-up visits as told by your health care provider. This is important. Contact a health care provider if:  You have a fever.  You have redness or pain at the site of the seroma.  You have fluid or pus coming from the seroma.  Your seroma is more swollen or is getting bigger.  Your seroma is warm to the touch. This information is not intended to replace advice given to you by your health care provider. Make sure you discuss any questions you have with your health care provider. Document Released: 10/22/2012 Document Revised: 04/08/2016 Document Reviewed: 04/08/2016 Elsevier Interactive Patient Education  2018 Elsevier Inc.  

## 2018-01-26 NOTE — MAU Provider Note (Signed)
Chief Complaint:  Postpartum Complications and Post-op Problem   First Provider Initiated Contact with Patient 01/26/18 0003     HPI: Sarah Stokes is a 32 y.o. G3P3003 who presents to maternity admissions reporting leakage of bloody fluid from right side of incision.  Had a C/S on 7/9.  Was sitting tonight and felt wetness.  Saw clothes with large area of blood on them.  Currently not leaking. . She reports no vaginal bleeding, vaginal itching/burning, urinary symptoms, h/a, dizziness, n/v, or fever/chills.   Other  This is a new problem. The current episode started today. The problem occurs intermittently. The problem has been rapidly improving. Pertinent negatives include no abdominal pain, chills, fever, myalgias or nausea. Nothing aggravates the symptoms. She has tried nothing for the symptoms.   RN Note: Delivered 7/9 via Repeat Csection.  Noticed her incision had opened tonight and had some bleeding.  Placed a pad as a dressing, only a scant amount of blood on there now.  No other complaints.      Past Medical History: Past Medical History:  Diagnosis Date  . Anxiety   . History of Raynaud's syndrome   . Hx of varicella   . Migraine 2006  . Periorbital cellulitis   . Postpartum care following cesarean delivery (10/6) 04/16/2013  . Status post primary low transverse cesarean section 04/15/2013    Past obstetric history: OB History  Gravida Para Term Preterm AB Living  3 3 3     3   SAB TAB Ectopic Multiple Live Births        0 3    # Outcome Date GA Lbr Len/2nd Weight Sex Delivery Anes PTL Lv  3 Term 01/16/18 [redacted]w[redacted]d  7 lb 13.8 oz (3.566 kg) M CS-LTranv Spinal  LIV  2 Term 08/24/15 [redacted]w[redacted]d  7 lb 9.5 oz (3.445 kg) M CS-Vac Spinal  LIV  1 Term 04/15/13 [redacted]w[redacted]d  6 lb 14.2 oz (3.125 kg) M CS-LTranv EPI  LIV    Past Surgical History: Past Surgical History:  Procedure Laterality Date  . CESAREAN SECTION N/A 04/15/2013   Procedure: CESAREAN SECTION;  Surgeon: Robley Fries, MD;   Location: [facility]S;  Service: Obstetrics;  Laterality: N/A;  . CESAREAN SECTION N/A 08/24/2015   Procedure: CESAREAN SECTION;  Surgeon: Marcelle Overlie, MD;  Location: [facility] ORS;  Service: Obstetrics;  Laterality: N/A;  REPEAT EDC 08/28/15   . CESAREAN SECTION N/A 01/16/2018   Procedure: REPEAT CESAREAN SECTION;  Surgeon: Marcelle Overlie, MD;  Location: [facility] BIRTHING SUITES;  Service: Obstetrics;  Laterality: N/A;  Repeat edc 01/21/18 allergy to vicodin, percocet, Armond Hang, RNFA  . HIP SURGERY     took bone for knee  . KNEE SURGERY     left x6, Dr. Eulah Pont  . labial repair  2006  . TONSILLECTOMY      Family History: Family History  Problem Relation Age of Onset  . Thyroid disease Mother   . Cancer Mother        melanoma  . Allergic rhinitis Mother   . Diabetes Maternal Aunt   . Diabetes Maternal Uncle   . Thyroid disease Paternal Grandmother   . Allergic rhinitis Father   . Allergic rhinitis Sister   . Allergic rhinitis Brother     Social History: Social History   Tobacco Use  . Smoking status: Never Smoker  . Smokeless tobacco: Never Used  Substance Use Topics  . Alcohol use: No  . Drug use: No    Allergies:  Allergies  Allergen Reactions  . Ambien [Zolpidem Tartrate]     Hallucinations  . Clindamycin Swelling    GI Intolerance  . Percocet [Oxycodone-Acetaminophen] Hives and Nausea And Vomiting  . Vicodin [Hydrocodone-Acetaminophen] Hives and Nausea And Vomiting  . Adhesive [Tape] Rash    Tape from epidural     Meds:  Medications Prior to Admission  Medication Sig Dispense Refill Last Dose  . acetaminophen (TYLENOL) 500 MG tablet Take 500-1,000 mg by mouth daily as needed for headache.     . fluticasone (FLONASE) 50 MCG/ACT nasal spray Place 1 spray into both nostrils daily as needed for allergies or rhinitis.     Marland Kitchen. ibuprofen (ADVIL,MOTRIN) 600 MG tablet Take 1 tablet (600 mg total) by mouth every 6 (six) hours. 30 tablet 0   . Prenatal Vit-Fe Fumarate-FA  (PRENATAL MULTIVITAMIN) TABS tablet Take 1 tablet by mouth every evening.    11/01/2017 at Unknown time  . traMADol (ULTRAM) 50 MG tablet Take 1 tablet (50 mg total) by mouth every 6 (six) hours as needed. 20 tablet 0     I have reviewed patient's Past Medical Hx, Surgical Hx, Family Hx, Social Hx, medications and allergies.  ROS:  Review of Systems  Constitutional: Negative for chills and fever.  Gastrointestinal: Negative for abdominal pain and nausea.  Musculoskeletal: Negative for myalgias.   Other systems negative     Physical Exam   Patient Vitals for the past 24 hrs:  BP Temp Pulse Resp Height Weight  01/25/18 2311 114/65 98.8 F (37.1 C) (!) 56 19 - -  01/25/18 2305 - - - - 5\' 2"  (1.575 m) 155 lb 9 oz (70.6 kg)   Constitutional: Well-developed, well-nourished female in no acute distress.  Cardiovascular: normal rate and rhythm Respiratory: normal effort, no distress. GI: Abd soft, non-tender.  Nondistended.  No rebound, No guarding.         Incision is healing well.  Subcuticular sutures are intact.  Small 1-502mm area on right of incision with dried        blood on it.  No dehiscence seen at all.  Unable to insert swab anywhere on incision. Unable to express        any fluid from incision now.  MS: Extremities nontender, no edema, normal ROM Neurologic: Alert and oriented x 4.   Grossly nonfocal. GU: Neg CVAT. Skin:  Warm and Dry Psych:  Affect appropriate.   Labs: No results found for this or any previous visit (from the past 24 hour(s)). --/--/O POS (07/08 0950)  Imaging:  No results found.  MAU Course/MDM: I have ordered labs as follows: none Imaging ordered: none   Consult Dr Langston MaskerMorris who authorized discharge home.  Pt is to followup in office for reassessment of incision..   Treatments in MAU included dry dressing over incision We discussed signs of dehiscence to report. .Discussed nature of seromas Pt stable at time of  discharge.  Assessment: Postoperative cesarean section Wound seroma No dehiscence  Plan: Discharge home Recommend Keep wound clean and dry  Encouraged to return here or to other Urgent Care/ED if she develops worsening of symptoms, increase in pain, fever, or other concerning symptoms.   Wynelle BourgeoisMarie Williams CNM, MSN Certified Nurse-Midwife 01/26/2018 12:03 AM

## 2018-06-29 ENCOUNTER — Telehealth: Payer: Self-pay

## 2018-06-29 MED ORDER — OSELTAMIVIR PHOSPHATE 75 MG PO CAPS: 75.0000 mg | ORAL_CAPSULE | Freq: Every day | ORAL | 0 refills | Status: DC

## 2018-06-29 NOTE — Telephone Encounter (Signed)
Ok for Tamiflu 75mg daily x10 days 

## 2018-06-29 NOTE — Telephone Encounter (Signed)
Copied from CRM [id number]. Topic: General - Other >> Jun 29, 2018 12:49 PM Percival SpanishKennedy, Cheryl W wrote:  Pt call to say her son was diag with the flu and she calling to ask if Tamiflu can be called in for her    [facility]

## 2018-06-29 NOTE — Addendum Note (Signed)
Addended by: Geannie RisenBRODMERKEL, Jamyson Jirak L on: 06/29/2018 01:22 PM   Modules accepted: Orders

## 2018-06-29 NOTE — Telephone Encounter (Signed)
Please advise 

## 2018-08-20 ENCOUNTER — Other Ambulatory Visit: Payer: Self-pay

## 2018-08-20 ENCOUNTER — Ambulatory Visit: Payer: Self-pay

## 2018-08-20 ENCOUNTER — Encounter: Payer: Self-pay | Admitting: Physician Assistant

## 2018-08-20 ENCOUNTER — Ambulatory Visit (INDEPENDENT_AMBULATORY_CARE_PROVIDER_SITE_OTHER): Payer: BLUE CROSS/BLUE SHIELD | Admitting: Physician Assistant

## 2018-08-20 VITALS — BP 92/70 | HR 65 | Temp 98.4°F | Resp 14 | Ht 63.0 in | Wt 147.0 lb

## 2018-08-20 DIAGNOSIS — J019 Acute sinusitis, unspecified: Secondary | ICD-10-CM

## 2018-08-20 DIAGNOSIS — B9689 Other specified bacterial agents as the cause of diseases classified elsewhere: Secondary | ICD-10-CM

## 2018-08-20 DIAGNOSIS — Z20828 Contact with and (suspected) exposure to other viral communicable diseases: Secondary | ICD-10-CM

## 2018-08-20 DIAGNOSIS — J020 Streptococcal pharyngitis: Secondary | ICD-10-CM | POA: Diagnosis not present

## 2018-08-20 LAB — POCT RAPID STREP A (OFFICE): Rapid Strep A Screen: POSITIVE — AB

## 2018-08-20 LAB — POC INFLUENZA A&B (BINAX/QUICKVUE)
INFLUENZA B, POC: NEGATIVE
Influenza A, POC: NEGATIVE

## 2018-08-20 MED ORDER — AMOXICILLIN-POT CLAVULANATE 875-125 MG PO TABS: 1.0000 | ORAL_TABLET | Freq: Two times a day (BID) | ORAL | 0 refills | Status: DC

## 2018-08-20 MED ORDER — BENZONATATE 100 MG PO CAPS: 100.0000 mg | ORAL_CAPSULE | Freq: Three times a day (TID) | ORAL | 0 refills | Status: DC | PRN

## 2018-08-20 NOTE — Progress Notes (Signed)
Patient presents to clinic today c/o 1 week of nasal congestion, rhinorrhea, sore throat and cough with acute worsening of symptoms x 2 days. Now with fever, sinus pain, ear pain and fatigue. Denies chest pain, SOB. Denies recent travel. Son sick last week with Flu B. She has completed preventive course of Tamiflu.   Past Medical History:  Diagnosis Date  . Anxiety   . History of Raynaud's syndrome   . Hx of varicella   . Migraine 2006  . Periorbital cellulitis   . Postpartum care following cesarean delivery (10/6) 04/16/2013  . Status post primary low transverse cesarean section 04/15/2013    Current Outpatient Medications on File Prior to Visit  Medication Sig Dispense Refill  . Prenatal Vit-Fe Fumarate-FA (PRENATAL MULTIVITAMIN) TABS tablet Take 1 tablet by mouth every evening.      No current facility-administered medications on file prior to visit.     Allergies  Allergen Reactions  . Ambien [Zolpidem Tartrate]     Hallucinations  . Clindamycin Swelling    GI Intolerance  . Percocet [Oxycodone-Acetaminophen] Hives and Nausea And Vomiting  . Vicodin [Hydrocodone-Acetaminophen] Hives and Nausea And Vomiting  . Adhesive [Tape] Rash    Tape from epidural     Family History  Problem Relation Age of Onset  . Thyroid disease Mother   . Cancer Mother        melanoma  . Allergic rhinitis Mother   . Diabetes Maternal Aunt   . Diabetes Maternal Uncle   . Thyroid disease Paternal Grandmother   . Allergic rhinitis Father   . Allergic rhinitis Sister   . Allergic rhinitis Brother     Social History   Socioeconomic History  . Marital status: Married    Spouse name: Not on file  . Number of children: Not on file  . Years of education: Not on file  . Highest education level: Not on file  Occupational History  . Not on file  Social Needs  . Financial resource strain: Not on file  . Food insecurity:    Worry: Not on file    Inability: Not on file  . Transportation  needs:    Medical: Not on file    Non-medical: Not on file  Tobacco Use  . Smoking status: Never Smoker  . Smokeless tobacco: Never Used  Substance and Sexual Activity  . Alcohol use: No  . Drug use: No  . Sexual activity: Yes    Birth control/protection: None  Lifestyle  . Physical activity:    Days per week: Not on file    Minutes per session: Not on file  . Stress: Not on file  Relationships  . Social connections:    Talks on phone: Not on file    Gets together: Not on file    Attends religious service: Not on file    Active member of club or organization: Not on file    Attends meetings of clubs or organizations: Not on file    Relationship status: Not on file  Other Topics Concern  . Not on file  Social History Narrative   Graduated from [employer], going to [employer].   Review of Systems - See HPI.  All other ROS are negative.  BP 92/70   Pulse 65   Temp 98.4 F (36.9 C) (Oral)   Resp 14   Ht 5\' 3"  (1.6 m)   Wt 147 lb (66.7 kg)   SpO2 97%   Breastfeeding No  BMI 26.04 kg/m   Physical Exam Vitals signs reviewed.  Constitutional:      Appearance: Normal appearance.  HENT:     Head: Normocephalic and atraumatic.     Right Ear: Tympanic membrane normal.     Left Ear: Tympanic membrane normal.     Nose: Congestion and rhinorrhea present.     Right Sinus: Frontal sinus tenderness present.     Left Sinus: Frontal sinus tenderness present.     Mouth/Throat:     Pharynx: Oropharynx is clear. Posterior oropharyngeal erythema present.     Tonsils: No tonsillar exudate.  Eyes:     Conjunctiva/sclera: Conjunctivae normal.  Neck:     Musculoskeletal: Neck supple.  Cardiovascular:     Rate and Rhythm: Normal rate and regular rhythm.     Pulses: Normal pulses.     Heart sounds: Normal heart sounds.  Pulmonary:     Effort: Pulmonary effort is normal.     Breath sounds: Normal breath sounds.  Neurological:     Mental Status: She is alert.     Recent Results (from the past 2160 hour(s))  POCT rapid strep A     Status: Abnormal   Collection Time: 08/20/18 11:16 AM  Result Value Ref Range   Rapid Strep A Screen Positive (A) Negative    Assessment/Plan: 1. Exposure to the flu Negative flu swab. Has taken preventive tamilfu. Nothing further needed. - POC Influenza A&B(BINAX/QUICKVUE) - POCT rapid strep A  2. Strep throat 3. Acute bacterial sinusitis Rx Augmentin.  Increase fluids.  Rest.  Saline nasal spray.  Probiotic.  Mucinex as directed.  Humidifier in bedroom. Tessalon per orders.  Call or return to clinic if symptoms are not improving.  - amoxicillin-clavulanate (AUGMENTIN) 875-125 MG tablet; Take 1 tablet by mouth 2 (two) times daily.  Dispense: 20 tablet; Refill: 0 - benzonatate (TESSALON) 100 MG capsule; Take 1 capsule (100 mg total) by mouth 3 (three) times daily as needed for cough.  Dispense: 30 capsule; Refill: 0   Piedad ClimesWilliam Cody Taraoluwa Thakur, PA-C

## 2018-08-20 NOTE — Telephone Encounter (Signed)
Patient is here for an appointment for assessment.

## 2018-08-20 NOTE — Patient Instructions (Signed)
Please take antibiotic as directed.  Increase fluid intake.  Use Saline nasal spray.  Take a daily multivitamin. Use Tessalon as directed for cough. Start salt-water gargles.  Alternate tylenol and ibuprofen for fever and aches.  Place a humidifier in the bedroom.  Please call or return clinic if symptoms are not improving.  Sinusitis Sinusitis is redness, soreness, and swelling (inflammation) of the paranasal sinuses. Paranasal sinuses are air pockets within the bones of your face (beneath the eyes, the middle of the forehead, or above the eyes). In healthy paranasal sinuses, mucus is able to drain out, and air is able to circulate through them by way of your nose. However, when your paranasal sinuses are inflamed, mucus and air can become trapped. This can allow bacteria and other germs to grow and cause infection. Sinusitis can develop quickly and last only a short time (acute) or continue over a long period (chronic). Sinusitis that lasts for more than 12 weeks is considered chronic.  CAUSES  Causes of sinusitis include:  Allergies.  Structural abnormalities, such as displacement of the cartilage that separates your nostrils (deviated septum), which can decrease the air flow through your nose and sinuses and affect sinus drainage.  Functional abnormalities, such as when the small hairs (cilia) that line your sinuses and help remove mucus do not work properly or are not present. SYMPTOMS  Symptoms of acute and chronic sinusitis are the same. The primary symptoms are pain and pressure around the affected sinuses. Other symptoms include:  Upper toothache.  Earache.  Headache.  Bad breath.  Decreased sense of smell and taste.  A cough, which worsens when you are lying flat.  Fatigue.  Fever.  Thick drainage from your nose, which often is green and may contain pus (purulent).  Swelling and warmth over the affected sinuses. DIAGNOSIS  Your caregiver will perform a physical exam.  During the exam, your caregiver may:  Look in your nose for signs of abnormal growths in your nostrils (nasal polyps).  Tap over the affected sinus to check for signs of infection.  View the inside of your sinuses (endoscopy) with a special imaging device with a light attached (endoscope), which is inserted into your sinuses. If your caregiver suspects that you have chronic sinusitis, one or more of the following tests may be recommended:  Allergy tests.  Nasal culture A sample of mucus is taken from your nose and sent to a lab and screened for bacteria.  Nasal cytology A sample of mucus is taken from your nose and examined by your caregiver to determine if your sinusitis is related to an allergy. TREATMENT  Most cases of acute sinusitis are related to a viral infection and will resolve on their own within 10 days. Sometimes medicines are prescribed to help relieve symptoms (pain medicine, decongestants, nasal steroid sprays, or saline sprays).  However, for sinusitis related to a bacterial infection, your caregiver will prescribe antibiotic medicines. These are medicines that will help kill the bacteria causing the infection.  Rarely, sinusitis is caused by a fungal infection. In theses cases, your caregiver will prescribe antifungal medicine. For some cases of chronic sinusitis, surgery is needed. Generally, these are cases in which sinusitis recurs more than 3 times per year, despite other treatments. HOME CARE INSTRUCTIONS   Drink plenty of water. Water helps thin the mucus so your sinuses can drain more easily.  Use a humidifier.  Inhale steam 3 to 4 times a day (for example, sit in the bathroom with  the shower running).  Apply a warm, moist washcloth to your face 3 to 4 times a day, or as directed by your caregiver.  Use saline nasal sprays to help moisten and clean your sinuses.  Take over-the-counter or prescription medicines for pain, discomfort, or fever only as directed by  your caregiver. SEEK IMMEDIATE MEDICAL CARE IF:  You have increasing pain or severe headaches.  You have nausea, vomiting, or drowsiness.  You have swelling around your face.  You have vision problems.  You have a stiff neck.  You have difficulty breathing. MAKE SURE YOU:   Understand these instructions.  Will watch your condition.  Will get help right away if you are not doing well or get worse. Document Released: 06/27/2005 Document Revised: 09/19/2011 Document Reviewed: 07/12/2011 [facility] Patient Information 2014 [place].

## 2018-08-20 NOTE — Telephone Encounter (Addendum)
Pt calling with sore throat, moderate in intensity, exposure to Flu (son was diagnosed 08/13/18). Pt stated she has a white coating to throat. Pt reported a fever to 102.5 but is afebrile today. Head and chest congestion. Pt stated that she is coughing up yellow green secretions, earache yesterday.  Pt given home instructions. [facility] Asked to make pt an appointment for flu/strep throat testing.  Care advice given and pt verbalized understanding. No international travel in the last 30 days. No appointments today with PCP. Pt asking to come in today. Pt given appointment this morning with Sherley BoundsWilliam "Cody" HarlemMartin PA at 11:00.   Reason for Disposition . [1] Sore throat with cough/cold symptoms AND [2] present < 5 days  Answer Assessment - Initial Assessment Questions 1. TYPE of EXPOSURE: "How were you exposed?" (e.g., close contact, not a close contact)     Son was diagnosed with Flu 2/3 2. DATE of EXPOSURE: "When did the exposure occur?" (e.g., hour, days, weeks)     Last week  3. PREGNANCY: "Is there any chance you are pregnant?" "When was your last menstrual period?"     No LMP: January 4. HIGH RISK for COMPLICATIONS: "Do you have any heart or lung problems? Do you have a weakened immune system?" (e.g., CHF, COPD, asthma, HIV positive, chemotherapy, renal failure, diabetes mellitus, sickle cell anemia)     No-no 5. SYMPTOMS: "Do you have any symptoms?" (e.g., cough, fever, sore throat, difficulty breathing).     Cough,white coating to throat, fever to 102.5 sore throat chills and a fever, head congestion, runny nose- when cough yellow green secretions. Fever broke and woke soaking wet. Earache yesterday.  Answer Assessment - Initial Assessment Questions 1. ONSET: "When did the throat start hurting?" (Hours or days ago)      yesterfday 2. SEVERITY: "How bad is the sore throat?" (Scale 1-10; mild, moderate or severe)   - MILD (1-3):  doesn't interfere  with eating or normal activities   - MODERATE (4-7): interferes with eating some solids and normal activities   - SEVERE (8-10):  excruciating pain, interferes with most normal activities   - SEVERE DYSPHAGIA: can't swallow liquids, drooling     Moderate 7/10 3. STREP EXPOSURE: "Has there been any exposure to strep within the past week?" If so, ask: "What type of contact occurred?"      no 4.  VIRAL SYMPTOMS: "Are there any symptoms of a cold, such as a runny nose, cough, hoarse voice or red eyes?"      Congested nose, hoarse, cough 5. FEVER: "Do you have a fever?" If so, ask: "What is your temperature, how was it measured, and when did it start?"     None today 6. PUS ON THE TONSILS: "Is there pus on the tonsils in the back of your throat?"     White covering on the back of throat 7. OTHER SYMPTOMS: "Do you have any other symptoms?" (e.g., difficulty breathing, headache, rash)     no 8. PREGNANCY: "Is there any chance you are pregnant?" "When was your last menstrual period?"     No LMP: January  Protocols used: SORE THROAT-A-AH, INFLUENZA EXPOSURE-A-AH

## 2018-09-10 ENCOUNTER — Telehealth: Payer: Self-pay | Admitting: General Practice

## 2018-09-10 MED ORDER — OSELTAMIVIR PHOSPHATE 75 MG PO CAPS: 75.0000 mg | ORAL_CAPSULE | Freq: Every day | ORAL | 0 refills | Status: DC

## 2018-09-10 NOTE — Telephone Encounter (Signed)
Ok for Tamiflu 75mg  1 tab daily x10 days. #10

## 2018-09-10 NOTE — Telephone Encounter (Signed)
Medication filled to pharmacy as requested.  Will call and inform pt.  

## 2018-09-10 NOTE — Addendum Note (Signed)
Addended by: Yvone Neu L on: 09/10/2018 11:13 AM   Modules accepted: Orders

## 2018-09-10 NOTE — Telephone Encounter (Signed)
Received a [employer] message from 09/08/18 that pt sone was diagnosed with Flu  And she was wanting to see if Tamiflu could be sent to her local pharmacy.

## 2018-10-01 ENCOUNTER — Other Ambulatory Visit: Payer: Self-pay | Admitting: Gastroenterology

## 2018-10-01 ENCOUNTER — Other Ambulatory Visit (HOSPITAL_COMMUNITY): Payer: Self-pay | Admitting: Gastroenterology

## 2018-10-01 DIAGNOSIS — R1011 Right upper quadrant pain: Secondary | ICD-10-CM

## 2018-10-01 DIAGNOSIS — R110 Nausea: Secondary | ICD-10-CM

## 2018-11-07 ENCOUNTER — Ambulatory Visit (HOSPITAL_COMMUNITY): Payer: BLUE CROSS/BLUE SHIELD

## 2018-11-07 ENCOUNTER — Other Ambulatory Visit (HOSPITAL_COMMUNITY): Payer: BLUE CROSS/BLUE SHIELD

## 2018-12-13 ENCOUNTER — Ambulatory Visit (HOSPITAL_COMMUNITY): Payer: BLUE CROSS/BLUE SHIELD

## 2018-12-20 ENCOUNTER — Ambulatory Visit (HOSPITAL_COMMUNITY): Payer: BC Managed Care – PPO

## 2018-12-20 ENCOUNTER — Encounter (HOSPITAL_COMMUNITY): Payer: Self-pay

## 2019-01-16 ENCOUNTER — Ambulatory Visit (HOSPITAL_COMMUNITY)
Admission: RE | Admit: 2019-01-16 | Discharge: 2019-01-16 | Disposition: A | Discharge status: Home / Self Care | Payer: BC Managed Care – PPO | Source: Ambulatory Visit | Attending: Gastroenterology | Admitting: Gastroenterology

## 2019-01-16 ENCOUNTER — Other Ambulatory Visit: Payer: Self-pay

## 2019-01-16 DIAGNOSIS — R110 Nausea: Secondary | ICD-10-CM | POA: Diagnosis present

## 2019-01-16 DIAGNOSIS — R1011 Right upper quadrant pain: Secondary | ICD-10-CM

## 2019-01-16 MED ORDER — TECHNETIUM TC 99M MEBROFENIN IV KIT
5.5000 | PACK | Freq: Once | INTRAVENOUS | Status: AC
  Administered 2019-01-16: 5.5 via INTRAVENOUS

## 2019-05-30 ENCOUNTER — Ambulatory Visit (INDEPENDENT_AMBULATORY_CARE_PROVIDER_SITE_OTHER): Payer: BC Managed Care – PPO | Admitting: Family Medicine

## 2019-05-30 ENCOUNTER — Other Ambulatory Visit: Payer: Self-pay

## 2019-05-30 ENCOUNTER — Encounter: Payer: Self-pay | Admitting: Family Medicine

## 2019-05-30 VITALS — BP 121/81 | HR 80 | Temp 98.1°F | Resp 16 | Ht 63.0 in | Wt 151.2 lb

## 2019-05-30 DIAGNOSIS — R5383 Other fatigue: Secondary | ICD-10-CM | POA: Diagnosis not present

## 2019-05-30 LAB — BASIC METABOLIC PANEL
BUN: 12 mg/dL (ref 6–23)
CO2: 25 mEq/L (ref 19–32)
Calcium: 9.2 mg/dL (ref 8.4–10.5)
Chloride: 105 mEq/L (ref 96–112)
Creatinine, Ser: 0.91 mg/dL (ref 0.40–1.20)
GFR: 70.96 mL/min (ref >60.00)
Glucose, Bld: 77 mg/dL (ref 70–99)
Potassium: 4.1 mEq/L (ref 3.5–5.1)
Sodium: 139 mEq/L (ref 135–145)

## 2019-05-30 LAB — CBC WITH DIFFERENTIAL/PLATELET
Basophils Absolute: 0.1 10*3/uL (ref 0.0–0.1)
Basophils Relative: 0.7 % (ref 0.0–3.0)
Eosinophils Absolute: 0.1 10*3/uL (ref 0.0–0.7)
Eosinophils Relative: 1.8 % (ref 0.0–5.0)
HCT: 40.3 % (ref 36.0–46.0)
Hemoglobin: 13.4 g/dL (ref 12.0–15.0)
Lymphocytes Relative: 42.4 % (ref 12.0–46.0)
Lymphs Abs: 3.1 10*3/uL (ref 0.7–4.0)
MCHC: 33.2 g/dL (ref 30.0–36.0)
MCV: 87.3 fl (ref 78.0–100.0)
Monocytes Absolute: 0.7 10*3/uL (ref 0.1–1.0)
Monocytes Relative: 9.4 % (ref 3.0–12.0)
Neutro Abs: 3.4 10*3/uL (ref 1.4–7.7)
Neutrophils Relative %: 45.7 % (ref 43.0–77.0)
Platelets: 272 10*3/uL (ref 150.0–400.0)
RBC: 4.61 Mil/uL (ref 3.87–5.11)
RDW: 12.4 % (ref 11.5–15.5)
WBC: 7.4 10*3/uL (ref 4.0–10.5)

## 2019-05-30 LAB — HEPATIC FUNCTION PANEL
ALT: 12 U/L (ref 0–35)
AST: 14 U/L (ref 0–37)
Albumin: 4.1 g/dL (ref 3.5–5.2)
Alkaline Phosphatase: 48 U/L (ref 39–117)
Bilirubin, Direct: 0.1 mg/dL (ref 0.0–0.3)
Total Bilirubin: 0.5 mg/dL (ref 0.2–1.2)
Total Protein: 6.9 g/dL (ref 6.0–8.3)

## 2019-05-30 LAB — B12 AND FOLATE PANEL
Folate: 18.1 ng/mL (ref >5.9)
Vitamin B-12: 218 pg/mL (ref 211–911)

## 2019-05-30 LAB — TSH: TSH: 1 u[IU]/mL (ref 0.35–4.50)

## 2019-05-30 LAB — VITAMIN D 25 HYDROXY (VIT D DEFICIENCY, FRACTURES): VITD: 32.13 ng/mL (ref 30.00–100.00)

## 2019-05-30 NOTE — Patient Instructions (Signed)
We'll notify you of your lab results and then determine the next steps Make sure you are taking time for yourself!!! Rest when you can! Make sure you have an outlet- whether it's art, music, exercise, journal writing, reading, etc Call with any questions or concerns Stay Safe! Hang in there!!!

## 2019-05-30 NOTE — Progress Notes (Signed)
   Subjective:    Patient ID: Sarah Stokes, female    DOB: 1986/01/10, 33 y.o.   MRN: [id number]  HPI Fatigue- pt reports this has been ongoing since hospitalization last year.  She is sleeping at least 7 hrs/night.  No reports of snoring  Low energy.  Wakes feeling fingers and face are swollen.  Continues to struggle w/ constipation and bloating.  + hair loss.  + brittle nails.  Inability to lose weight despite healthy diet and regular exercise.  + dizziness w/ changes in position.  No menstrual changes.  Denies palpitations.  Increased sensitivity to cold.  + dry skin.  + family hx of thyroid disease.  Has 3 small children under 6.  Due to COVID they have been mostly home since March.  Pt recently quit job.   Review of Systems For ROS see HPI     Objective:   Physical Exam Vitals signs reviewed.  Constitutional:      General: She is not in acute distress.    Appearance: She is well-developed.  HENT:     Head: Normocephalic and atraumatic.  Eyes:     Conjunctiva/sclera: Conjunctivae normal.     Pupils: Pupils are equal, round, and reactive to light.  Neck:     Musculoskeletal: Normal range of motion and neck supple.     Thyroid: No thyromegaly.  Cardiovascular:     Rate and Rhythm: Normal rate and regular rhythm.     Heart sounds: Normal heart sounds. No murmur.  Pulmonary:     Effort: Pulmonary effort is normal. No respiratory distress.     Breath sounds: Normal breath sounds.  Abdominal:     General: There is no distension.     Palpations: Abdomen is soft.     Tenderness: There is no abdominal tenderness.  Lymphadenopathy:     Cervical: No cervical adenopathy.  Skin:    General: Skin is warm and dry.  Neurological:     Mental Status: She is alert and oriented to person, place, and time.  Psychiatric:        Behavior: Behavior normal.           Assessment & Plan:  Fatigue- pt has multiple physical sxs that are suggestive of thyroid disease (hair loss, fatigue,  constipation, dry skin, etc) but we discussed that there may be other causes both metabolic and psychological.  Pt admits to high stress levels w/ 3 young children, COVID, work, [employer] research [employer], government.  If labs are all normal, we talked about the possibility of starting medication for anxiety/depression as this can mimic the physical sxs she has.  Pt understands and is open to the idea but wants to review labs first.  Will follow closely.

## 2019-05-31 ENCOUNTER — Encounter: Payer: Self-pay | Admitting: General Practice

## 2019-06-01 LAB — ANA: Anti Nuclear Antibody (ANA): NEGATIVE

## 2019-08-27 ENCOUNTER — Ambulatory Visit: Payer: BC Managed Care – PPO | Attending: Internal Medicine

## 2019-08-27 DIAGNOSIS — Z20822 Contact with and (suspected) exposure to covid-19: Secondary | ICD-10-CM

## 2019-08-28 LAB — NOVEL CORONAVIRUS, NAA: SARS-CoV-2, NAA: NOT DETECTED

## 2020-04-17 HISTORY — PX: KNEE ARTHROSCOPY W/ MENISCECTOMY: SHX1879

## 2020-04-24 IMAGING — NM NUCLEAR MEDICINE HEPATOBILIARY IMAGING WITH GALLBLADDER EF
2 series · 12 of 12 positions shown · non-contrast
Comparison: None

CLINICAL DATA: RIGHT upper quadrant pain worse with fatty foods,
some nausea and vomiting

EXAM:
NUCLEAR MEDICINE HEPATOBILIARY IMAGING WITH GALLBLADDER EF
TECHNIQUE: Sequential images of the abdomen were obtained [DATE] minutes
following intravenous administration of radiopharmaceutical. After
oral ingestion of Ensure, gallbladder ejection fraction was
determined. At 60 min, normal ejection fraction is greater than 33%.
RADIOPHARMACEUTICALS:  5.5 mCi Zc-SSm  Choletec IV

[Series 1: raw data · 4.46mm/px · 6 of 60 frames shown (1 of 2)]
[frame 6/60]
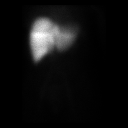
[frame 16/60]
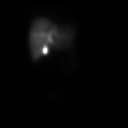
[frame 26/60]
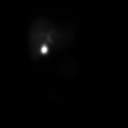
[frame 36/60]
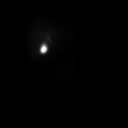
[frame 46/60]
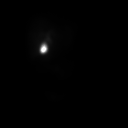
[frame 56/60]
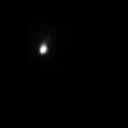

[Series 1: raw data · 4.46mm/px · 6 of 60 frames shown (2 of 2)]
[frame 6/60]
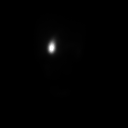
[frame 16/60]
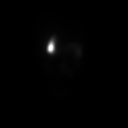
[frame 26/60]
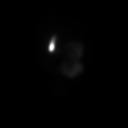
[frame 36/60]
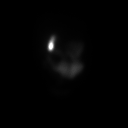
[frame 46/60]
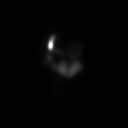
[frame 56/60]
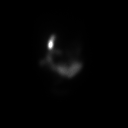

[12 of 12 positions shown; findings below may reference images not displayed]

FINDINGS: Normal tracer extraction from bloodstream indicating normal
hepatocellular function.

Normal excretion of tracer into biliary tree.

Gallbladder visualized at 10 min.

Small bowel visualized at 11 min.

No hepatic retention of tracer.

Subjectively normal emptying of tracer from gallbladder following
fatty meal stimulation.

Calculated gallbladder ejection fraction is 62%, normal.

Patient reported no symptoms following Ensure ingestion.

Normal gallbladder ejection fraction following Ensure ingestion is
greater than 33% at 1 hour.
IMPRESSION: Normal exam.

## 2020-04-24 IMAGING — US ULTRASOUND ABDOMEN COMPLETE
1 series · 14 of 25 positions shown · non-contrast
Comparison: Abdominal ultrasound dated 08/16/2017

CLINICAL DATA: 33-year-old female with right upper quadrant
abdominal pain.

EXAM:
ABDOMEN ULTRASOUND COMPLETE

[Series 1: ultrasound abdomen complete · 14 of 68 slices shown]
[im 1/68]
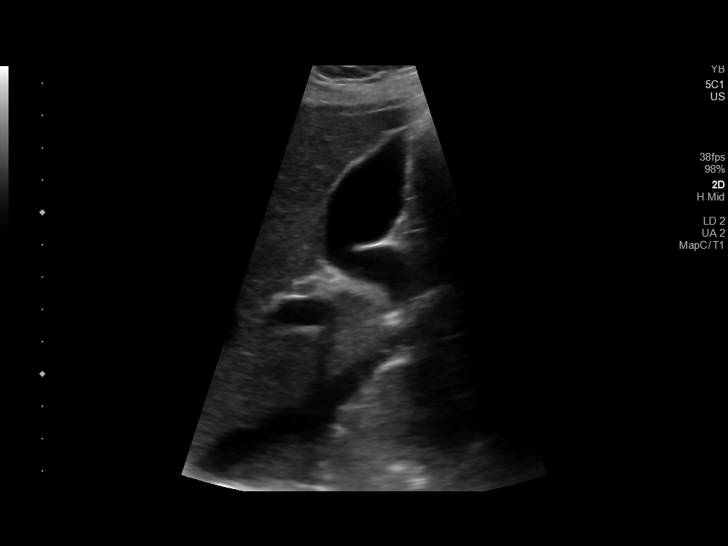
[im 6/68]
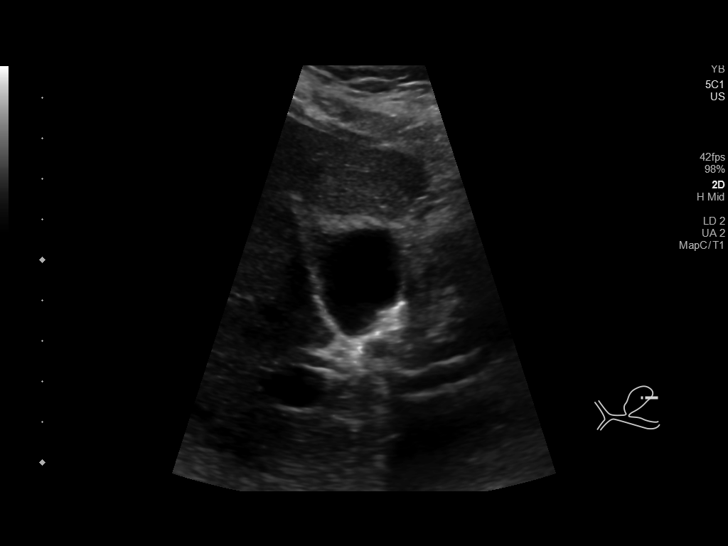
[im 12/68]
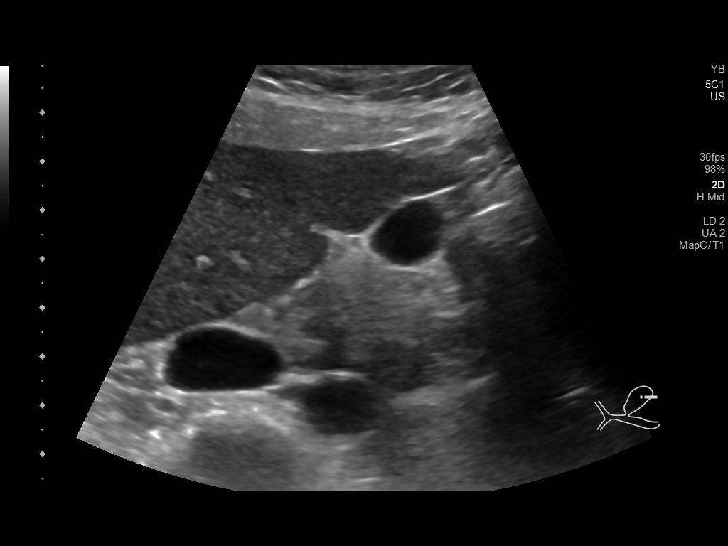
[im 17/68]
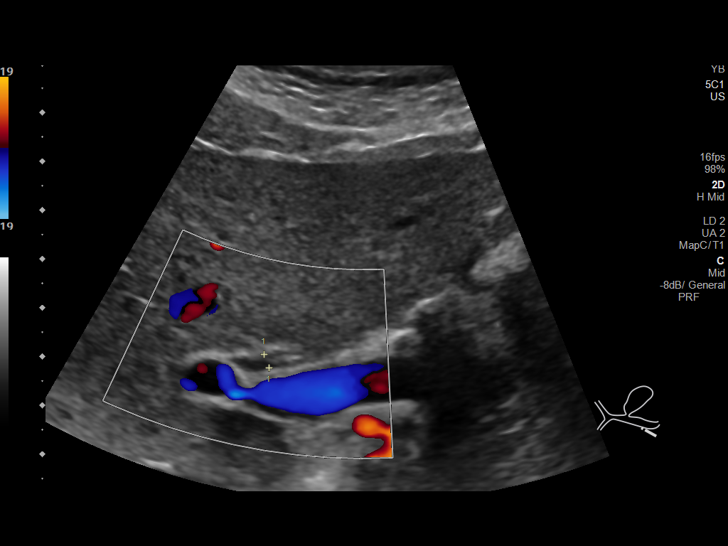
[im 23/68]
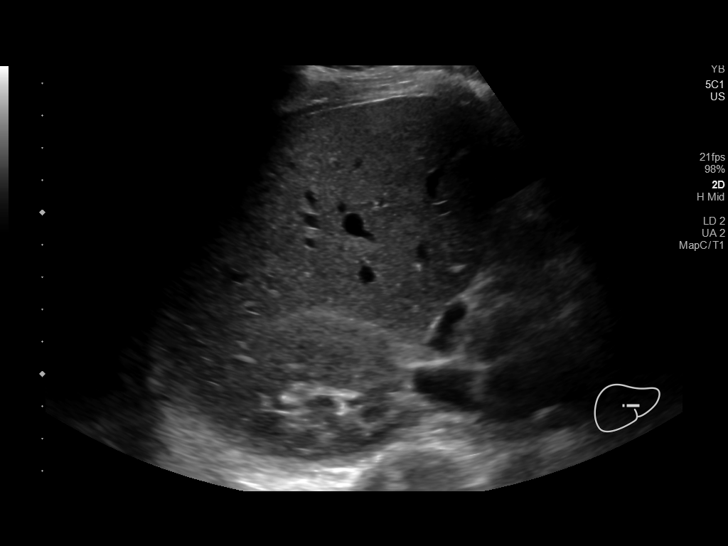
[im 26/68]
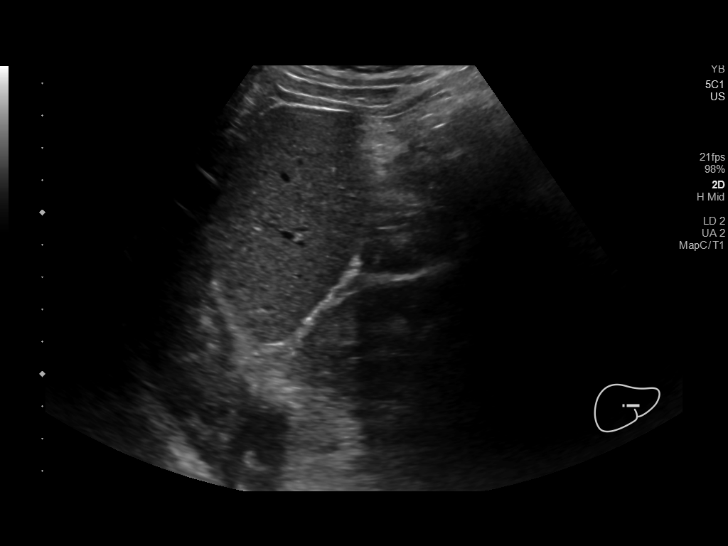
[im 31/68]
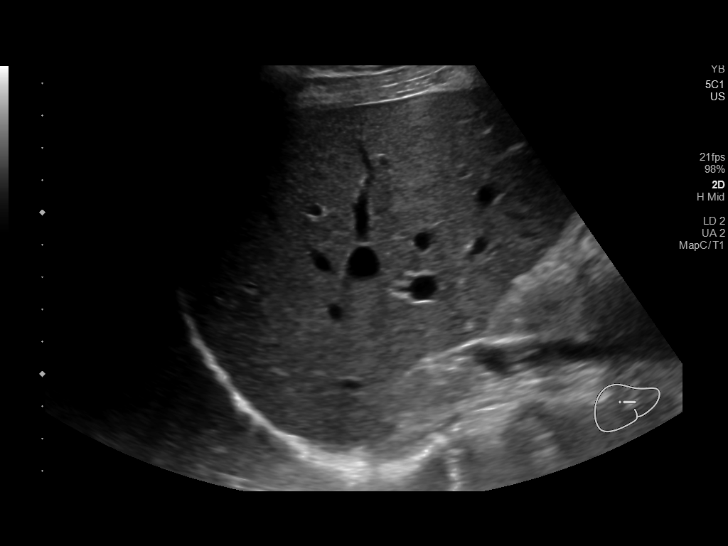
[im 37/68]
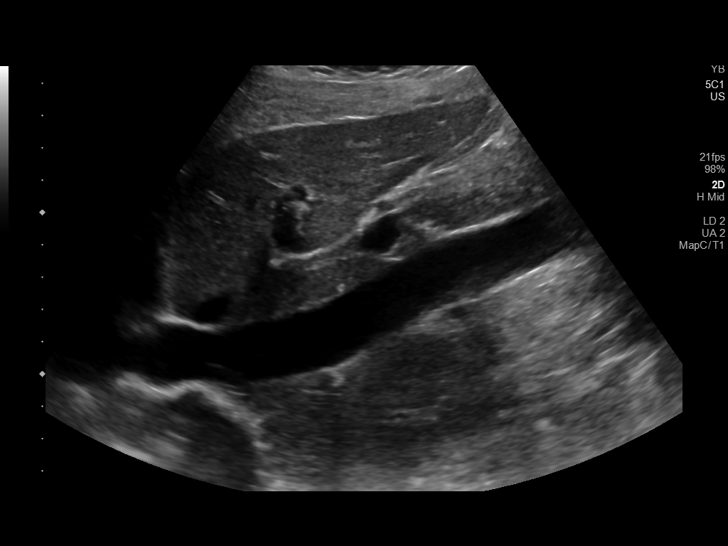
[im 42/68]
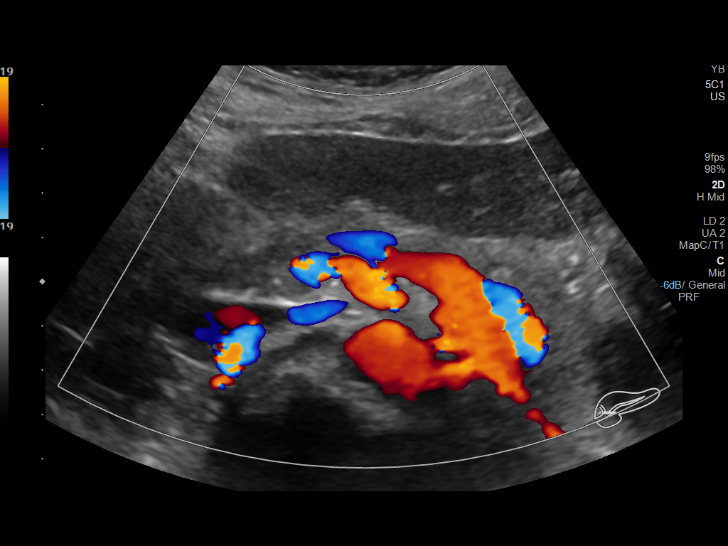
[im 45/68]
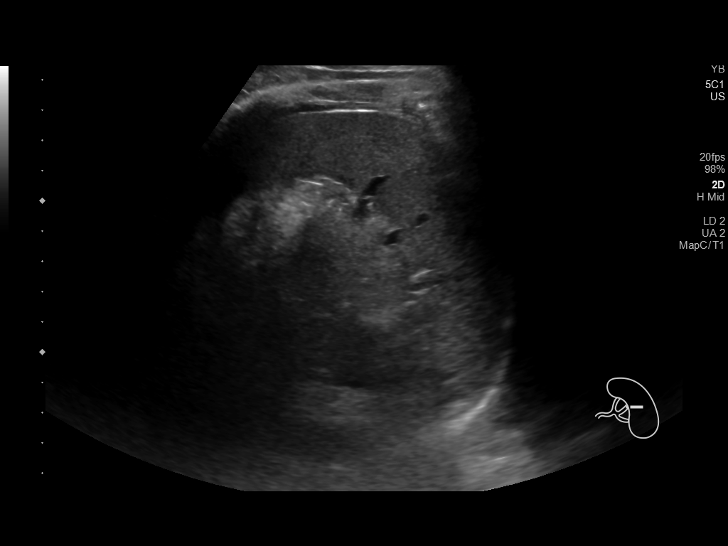
[im 51/68]
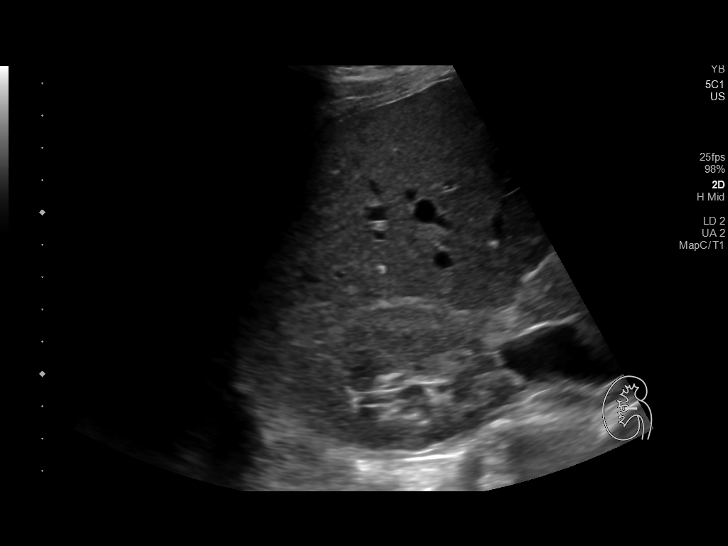
[im 56/68]
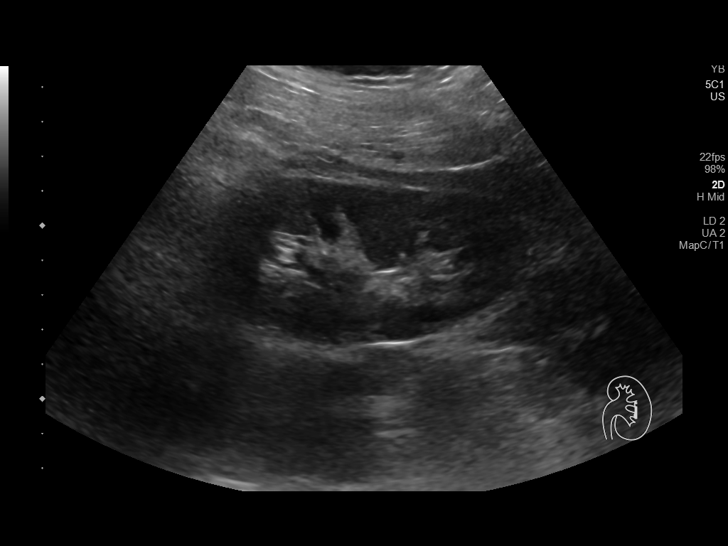
[im 62/68]
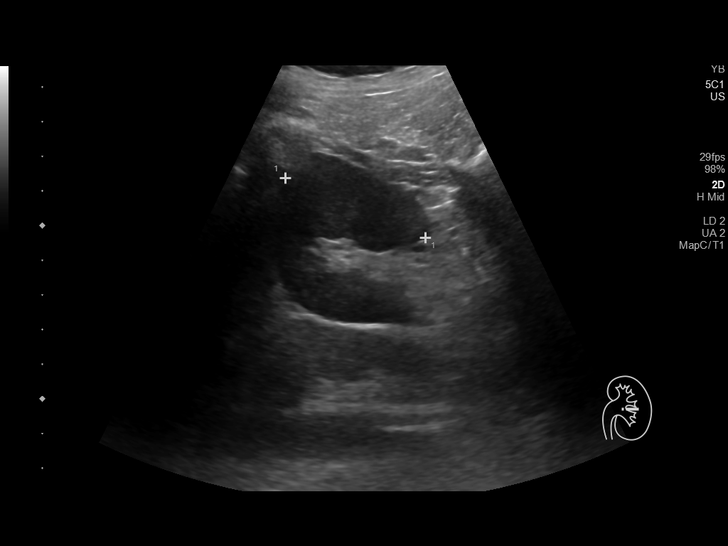
[im 68/68]
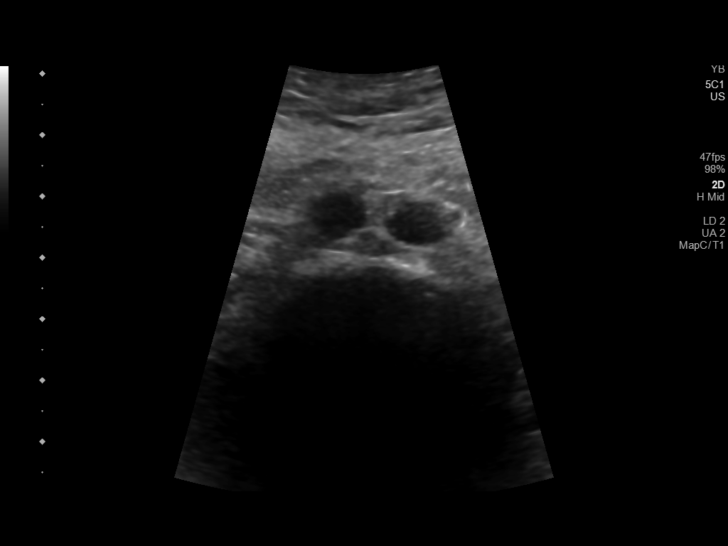

[14 of 25 positions shown; findings below may reference images not displayed]

FINDINGS: Gallbladder: No gallstones or wall thickening visualized. No
sonographic Murphy sign noted by sonographer.

Common bile duct: Diameter: 3 mm

Liver: No focal lesion identified. Within normal limits in
parenchymal echogenicity. Portal vein is patent on color Doppler
imaging with normal direction of blood flow towards the liver.

IVC: No abnormality visualized.

Pancreas: Visualized portion unremarkable.

Spleen: Size and appearance within normal limits.

Right Kidney: Length: 9.5 cm. Echogenicity within normal limits. No
mass or hydronephrosis visualized.

Left Kidney: Length: 9.5 cm. Echogenicity within normal limits. No
mass or hydronephrosis visualized.

Abdominal aorta: No aneurysm visualized.

Other findings: None.
IMPRESSION: Unremarkable abdominal ultrasound.

## 2020-04-25 ENCOUNTER — Telehealth: Payer: Self-pay | Admitting: Emergency Medicine

## 2020-04-25 DIAGNOSIS — T63481A Toxic effect of venom of other arthropod, accidental (unintentional), initial encounter: Secondary | ICD-10-CM

## 2020-04-25 MED ORDER — PREDNISONE 20 MG PO TABS: 20.0000 mg | ORAL_TABLET | Freq: Every day | ORAL | 0 refills | Status: DC

## 2020-04-25 MED ORDER — RESTASIS 0.05 % OP EMUL: 1.0000 [drp] | Freq: Two times a day (BID) | OPHTHALMIC | 0 refills | Status: DC

## 2020-04-25 MED ORDER — PREDNISONE 20 MG PO TABS: 40.0000 mg | ORAL_TABLET | Freq: Every day | ORAL | 0 refills | Status: DC

## 2020-04-25 NOTE — Addendum Note (Signed)
Addended by: Charma Igo on: 04/25/2020 12:44 PM   Modules accepted: Orders

## 2020-04-25 NOTE — Addendum Note (Signed)
Addended by: Liberty Handy on: 04/25/2020 09:50 AM   Modules accepted: Orders

## 2020-04-25 NOTE — Progress Notes (Signed)
Time spent: 10 min   Thank you for describing the insect sting for [place].  Here is how we plan to help!  A sting that we will treat with a short course of prednisone.   Prednisone will help immune system response to insect sting and decrease inflammation (swelling, itching, redness). Please note that peri-orbital cellulitis is a BACTERIAL infection and is not treated with prednisone.  Based on your description, time of insect sting however it does not appear that you have peri-orbital cellulitis and do not need an antibiotic.    Take 600 mg ibuprofen every 6 hours and benadryl every 6 hours for the next 3 days.  Additionally, place cool rag or ice over the affected area.  Keep area clean to prevent secondary bacterial infection of the face, eyelid and eye (cellulitis).   The 2 greatest risks from insect stings are allergic reaction, which can be fatal in some people and infection, which is more common and less serious.  Bees, wasps, yellow jackets, and hornets belong to a class of insects called Hymenoptera.  Most insect stings cause only minor discomfort.  Stings can happen anywhere on the body and can be painful.  Most stings are from honey bees or yellow jackets.  Fire ants can sting multiple times.  The sites of the stings are more likely to become infected.    Provided a home care guide for insect stings and instructions on when to call for help. and I have sent in prednisone 40 mg by mouth daily for 5 days to the pharmacy you selected.  Please make sure that you selected a pharmacy that is open now.  What can be used to prevent Insect Stings?   Insect repellant with at least 20% DEET.    Wearing long pants and shirts with socks and shoes.    Wear dark or drab-colored clothes rather than bright colors.    Avoid using perfumes and hair sprays; these attract insects.  HOME CARE ADVICE:  1. Stinger removal:  The stinger looks like a tiny black dot in the sting.  Use a  fingernail, credit card edge, or knife-edge to scrape it off.  Don't pull it out because it squeezes out more venom.  If the stinger is below the skin surface, leave it alone.  It will be shed with normal skin healing. 2. Use cold compresses to the area of the sting for 10-20 minutes.  You may repeat this as needed to relieve symptoms of pain and swelling. 3.  For pain relief, take acetominophen 650 mg 4-6 hours as needed or ibuprofen 400 mg every 6-8 hours as needed or naproxen 250-500 mg every 12 hours as needed. 4.  You can also use hydrocortisone cream 0.5% or 1% up to 4 times daily as needed for itching. 5.  If the sting becomes very itchy, take Benadryl 25-50 mg, follow directions on box. 6.  Wash the area 2-3 times daily with antibacterial soap and warm water. 7. Call your Doctor if:  Fever, a severe headache, or rash occur in the next 2 weeks.  Sting area begins to look infected.  Redness and swelling worsens after home treatment.  Your current symptoms become worse.  Any involvement or pain in the eye such as blurred vision, pain in the eye, pain with movement of the eye, eye drainage    MAKE SURE YOU:   Understand these instructions.  Will watch your condition.  Will get help right away if you are not  doing well or get worse.  Thank you for choosing an e-visit. Your e-visit answers were reviewed by a board certified advanced clinical practitioner to complete your personal care plan. Depending upon the condition, your plan could have included both over the counter or prescription medications. Please review your pharmacy choice. Be sure that the pharmacy you have chosen is open so that you can pick up your prescription now.  If there is a problem you may message your provider in MyChart to have the prescription routed to another pharmacy. Your safety is important to [place]. If you have drug allergies check your prescription carefully.  For the next 24 hours, you can use MyChart  to ask questions about today's visit, request a non-urgent call back, or ask for a work or school excuse from your e-visit provider. You will get an email in the next two days asking about your experience. I hope that your e-visit has been valuable and will speed your recovery.

## 2020-07-06 ENCOUNTER — Telehealth (INDEPENDENT_AMBULATORY_CARE_PROVIDER_SITE_OTHER): Payer: BC Managed Care – PPO | Admitting: Physician Assistant

## 2020-07-06 ENCOUNTER — Other Ambulatory Visit: Payer: Self-pay

## 2020-07-06 ENCOUNTER — Encounter: Payer: Self-pay | Admitting: Physician Assistant

## 2020-07-06 DIAGNOSIS — J1282 Pneumonia due to coronavirus disease 2019: Secondary | ICD-10-CM | POA: Diagnosis not present

## 2020-07-06 DIAGNOSIS — U071 COVID-19: Secondary | ICD-10-CM | POA: Diagnosis not present

## 2020-07-06 MED ORDER — BENZONATATE 100 MG PO CAPS: 100.0000 mg | ORAL_CAPSULE | Freq: Three times a day (TID) | ORAL | 0 refills | Status: DC | PRN

## 2020-07-06 MED ORDER — DOXYCYCLINE HYCLATE 100 MG PO CAPS
100.0000 mg | ORAL_CAPSULE | Freq: Two times a day (BID) | ORAL | 0 refills | Status: DC
Start: 2020-07-06 — End: 2021-08-16

## 2020-07-06 MED ORDER — ALBUTEROL SULFATE HFA 108 (90 BASE) MCG/ACT IN AERS: 2.0000 | INHALATION_SPRAY | Freq: Four times a day (QID) | RESPIRATORY_TRACT | 0 refills | Status: DC | PRN

## 2020-07-06 NOTE — Patient Instructions (Signed)
Instructions sent to MyChart

## 2020-07-06 NOTE — Progress Notes (Signed)
Virtual Visit via Video   I connected with patient on 07/06/20 at  1:00 PM EST by a video enabled telemedicine application and verified that I am speaking with the correct person using two identifiers.  Location patient: Home Location provider: [facility] Persons participating in the virtual visit: Patient, Provider, CMA (Patina Moore)  I discussed the limitations of evaluation and management by telemedicine and the availability of in person appointments. The patient expressed understanding and agreed to proceed.  Subjective:   HPI:   Patient presents via [facility] today c/o  1 week of URI symptoms starting with sinus pressure, headache, nasal congestion with development of fever on 12/25 with chills, aches and cough that was initially dry. Husband also with the same symptoms. Was tested on 07/05/2020 and tested positive for COVID. Has been keeping well hydrated and taking Ibuprofen for fever, zinc and vitamins; OTC cough medication. Tmax 102.5 today. Resolves with antipyretics. Notes over past 24 hours cough has become productive of dark yellow sputum. Does note some occasional chest tightness but denies overt SOB. Denies chest pain.  ROS:   See pertinent positives and negatives per HPI.  Patient Active Problem List   Diagnosis Date Noted  . Periorbital swelling 01/31/2017  . Rash and nonspecific skin eruption   . Finger swelling   . Neck pain   . Swelling of left eyelid 12/09/2015  . Hypokalemia 12/09/2015  . S/P cesarean section 08/24/2015  . RUQ pain 05/21/2014  . Angular cheilitis 01/31/2014  . Postpartum care following cesarean delivery (10/6) 04/16/2013  . Fetal heart rate decelerations affecting management of mother - induction of labor 04/15/2013  . Status post primary low transverse cesarean section 04/15/2013  . Pregnancy related hip pain in third trimester 04/14/2013  . Sinusitis 07/27/2011  . OTITIS MEDIA, SEROUS, CHRONIC 02/26/2010  .  RAYNAUD'S SYNDROME 02/26/2010  . ANXIETY 06/08/2009  . Attention deficit disorder 01/15/2009  . SINUSITIS- ACUTE-NOS 07/16/2008  . HEADACHE 04/14/2008  . ANTERIOR CRUCIATE LIGAMENT TEAR, LEFT KNEE 01/09/2007    Social History   Tobacco Use  . Smoking status: Never Smoker  . Smokeless tobacco: Never Used  Substance Use Topics  . Alcohol use: No    Current Outpatient Medications:  .  predniSONE (DELTASONE) 20 MG tablet, Take 1 tablet (20 mg total) by mouth daily with breakfast., Disp: 21 tablet, Rfl: 0 .  RESTASIS 0.05 % ophthalmic emulsion, Place 1 drop into both eyes 2 (two) times daily., Disp: 0.4 mL, Rfl: 0  Allergies  Allergen Reactions  . Ambien [Zolpidem Tartrate]     Hallucinations  . Clindamycin Swelling    GI Intolerance  . Percocet [Oxycodone-Acetaminophen] Hives and Nausea And Vomiting  . Vicodin [Hydrocodone-Acetaminophen] Hives and Nausea And Vomiting  . Adhesive [Tape] Rash    Tape from epidural     Objective:   There were no vitals taken for this visit.  Patient is well-developed, well-nourished in no acute distress.  Resting comfortably at home.  Head is normocephalic, atraumatic.  No labored breathing.  Speech is clear and coherent with logical content.  Patient is alert and oriented at baseline.   Assessment and Plan:   1. COVID-19 2. Pneumonia due to COVID-19 virus Covid positive, now with change in sputum coloration and increased thickness of mucus.  Concern for secondary bronchitis versus Covid pneumonia.  Patient with low-grade fever control with antipyretics.  No active shortness of breath.  As such she is stable to continue outpatient treatment.  We will  send her information to infusion center to see if she qualifies for monoclonal antibody infusion giving history of abnormal echo.  Patient enrolled in Covid symptom monitoring program.  Continue vitamin supplementation, hydration and rest.  Will Rx Tessalon and albuterol to use as directed.  We  will add on doxycycline 100 mg twice daily x7 days.  Strict ER precautions discussed with patient who voiced understanding and agreement with the plan. - doxycycline (VIBRAMYCIN) 100 MG capsule; Take 1 capsule (100 mg total) by mouth 2 (two) times daily.  Dispense: 14 capsule; Refill: 0 - albuterol (VENTOLIN HFA) 108 (90 Base) MCG/ACT inhaler; Inhale 2 puffs into the lungs every 6 (six) hours as needed for wheezing or shortness of breath.  Dispense: 8 g; Refill: 0 - benzonatate (TESSALON) 100 MG capsule; Take 1 capsule (100 mg total) by mouth 3 (three) times daily as needed for cough.  Dispense: 30 capsule; Refill: 0    Piedad Climes, [place] 07/06/2020

## 2021-01-06 ENCOUNTER — Encounter: Payer: Self-pay | Admitting: *Deleted

## 2021-05-20 ENCOUNTER — Telehealth: Payer: BC Managed Care – PPO | Admitting: Physician Assistant

## 2021-05-20 DIAGNOSIS — B379 Candidiasis, unspecified: Secondary | ICD-10-CM | POA: Diagnosis not present

## 2021-05-20 DIAGNOSIS — B9689 Other specified bacterial agents as the cause of diseases classified elsewhere: Secondary | ICD-10-CM | POA: Diagnosis not present

## 2021-05-20 DIAGNOSIS — J019 Acute sinusitis, unspecified: Secondary | ICD-10-CM

## 2021-05-20 DIAGNOSIS — T3695XA Adverse effect of unspecified systemic antibiotic, initial encounter: Secondary | ICD-10-CM | POA: Diagnosis not present

## 2021-05-20 MED ORDER — FLUCONAZOLE 150 MG PO TABS
150.0000 mg | ORAL_TABLET | Freq: Once | ORAL | 0 refills | Status: AC
Start: 2021-05-20 — End: 2021-05-20

## 2021-05-20 MED ORDER — AMOXICILLIN-POT CLAVULANATE 875-125 MG PO TABS: 1.0000 | ORAL_TABLET | Freq: Two times a day (BID) | ORAL | 0 refills | Status: DC

## 2021-05-20 NOTE — Progress Notes (Signed)
Virtual Visit Consent   Sarah Stokes, you are scheduled for a virtual visit with a [facility] provider today.     Just as with appointments in the office, your consent must be obtained to participate.  Your consent will be active for this visit and any virtual visit you may have with one of our providers in the next 365 days.     If you have a MyChart account, a copy of this consent can be sent to you electronically.  All virtual visits are billed to your insurance company just like a traditional visit in the office.    As this is a virtual visit, video technology does not allow for your provider to perform a traditional examination.  This may limit your provider's ability to fully assess your condition.  If your provider identifies any concerns that need to be evaluated in person or the need to arrange testing (such as labs, EKG, etc.), we will make arrangements to do so.     Although advances in technology are sophisticated, we cannot ensure that it will always work on either your end or our end.  If the connection with a video visit is poor, the visit may have to be switched to a telephone visit.  With either a video or telephone visit, we are not always able to ensure that we have a secure connection.     I need to obtain your verbal consent now.   Are you willing to proceed with your visit today?    Adalee Kathan has provided verbal consent on 05/20/2021 for a virtual visit (video or telephone).   Margaretann Loveless, PA-C   Date: 05/20/2021 4:25 PM   Virtual Visit via Video Note   I, Margaretann Loveless, connected with  Sarah Stokes  ([id number], 03-21-1986) on 05/20/21 at  4:15 PM EST by a video-enabled telemedicine application and verified that I am speaking with the correct person using two identifiers.  Location: Patient: Virtual Visit Location Patient: Home Provider: Virtual Visit Location Provider: Home Office   I discussed the limitations of evaluation and  management by telemedicine and the availability of in person appointments. The patient expressed understanding and agreed to proceed.    History of Present Illness: Sarah Stokes is a 35 y.o. who identifies as a female who was assigned female at birth, and is being seen today for sinus congestion.  HPI: Sinusitis This is a new problem. The current episode started 1 to 4 weeks ago (congestion started 2 weeks ago and worsened this week). The problem has been gradually worsening since onset. There has been no fever. She is experiencing no pain. Associated symptoms include chills, congestion, ear pain (pressure), headaches, sinus pressure and a sore throat. Pertinent negatives include no coughing or shortness of breath. Treatments tried: steam, zclear nasal spray, sudafed. The treatment provided no relief.     Problems:  Patient Active Problem List   Diagnosis Date Noted   Periorbital swelling 01/31/2017   Rash and nonspecific skin eruption    Finger swelling    Neck pain    Swelling of left eyelid 12/09/2015   Hypokalemia 12/09/2015   S/P cesarean section 08/24/2015   RUQ pain 05/21/2014   Angular cheilitis 01/31/2014   Postpartum care following cesarean delivery (10/6) 04/16/2013   Fetal heart rate decelerations affecting management of mother - induction of labor 04/15/2013   Status post primary low transverse cesarean section 04/15/2013   Pregnancy related hip pain in third trimester 04/14/2013  Sinusitis 07/27/2011   OTITIS MEDIA, SEROUS, CHRONIC 02/26/2010   RAYNAUD'S SYNDROME 02/26/2010   ANXIETY 06/08/2009   Attention deficit disorder 01/15/2009   SINUSITIS- ACUTE-NOS 07/16/2008   HEADACHE 04/14/2008   ANTERIOR CRUCIATE LIGAMENT TEAR, LEFT KNEE 01/09/2007    Allergies:  Allergies  Allergen Reactions   Ambien [Zolpidem Tartrate]     Hallucinations   Clindamycin Swelling    GI Intolerance   Percocet [Oxycodone-Acetaminophen] Hives and Nausea And Vomiting   Vicodin  [Hydrocodone-Acetaminophen] Hives and Nausea And Vomiting   Adhesive [Tape] Rash    Tape from epidural    Medications:  Current Outpatient Medications:    amoxicillin-clavulanate (AUGMENTIN) 875-125 MG tablet, Take 1 tablet by mouth 2 (two) times daily., Disp: 20 tablet, Rfl: 0   fluconazole (DIFLUCAN) 150 MG tablet, Take 1 tablet (150 mg total) by mouth once for 1 dose., Disp: 1 tablet, Rfl: 0   albuterol (VENTOLIN HFA) 108 (90 Base) MCG/ACT inhaler, Inhale 2 puffs into the lungs every 6 (six) hours as needed for wheezing or shortness of breath., Disp: 8 g, Rfl: 0   benzonatate (TESSALON) 100 MG capsule, Take 1 capsule (100 mg total) by mouth 3 (three) times daily as needed for cough., Disp: 30 capsule, Rfl: 0   doxycycline (VIBRAMYCIN) 100 MG capsule, Take 1 capsule (100 mg total) by mouth 2 (two) times daily., Disp: 14 capsule, Rfl: 0   predniSONE (DELTASONE) 20 MG tablet, Take 1 tablet (20 mg total) by mouth daily with breakfast., Disp: 21 tablet, Rfl: 0   RESTASIS 0.05 % ophthalmic emulsion, Place 1 drop into both eyes 2 (two) times daily., Disp: 0.4 mL, Rfl: 0  Observations/Objective: Patient is well-developed, well-nourished in no acute distress.  Resting comfortably at home.  Head is normocephalic, atraumatic.  No labored breathing. Speech is clear and coherent with logical content.  Patient is alert and oriented at baseline.    Assessment and Plan: 1. Acute bacterial sinusitis - amoxicillin-clavulanate (AUGMENTIN) 875-125 MG tablet; Take 1 tablet by mouth 2 (two) times daily.  Dispense: 20 tablet; Refill: 0  2. Antibiotic-induced yeast infection - fluconazole (DIFLUCAN) 150 MG tablet; Take 1 tablet (150 mg total) by mouth once for 1 dose.  Dispense: 1 tablet; Refill: 0  - Worsening symptoms that have not responded to OTC medications.  - Will give augmentin  - Continue allergy medications.  - Gets yeast infections with antibiotics. Diflucan given  - Stay well hydrated and  get plenty of rest.  - Call or seek in person evaluation if no symptom improvement or if symptoms worsen.   Follow Up Instructions: I discussed the assessment and treatment plan with the patient. The patient was provided an opportunity to ask questions and all were answered. The patient agreed with the plan and demonstrated an understanding of the instructions.  A copy of instructions were sent to the patient via MyChart unless otherwise noted below.    The patient was advised to call back or seek an in-person evaluation if the symptoms worsen or if the condition fails to improve as anticipated.  Time:  I spent 10 minutes with the patient via telehealth technology discussing the above problems/concerns.    Margaretann Loveless, PA-C

## 2021-05-20 NOTE — Patient Instructions (Signed)
Sarah Stokes, thank you for joining Margaretann Loveless, PA-C for today's virtual visit.  While this provider is not your primary care provider (PCP), if your PCP is located in our provider database this encounter information will be shared with them immediately following your visit.  Consent: (Patient) Sarah Stokes provided verbal consent for this virtual visit at the beginning of the encounter.  Current Medications:  Current Outpatient Medications:    amoxicillin-clavulanate (AUGMENTIN) 875-125 MG tablet, Take 1 tablet by mouth 2 (two) times daily., Disp: 20 tablet, Rfl: 0   fluconazole (DIFLUCAN) 150 MG tablet, Take 1 tablet (150 mg total) by mouth once for 1 dose., Disp: 1 tablet, Rfl: 0   albuterol (VENTOLIN HFA) 108 (90 Base) MCG/ACT inhaler, Inhale 2 puffs into the lungs every 6 (six) hours as needed for wheezing or shortness of breath., Disp: 8 g, Rfl: 0   benzonatate (TESSALON) 100 MG capsule, Take 1 capsule (100 mg total) by mouth 3 (three) times daily as needed for cough., Disp: 30 capsule, Rfl: 0   doxycycline (VIBRAMYCIN) 100 MG capsule, Take 1 capsule (100 mg total) by mouth 2 (two) times daily., Disp: 14 capsule, Rfl: 0   predniSONE (DELTASONE) 20 MG tablet, Take 1 tablet (20 mg total) by mouth daily with breakfast., Disp: 21 tablet, Rfl: 0   RESTASIS 0.05 % ophthalmic emulsion, Place 1 drop into both eyes 2 (two) times daily., Disp: 0.4 mL, Rfl: 0   Medications ordered in this encounter:  Meds ordered this encounter  Medications   amoxicillin-clavulanate (AUGMENTIN) 875-125 MG tablet    Sig: Take 1 tablet by mouth 2 (two) times daily.    Dispense:  20 tablet    Refill:  0    Order Specific Question:   Supervising Provider    Answer:   MILLER, BRIAN [[id number]]   fluconazole (DIFLUCAN) 150 MG tablet    Sig: Take 1 tablet (150 mg total) by mouth once for 1 dose.    Dispense:  1 tablet    Refill:  0    Order Specific Question:   Supervising Provider    Answer:    Hyacinth Meeker, BRIAN [[id number]]     *If you need refills on other medications prior to your next appointment, please contact your pharmacy*  Follow-Up: Call back or seek an in-person evaluation if the symptoms worsen or if the condition fails to improve as anticipated.  Other Instructions Sinusitis, Adult Sinusitis is inflammation of your sinuses. Sinuses are hollow spaces in the bones around your face. Your sinuses are located: Around your eyes. In the middle of your forehead. Behind your nose. In your cheekbones. Mucus normally drains out of your sinuses. When your nasal tissues become inflamed or swollen, mucus can become trapped or blocked. This allows bacteria, viruses, and fungi to grow, which leads to infection. Most infections of the sinuses are caused by a virus. Sinusitis can develop quickly. It can last for up to 4 weeks (acute) or for more than 12 weeks (chronic). Sinusitis often develops after a cold. What are the causes? This condition is caused by anything that creates swelling in the sinuses or stops mucus from draining. This includes: Allergies. Asthma. Infection from bacteria or viruses. Deformities or blockages in your nose or sinuses. Abnormal growths in the nose (nasal polyps). Pollutants, such as chemicals or irritants in the air. Infection from fungi (rare). What increases the risk? You are more likely to develop this condition if you: Have a weak body defense system (immune system).  Do a lot of swimming or diving. Overuse nasal sprays. Smoke. What are the signs or symptoms? The main symptoms of this condition are pain and a feeling of pressure around the affected sinuses. Other symptoms include: Stuffy nose or congestion. Thick drainage from your nose. Swelling and warmth over the affected sinuses. Headache. Upper toothache. A cough that may get worse at night. Extra mucus that collects in the throat or the back of the nose (postnasal drip). Decreased sense of  smell and taste. Fatigue. A fever. Sore throat. Bad breath. How is this diagnosed? This condition is diagnosed based on: Your symptoms. Your medical history. A physical exam. Tests to find out if your condition is acute or chronic. This may include: Checking your nose for nasal polyps. Viewing your sinuses using a device that has a light (endoscope). Testing for allergies or bacteria. Imaging tests, such as an MRI or CT scan. In rare cases, a bone biopsy may be done to rule out more serious types of fungal sinus disease. How is this treated? Treatment for sinusitis depends on the cause and whether your condition is chronic or acute. If caused by a virus, your symptoms should go away on their own within 10 days. You may be given medicines to relieve symptoms. They include: Medicines that shrink swollen nasal passages (topical intranasal decongestants). Medicines that treat allergies (antihistamines). A spray that eases inflammation of the nostrils (topical intranasal corticosteroids). Rinses that help get rid of thick mucus in your nose (nasal saline washes). If caused by bacteria, your health care provider may recommend waiting to see if your symptoms improve. Most bacterial infections will get better without antibiotic medicine. You may be given antibiotics if you have: A severe infection. A weak immune system. If caused by narrow nasal passages or nasal polyps, you may need to have surgery. Follow these instructions at home: Medicines Take, use, or apply over-the-counter and prescription medicines only as told by your health care provider. These may include nasal sprays. If you were prescribed an antibiotic medicine, take it as told by your health care provider. Do not stop taking the antibiotic even if you start to feel better. Hydrate and humidify  Drink enough fluid to keep your urine pale yellow. Staying hydrated will help to thin your mucus. Use a cool mist humidifier to keep  the humidity level in your home above 50%. Inhale steam for 10-15 minutes, 3-4 times a day, or as told by your health care provider. You can do this in the bathroom while a hot shower is running. Limit your exposure to cool or dry air. Rest Rest as much as possible. Sleep with your head raised (elevated). Make sure you get enough sleep each night. General instructions  Apply a warm, moist washcloth to your face 3-4 times a day or as told by your health care provider. This will help with discomfort. Wash your hands often with soap and water to reduce your exposure to germs. If soap and water are not available, use hand sanitizer. Do not smoke. Avoid being around people who are smoking (secondhand smoke). Keep all follow-up visits as told by your health care provider. This is important. Contact a health care provider if: You have a fever. Your symptoms get worse. Your symptoms do not improve within 10 days. Get help right away if: You have a severe headache. You have persistent vomiting. You have severe pain or swelling around your face or eyes. You have vision problems. You develop confusion. Your neck  is stiff. You have trouble breathing. Summary Sinusitis is soreness and inflammation of your sinuses. Sinuses are hollow spaces in the bones around your face. This condition is caused by nasal tissues that become inflamed or swollen. The swelling traps or blocks the flow of mucus. This allows bacteria, viruses, and fungi to grow, which leads to infection. If you were prescribed an antibiotic medicine, take it as told by your health care provider. Do not stop taking the antibiotic even if you start to feel better. Keep all follow-up visits as told by your health care provider. This is important. This information is not intended to replace advice given to you by your health care provider. Make sure you discuss any questions you have with your health care provider. Document Revised:  11/27/2017 Document Reviewed: 11/27/2017 Elsevier Patient Education  2022 [employer].    If you have been instructed to have an in-person evaluation today at a local Urgent Care facility, please use the link below. It will take you to a list of all of our available [facility], including address, phone number and hours of operation. Please do not delay care.  [facility]  If you or a family member do not have a primary care provider, use the link below to schedule a visit and establish care. When you choose a [facility] primary care physician or advanced practice provider, you gain a long-term partner in health. Find a Primary Care Provider  Learn more about [facility]'s in-office and virtual care options: [facility] - Get Care Now

## 2021-07-29 ENCOUNTER — Telehealth: Payer: Self-pay | Admitting: Family Medicine

## 2021-07-29 DIAGNOSIS — Q2112 Patent foramen ovale: Secondary | ICD-10-CM

## 2021-07-29 NOTE — Telephone Encounter (Signed)
Pt called in stating that she was to have foot sugary next week. The provider saw that she had a PFO from 2017 and want pt to see a cardiologist for a surgical clearance. She would like to see Dr. Chilton Si at [facility]   Please advise

## 2021-07-29 NOTE — Telephone Encounter (Signed)
Referral placed.

## 2021-07-29 NOTE — Telephone Encounter (Signed)
Pt needs cardiology clearance prior to Surgery on foot, are you comfortable sending in referral or would you like to see patient prior to ordering this?

## 2021-07-30 ENCOUNTER — Encounter: Payer: Self-pay | Admitting: Family Medicine

## 2021-08-13 NOTE — Progress Notes (Signed)
Cardiology Office Note:    Date:  08/16/2021   ID:  Sarah Stokes, DOB 1986/02/11, MRN [id number]  PCP:  Midge Minium, MD  Cardiologist:  Buford Dresser, MD  Referring MD: Midge Minium, MD   CC: new patient evaluation for reported history of PFO  History of Present Illness:    Sarah Stokes is a 36 y.o. female with a hx of Raynaud's syndrome, who is seen as a new consult at the request of Sarah Stokes, Sarah Millet, MD for the evaluation and management of patent foramen ovale.  On 07/29/2021 Sarah Stokes requested a referral to cardiology as she is in need of surgical clearance for surgery on her fractured foot. She noted that her PFO was initially found from an ultrasound 12/2015. However, I personally reviewed her images and report from that exam, and there is no definitive PFO noted. She was told at a preoperative anesthesia evaluation that she has a PFO.  I electronically searched her records. There is mention at her anesthesia note from 04/17/20 of PFO, PDA. When I reviewed her records, this appears to be from a fetal echo in 2014 (in which case PFO and PDA would be expected).  Planned surgery: Right foot surgery indicated by fracture  Pertinent past cardiac history: As a child she was told that she may have a heart murmur. Prior cardiac workup: echo 2017 reviewed History of valve disease: none History of CAD/PAD/CVA/TIA: none History of heart failure: none History of arrhythmia: none On anticoagulation: no History of hypertension: no History of diabetes: no History of CKD: no History of OSA: no History of anesthesia complications: none Current symptoms: None. Previously she had episodes of quick flutters or her heart skipping a beat. Functional capacity: Frequent exercise, including walking and lifting weights.  Cardiovascular risk factors: Tobacco use history: Never. Alcohol: Very rarely. Family history: No known heart attacks that she knows of. Her  maternal grandmother had several strokes in her 15s. Prior cardiac testing and/or incidental findings on other testing (ie coronary calcium): She reports that during a knee surgery in 2021, she was told she had a PFO found in an ultrasound 2017. In 2017 she was hospitalized for peri-orbital cellulitis.  Exercise level: Frequent, including walking and lifting weights. Current diet: Tries to eat healthy, including fruits, vegetables, and protein.  Previously she had episodes of quick flutters or her heart skipping a beat.  She denies any chest pain, or shortness of breath. No lightheadedness, headaches, syncope, orthopnea, PND, lower extremity edema or exertional symptoms.  Past Medical History:  Diagnosis Date   Anxiety    History of Raynaud's syndrome    Hx of varicella    Migraine 2006   Periorbital cellulitis    Postpartum care following cesarean delivery (10/6) 04/16/2013   Status post primary low transverse cesarean section 04/15/2013    Past Surgical History:  Procedure Laterality Date   CESAREAN SECTION N/A 04/15/2013   Procedure: CESAREAN SECTION;  Surgeon: Sarah Royals, MD;  Location: [facility];  Service: Obstetrics;  Laterality: N/A;   CESAREAN SECTION N/A 08/24/2015   Procedure: CESAREAN SECTION;  Surgeon: Sarah Queen, MD;  Location: [facility];  Service: Obstetrics;  Laterality: N/A;  REPEAT EDC 08/28/15    CESAREAN SECTION N/A 01/16/2018   Procedure: REPEAT CESAREAN SECTION;  Surgeon: Sarah Queen, MD;  Location: [facility];  Service: Obstetrics;  Laterality: N/A;  Repeat edc 01/21/18 allergy to vicodin, percocet, Dimas Millin, RNFA   HIP SURGERY  took bone for knee   KNEE SURGERY     left x6, Dr. Percell Stokes   labial repair  2006   TONSILLECTOMY      Current Medications: No current outpatient medications on file prior to visit.   No current facility-administered medications on file prior to visit.     Allergies:   Ambien [zolpidem tartrate],  Clindamycin, Percocet [oxycodone-acetaminophen], Vicodin [hydrocodone-acetaminophen], and Adhesive [tape]   Social History   Tobacco Use   Smoking status: Never   Smokeless tobacco: Never  Vaping Use   Vaping Use: Never used  Substance Use Topics   Alcohol use: No   Drug use: No    Family History: family history includes Allergic rhinitis in her brother, father, mother, and sister; Cancer in her mother; Diabetes in her maternal aunt and maternal uncle; Thyroid disease in her mother and paternal grandmother.  ROS:   Please see the history of present illness.  Additional pertinent ROS: Constitutional: Negative for chills, fever, night sweats, unintentional weight loss  HENT: Negative for ear pain and hearing loss.   Eyes: Negative for loss of vision and eye pain.  Respiratory: Negative for cough, sputum, wheezing.   Cardiovascular: See HPI. Gastrointestinal: Negative for abdominal pain, melena, and hematochezia.  Genitourinary: Negative for dysuria and hematuria.  Musculoskeletal: Negative for falls and myalgias.  Skin: Negative for itching and rash.  Neurological: Negative for focal weakness, focal sensory changes and loss of consciousness.  Endo/Heme/Allergies: Does not bruise/bleed easily.     EKGs/Labs/Other Studies Reviewed:    The following studies were reviewed today:  Echo 12/12/2015: Study Conclusions   - Left ventricle: The cavity size was normal. Systolic function was    normal. The estimated ejection fraction was in the range of 55%    to 60%. There is akinesis of the basalinferior myocardium. The    transmitral flow pattern was normal. Left ventricular diastolic    function parameters were normal.  - Tricuspid valve: There was trivial regurgitation.  - Pulmonic valve: There was trivial regurgitation.   EKG:  EKG is personally reviewed.   08/16/2021: sinus rhythm with sinus arrhythmia at 63 bpm (normal)  Recent Labs: No results found for requested labs within  last 8760 hours.   Recent Lipid Panel    Component Value Date/Time   CHOL 182 07/27/2011 0835   TRIG 92.0 07/27/2011 0835   HDL 52.70 07/27/2011 0835   CHOLHDL 3 07/27/2011 0835   VLDL 18.4 07/27/2011 0835   LDLCALC 111 (H) 07/27/2011 0835    Physical Exam:    VS:  BP 92/62    Pulse 63    Ht 5\' 3"  (1.6 m)    Wt 151 lb (68.5 kg)    SpO2 98%    BMI 26.75 kg/m     Wt Readings from Last 3 Encounters:  08/16/21 151 lb (68.5 kg)  05/30/19 151 lb 4 oz (68.6 kg)  08/20/18 147 lb (66.7 kg)    GEN: Well nourished, well developed in no acute distress HEENT: Normal, moist mucous membranes NECK: No JVD CARDIAC: regular rhythm, normal S1 and S2, no rubs or gallops. No murmur. VASCULAR: Radial and DP pulses 2+ bilaterally. No carotid bruits RESPIRATORY:  Clear to auscultation without rales, wheezing or rhonchi  ABDOMEN: Soft, non-tender, non-distended MUSCULOSKELETAL:  Ambulates independently SKIN: Warm and dry, no edema NEUROLOGIC:  Alert and oriented x 3. No focal neuro deficits noted. PSYCHIATRIC:  Normal affect    ASSESSMENT:    1. Pre-op evaluation  2. Cardiac risk counseling   3. Counseling on health promotion and disease prevention   4. Encounter to discuss test results    PLAN:    Preoperative evaluation -she reports being told that she has a PFO based on her echocardiogram in 2017. I personally reviewed both the report and the images. I do not see a definitive PFO -we did discuss PFO in general, including that it occurs in 25-30% of the population and does not require treatment except in cryptogenic stroke or significant shunting -my suspicion is that the anesthesia service reviewed her FETAL echo from 2014, which noted PFO and PDA (both normal findings in a fetus) and thought that it was her echo -she is low risk from a cardiology perspective and does not require any additional testing  Cardiac risk counseling and prevention recommendations: -recommend heart  healthy/Mediterranean diet, with whole grains, fruits, vegetable, fish, lean meats, nuts, and olive oil. Limit salt. -recommend moderate walking, 3-5 times/week for 30-50 minutes each session. Aim for at least 150 minutes.week. Goal should be pace of 3 miles/hours, or walking 1.5 miles in 30 minutes -recommend avoidance of tobacco products. Avoid excess alcohol. -ASCVD risk score: The ASCVD Risk score (Arnett DK, et al., 2019) failed to calculate for the following reasons:   The 2019 ASCVD risk score is only valid for ages 50 to 74    Plan for follow up: PRN.  Buford Dresser, MD, PhD, [facility]    Medication Adjustments/Labs and Tests Ordered: Current medicines are reviewed at length with the patient today.  Concerns regarding medicines are outlined above.   Orders Placed This Encounter  Procedures   EKG 12-Lead   No orders of the defined types were placed in this encounter.  Patient Instructions  Medication Instructions:  Your Physician recommend you continue on your current medication as directed.    *If you need a refill on your cardiac medications before your next appointment, please call your pharmacy*   Lab Work: None ordered today   Testing/Procedures: None ordered today   Follow-Up: At [facility], you and your health needs are our priority.  As part of our continuing mission to provide you with exceptional heart care, we have created designated Provider Care Teams.  These Care Teams include your primary Cardiologist (physician) and Advanced Practice Providers (APPs -  Physician Assistants and Nurse Practitioners) who all work together to provide you with the care you need, when you need it.  We recommend signing up for the patient portal called "MyChart".  Sign up information is provided on this After Visit Summary.  MyChart is used to connect with patients for Virtual Visits (Telemedicine).  Patients are able to view lab/test results,  encounter notes, upcoming appointments, etc.  Non-urgent messages can be sent to your provider as well.   To learn more about what you can do with MyChart, go to [email or web address].    Your next appointment:   As needed  The format for your next appointment:   In Person  Provider:   Buford Dresser, MD       [facility],acting as a scribe for Buford Dresser, MD.,have documented all relevant documentation on the behalf of Buford Dresser, MD,as directed by  Buford Dresser, MD while in the presence of Buford Dresser, MD.  I, Buford Dresser, MD, have reviewed all documentation for this visit. The documentation on 08/16/21 for the exam, diagnosis, procedures, and orders are all accurate and complete.   Signed, Charman Blasco  Harrell Gave, MD PhD 08/16/2021 1:08 PM    [place]

## 2021-08-16 ENCOUNTER — Other Ambulatory Visit: Payer: Self-pay

## 2021-08-16 ENCOUNTER — Ambulatory Visit (INDEPENDENT_AMBULATORY_CARE_PROVIDER_SITE_OTHER): Payer: BC Managed Care – PPO | Admitting: Cardiology

## 2021-08-16 ENCOUNTER — Encounter (HOSPITAL_BASED_OUTPATIENT_CLINIC_OR_DEPARTMENT_OTHER): Payer: Self-pay | Admitting: Cardiology

## 2021-08-16 VITALS — BP 92/62 | HR 63 | Ht 63.0 in | Wt 151.0 lb

## 2021-08-16 DIAGNOSIS — Z7189 Other specified counseling: Secondary | ICD-10-CM

## 2021-08-16 DIAGNOSIS — Z712 Person consulting for explanation of examination or test findings: Secondary | ICD-10-CM

## 2021-08-16 DIAGNOSIS — Z01818 Encounter for other preprocedural examination: Secondary | ICD-10-CM | POA: Diagnosis not present

## 2021-08-16 NOTE — Patient Instructions (Signed)

## 2022-02-10 ENCOUNTER — Other Ambulatory Visit: Payer: Self-pay

## 2022-02-10 ENCOUNTER — Encounter: Payer: Self-pay | Admitting: Family Medicine

## 2022-02-10 ENCOUNTER — Ambulatory Visit (INDEPENDENT_AMBULATORY_CARE_PROVIDER_SITE_OTHER): Payer: BC Managed Care – PPO | Admitting: Family Medicine

## 2022-02-10 VITALS — BP 106/66 | HR 77 | Temp 97.7°F | Resp 18 | Ht 63.0 in | Wt 145.4 lb

## 2022-02-10 DIAGNOSIS — R102 Pelvic and perineal pain: Secondary | ICD-10-CM | POA: Diagnosis not present

## 2022-02-10 DIAGNOSIS — R1032 Left lower quadrant pain: Secondary | ICD-10-CM | POA: Diagnosis not present

## 2022-02-10 DIAGNOSIS — R197 Diarrhea, unspecified: Secondary | ICD-10-CM

## 2022-02-10 LAB — CBC WITH DIFFERENTIAL/PLATELET
Basophils Absolute: 0 10*3/uL (ref 0.0–0.1)
Basophils Relative: 0.7 % (ref 0.0–3.0)
Eosinophils Absolute: 0.2 10*3/uL (ref 0.0–0.7)
Eosinophils Relative: 3 % (ref 0.0–5.0)
HCT: 39.1 % (ref 36.0–46.0)
Hemoglobin: 13 g/dL (ref 12.0–15.0)
Lymphocytes Relative: 43.5 % (ref 12.0–46.0)
Lymphs Abs: 2.5 10*3/uL (ref 0.7–4.0)
MCHC: 33.2 g/dL (ref 30.0–36.0)
MCV: 85.9 fl (ref 78.0–100.0)
Monocytes Absolute: 0.4 10*3/uL (ref 0.1–1.0)
Monocytes Relative: 7.1 % (ref 3.0–12.0)
Neutro Abs: 2.6 10*3/uL (ref 1.4–7.7)
Neutrophils Relative %: 45.7 % (ref 43.0–77.0)
Platelets: 253 10*3/uL (ref 150.0–400.0)
RBC: 4.55 Mil/uL (ref 3.87–5.11)
RDW: 13.6 % (ref 11.5–15.5)
WBC: 5.7 10*3/uL (ref 4.0–10.5)

## 2022-02-10 LAB — HEPATIC FUNCTION PANEL
ALT: 11 U/L (ref 0–35)
AST: 15 U/L (ref 0–37)
Albumin: 4.2 g/dL (ref 3.5–5.2)
Alkaline Phosphatase: 57 U/L (ref 39–117)
Bilirubin, Direct: 0.1 mg/dL (ref 0.0–0.3)
Total Bilirubin: 0.6 mg/dL (ref 0.2–1.2)
Total Protein: 7.3 g/dL (ref 6.0–8.3)

## 2022-02-10 LAB — BASIC METABOLIC PANEL
BUN: 19 mg/dL (ref 6–23)
CO2: 24 mEq/L (ref 19–32)
Calcium: 9.2 mg/dL (ref 8.4–10.5)
Chloride: 106 mEq/L (ref 96–112)
Creatinine, Ser: 0.87 mg/dL (ref 0.40–1.20)
GFR: 85.82 mL/min (ref >60.00)
Glucose, Bld: 89 mg/dL (ref 70–99)
Potassium: 3.9 mEq/L (ref 3.5–5.1)
Sodium: 138 mEq/L (ref 135–145)

## 2022-02-10 LAB — LIPASE: Lipase: 22 U/L (ref 11.0–59.0)

## 2022-02-10 LAB — AMYLASE: Amylase: 57 U/L (ref 27–131)

## 2022-02-10 LAB — TSH: TSH: 1.38 u[IU]/mL (ref 0.35–5.50)

## 2022-02-10 NOTE — Progress Notes (Signed)
Spoke w/ pt and advised of lab results  

## 2022-02-10 NOTE — Addendum Note (Signed)
Addended by: Lenard Simmer on: 02/10/2022 02:51 PM   Modules accepted: Orders

## 2022-02-10 NOTE — Patient Instructions (Addendum)
Follow up as needed or as scheduled We'll notify you of your lab results and make any changes if needed We'll call you to schedule your CT scan We'll call you to schedule your GI appt Make sure you are drinking LOTS of fluids Call with any questions or concerns Hang in there!!!

## 2022-02-10 NOTE — Progress Notes (Signed)
   Subjective:    Patient ID: Sarah Stokes, female    DOB: Feb 01, 1986, 36 y.o.   MRN: [id number]  HPI L pelvic pain- pt reports pain x2 yrs but this is worsening.  Has seen OB and has been told it is not a cyst.  Pain described as a 'deep burning pain'.  Pain is worse w/ deep cough or bearing down.  Sxs are mild to start the day and worsen as the day goes on.  Pain improves w/ resting or being still.  + nausea.  No vomiting.  Now 5 weeks of diarrhea.  Pt reports 2-3 episodes/day.  Will have diarrhea after eating.  Denies abd cramping.  Has hx of C diff but this lacks 'the distinct smell'.  Had 1 episode of fever to 102.5 (a few weeks ago).  [place] but she already had diarrhea.  No blood in stool.  Pt feels like the pelvic pain and diarrhea are separate issues but the pain is worse due to the frequency of stools.  No changes that pt can report- no change to diet, medications, supplements.   Review of Systems For ROS see HPI     Objective:   Physical Exam Vitals reviewed.  Constitutional:      General: She is not in acute distress.    Appearance: Normal appearance. She is not ill-appearing.  HENT:     Head: Normocephalic and atraumatic.  Cardiovascular:     Rate and Rhythm: Normal rate and regular rhythm.     Pulses: Normal pulses.  Pulmonary:     Effort: Pulmonary effort is normal. No respiratory distress.     Breath sounds: No wheezing.  Abdominal:     General: There is no distension.     Palpations: Abdomen is soft.     Tenderness: There is abdominal tenderness (TTP over L pelvic/inguinal region). There is no guarding or rebound.  Skin:    General: Skin is warm and dry.  Neurological:     General: No focal deficit present.     Mental Status: She is alert and oriented to person, place, and time.  Psychiatric:        Mood and Affect: Mood normal.        Behavior: Behavior normal.        Thought Content: Thought content normal.            Assessment & Plan:  LLQ/pelvic pain- new to provider, ongoing for pt x2 years but recently worsening.  She finds that pain is worse w/ bearing down- cough, laugh, sneeze, BM.  Pain described as a burning pain.  Given location and normal pelvic US (done 2 yrs ago) suspect possible hernia.  Given that she has had pain x2 yrs, will get CT scan to assess and if hernia present will refer to general surgery.  Pt expressed understanding and is in agreement w/ plan.   Diarrhea- new.  x5 weeks.  Stools are loose and watery but no blood.  + nausea and bloating but no vomiting.  Had 1 day of fever a few weeks ago but none since.  No change in diet, meds, supplements.  No recent travel.  [place].  Has hx of C Diff.  Will get stool studies to assess and place referral to GI.  Pt expressed understanding and is in agreement w/ plan.

## 2022-02-22 ENCOUNTER — Ambulatory Visit
Admission: RE | Admit: 2022-02-22 | Discharge: 2022-02-22 | Disposition: A | Discharge status: Home / Self Care | Payer: BC Managed Care – PPO | Source: Ambulatory Visit | Attending: Family Medicine | Admitting: Family Medicine

## 2022-02-22 DIAGNOSIS — R1032 Left lower quadrant pain: Secondary | ICD-10-CM

## 2022-02-22 DIAGNOSIS — R102 Pelvic and perineal pain: Secondary | ICD-10-CM

## 2022-02-22 DIAGNOSIS — R197 Diarrhea, unspecified: Secondary | ICD-10-CM

## 2022-02-22 MED ORDER — IOPAMIDOL (ISOVUE-300) INJECTION 61%
100.0000 mL | Freq: Once | INTRAVENOUS | Status: AC | PRN
  Administered 2022-02-22: 100 mL via INTRAVENOUS

## 2022-04-12 ENCOUNTER — Telehealth: Payer: BC Managed Care – PPO | Admitting: Physician Assistant

## 2022-04-12 DIAGNOSIS — J029 Acute pharyngitis, unspecified: Secondary | ICD-10-CM | POA: Diagnosis not present

## 2022-04-12 MED ORDER — PREDNISONE 20 MG PO TABS: 40.0000 mg | ORAL_TABLET | Freq: Every day | ORAL | 0 refills | Status: DC

## 2022-04-12 MED ORDER — AMOXICILLIN 500 MG PO TABS: 500.0000 mg | ORAL_TABLET | Freq: Two times a day (BID) | ORAL | 0 refills | Status: AC

## 2022-04-12 NOTE — Progress Notes (Signed)
Virtual Visit Consent   [name], you are scheduled for a virtual visit with a [facility] provider today. Just as with appointments in the office, your consent must be obtained to participate. Your consent will be active for this visit and any virtual visit you may have with one of our providers in the next 365 days. If you have a MyChart account, a copy of this consent can be sent to you electronically.  As this is a virtual visit, video technology does not allow for your provider to perform a traditional examination. This may limit your provider's ability to fully assess your condition. If your provider identifies any concerns that need to be evaluated in person or the need to arrange testing (such as labs, EKG, etc.), we will make arrangements to do so. Although advances in technology are sophisticated, we cannot ensure that it will always work on either your end or our end. If the connection with a video visit is poor, the visit may have to be switched to a telephone visit. With either a video or telephone visit, we are not always able to ensure that we have a secure connection.  By engaging in this virtual visit, you consent to the provision of healthcare and authorize for your insurance to be billed (if applicable) for the services provided during this visit. Depending on your insurance coverage, you may receive a charge related to this service.  I need to obtain your verbal consent now. Are you willing to proceed with your visit today? Sarah Stokes has provided verbal consent on 04/12/2022 for a virtual visit (video or telephone). Leeanne Rio, [place]  Date: 04/12/2022 8:56 AM  Virtual Visit via Video Note   I, Leeanne Rio, connected with  Sarah Stokes  ([id number], 1986/01/07) on 04/12/22 at  8:30 AM EDT by a video-enabled telemedicine application and verified that I am speaking with the correct person using two identifiers.  Location: Patient: Virtual Visit  Location Patient: Home Provider: Virtual Visit Location Provider: Home Office   I discussed the limitations of evaluation and management by telemedicine and the availability of in person appointments. The patient expressed understanding and agreed to proceed.    History of Present Illness: Sarah Stokes is a 36 y.o. who identifies as a female who was assigned female at birth, and is being seen today for substantial throat pain starting a few days ago. Notes bilateral sore throat with substantial odynophagia. Kids with similar symptoms but were better within 2-3 days. Her symptoms have progressed and now associated with fever (Tmax 103). Denies other URI symptoms. Denies difficulty breathing. Is taking OTC pain relievers with only mild relief.   HPI: HPI  Problems:  Patient Active Problem List   Diagnosis Date Noted   Periorbital swelling 01/31/2017   Rash and nonspecific skin eruption    Finger swelling    Neck pain    Swelling of left eyelid 12/09/2015   Hypokalemia 12/09/2015   S/P cesarean section 08/24/2015   RUQ pain 05/21/2014   Angular cheilitis 01/31/2014   OTITIS MEDIA, SEROUS, CHRONIC 02/26/2010   RAYNAUD'S SYNDROME 02/26/2010   ANXIETY 06/08/2009   Attention deficit disorder 01/15/2009   HEADACHE 04/14/2008   ANTERIOR CRUCIATE LIGAMENT TEAR, LEFT KNEE 01/09/2007    Allergies:  Allergies  Allergen Reactions   Ambien [Zolpidem Tartrate]     Hallucinations   Clindamycin Swelling    GI Intolerance   Percocet [Oxycodone-Acetaminophen] Hives and Nausea And Vomiting   Vicodin [Hydrocodone-Acetaminophen] Hives and  Nausea And Vomiting   Adhesive [Tape] Rash    Tape from epidural    Medications: No current outpatient medications on file.  Observations/Objective: Patient is well-developed, well-nourished in no acute distress.  Resting comfortably at home.  Head is normocephalic, atraumatic.  No labored breathing. Speech is clear and coherent with logical content.   Patient is alert and oriented at baseline.  Substantial posterior oropharyngeal erythema without tonsillar swelling or exudate. Uvula with mild swelling but no lesion. Is midline.   Assessment and Plan: 1. Pharyngitis, unspecified etiology  No tonsillar exudates noted on exam. However giving fever, severe sore throat and absence of URI symptoms, some concern for bacterial cause. Will have her increase fluids, rest. OTC medications reviewed. Will start short burst of prednisone  to reduce throat inflammation and ease pain further. Amoxicillin sent in to take as directed if symptoms are not improving/resolving with steroid treatment.   Follow Up Instructions: I discussed the assessment and treatment plan with the patient. The patient was provided an opportunity to ask questions and all were answered. The patient agreed with the plan and demonstrated an understanding of the instructions.  A copy of instructions were sent to the patient via MyChart unless otherwise noted below.   The patient was advised to call back or seek an in-person evaluation if the symptoms worsen or if the condition fails to improve as anticipated.  Time:  I spent 10 minutes with the patient via telehealth technology discussing the above problems/concerns.    Piedad Climes, PA-C

## 2022-04-12 NOTE — Patient Instructions (Signed)
  Sarah Stokes, thank you for joining Sarah Rio, PA-C for today's virtual visit.  While this provider is not your primary care provider (PCP), if your PCP is located in our provider database this encounter information will be shared with them immediately following your visit.  Consent: (Patient) Sarah Stokes provided verbal consent for this virtual visit at the beginning of the encounter.  Current Medications: No current outpatient medications on file.   Medications ordered in this encounter:  No orders of the defined types were placed in this encounter.    *If you need refills on other medications prior to your next appointment, please contact your pharmacy*  Follow-Up: Call back or seek an in-person evaluation if the symptoms worsen or if the condition fails to improve as anticipated.  [place] [phone]  Other Instructions Please increase fluids, rest.  Tylenol OTC and chloraseptic spray as needed. Take the steroid as directed, adding on antibiotic if things are not quickly improving.   If not resolving or any new or worsening symptoms despite antibiotic use, you need an in-person evaluation. Please do not delay care.    If you have been instructed to have an in-person evaluation today at a local Urgent Care facility, please use the link below. It will take you to a list of all of our available [facility], including address, phone number and hours of operation. Please do not delay care.  [facility]  If you or a family member do not have a primary care provider, use the link below to schedule a visit and establish care. When you choose a [facility] primary care physician or advanced practice provider, you gain a long-term partner in health. Find a Primary Care Provider  Learn more about [facility] in-office and virtual care options: [facility]

## 2022-07-29 ENCOUNTER — Telehealth: Payer: Self-pay | Admitting: *Deleted

## 2022-07-29 NOTE — Patient Outreach (Signed)
  Care Coordination   07/29/2022 Name: Sarah Stokes MRN: [id number] DOB: [birth year]   Care Coordination Outreach Attempts:  An unsuccessful telephone outreach was attempted today to offer the patient information about available care coordination services as a benefit of their health plan.   Follow Up Plan:  Additional outreach attempts will be made to offer the patient care coordination information and services.   Encounter Outcome:  No Answer   Care Coordination Interventions:  No, not indicated    Raina Mina, RN Care Management Coordinator [place] Office [phone]

## 2022-10-31 ENCOUNTER — Telehealth: Payer: BC Managed Care – PPO | Admitting: Nurse Practitioner

## 2022-10-31 DIAGNOSIS — L089 Local infection of the skin and subcutaneous tissue, unspecified: Secondary | ICD-10-CM

## 2022-10-31 MED ORDER — SULFAMETHOXAZOLE-TRIMETHOPRIM 800-160 MG PO TABS: 1.0000 | ORAL_TABLET | Freq: Two times a day (BID) | ORAL | 0 refills | Status: AC

## 2022-10-31 NOTE — Progress Notes (Signed)
E Visit for Cellulitis  We are sorry that you are not feeling well. Here is how we plan to help!  Based on what you shared with me it looks like you have cellulitis.  Cellulitis looks like areas of skin redness, swelling, and warmth; it develops as a result of bacteria entering under the skin. Little red spots and/or bleeding can be seen in skin, and tiny surface sacs containing fluid can occur. Fever can be present. Cellulitis is almost always on one side of a body, and the lower limbs are the most common site of involvement.   I have prescribed:  Bactrim DS 1 tablet by mouth twice a day for 7 days  HOME CARE:  Take your medications as ordered and take all of them, even if the skin irritation appears to be healing.   GET HELP RIGHT AWAY IF:  Symptoms that don't begin to go away within 48 hours. Severe redness persists or worsens If the area turns color, spreads or swells. If it blisters and opens, develops yellow-brown crust or bleeds. You develop a fever or chills. If the pain increases or becomes unbearable.  Are unable to keep fluids and food down.  MAKE SURE YOU   Understand these instructions. Will watch your condition. Will get help right away if you are not doing well or get worse.  Thank you for choosing an e-visit.  Your e-visit answers were reviewed by a board certified advanced clinical practitioner to complete your personal care plan. Depending upon the condition, your plan could have included both over the counter or prescription medications.  Please review your pharmacy choice. Make sure the pharmacy is open so you can pick up prescription now. If there is a problem, you may contact your provider through MyChart messaging and have the prescription routed to another pharmacy.  Your safety is important to us. If you have drug allergies check your prescription carefully.   For the next 24 hours you can use MyChart to ask questions about today's visit, request a  non-urgent call back, or ask for a work or school excuse. You will get an email in the next two days asking about your experience. I hope that your e-visit has been valuable and will speed your recovery.   Meds ordered this encounter  Medications   sulfamethoxazole-trimethoprim (BACTRIM DS) 800-160 MG tablet    Sig: Take 1 tablet by mouth 2 (two) times daily for 7 days.    Dispense:  14 tablet    Refill:  0    I spent approximately 5 minutes reviewing the patient's history, current symptoms and coordinating their care today.   

## 2022-11-08 ENCOUNTER — Telehealth: Payer: BC Managed Care – PPO | Admitting: Family

## 2022-11-08 DIAGNOSIS — Z20818 Contact with and (suspected) exposure to other bacterial communicable diseases: Secondary | ICD-10-CM | POA: Diagnosis not present

## 2022-11-08 DIAGNOSIS — J029 Acute pharyngitis, unspecified: Secondary | ICD-10-CM

## 2022-11-08 MED ORDER — AMOXICILLIN 500 MG PO CAPS: 500.0000 mg | ORAL_CAPSULE | Freq: Two times a day (BID) | ORAL | 0 refills | Status: AC

## 2022-11-08 NOTE — Progress Notes (Signed)

## 2023-02-06 ENCOUNTER — Ambulatory Visit (INDEPENDENT_AMBULATORY_CARE_PROVIDER_SITE_OTHER): Payer: BC Managed Care – PPO | Admitting: Family

## 2023-02-06 VITALS — BP 107/74 | HR 64 | Temp 97.7°F | Wt 151.4 lb

## 2023-02-06 DIAGNOSIS — H938X1 Other specified disorders of right ear: Secondary | ICD-10-CM

## 2023-02-06 DIAGNOSIS — R350 Frequency of micturition: Secondary | ICD-10-CM

## 2023-02-06 LAB — POC URINALSYSI DIPSTICK (AUTOMATED)
Bilirubin, UA: NEGATIVE
Blood, UA: NEGATIVE
Glucose, UA: NEGATIVE
Ketones, UA: NEGATIVE
Leukocytes, UA: NEGATIVE
Nitrite, UA: NEGATIVE
Protein, UA: NEGATIVE
Spec Grav, UA: 1.025 (ref 1.010–1.025)
Urobilinogen, UA: 0.2 E.U./dL
pH, UA: 6 (ref 5.0–8.0)

## 2023-02-06 NOTE — Progress Notes (Signed)
Patient ID: Sarah Stokes, female    DOB: 1986-05-31, 37 y.o.   MRN: [id number]  Chief Complaint  Patient presents with   Ear Fullness    Right ear    Back Pain    Mid to low back pain    Chills   Urinary Frequency    HPI:      Urinary sx:  reports having chills, frequency, urgency, dark color to urine, mid & low back pain. Denies any hematuria, dysuria, foul odor, pelvic pain, vaginal d/c or itching. Last UTI was about a year ago. Pt denies taking any OTC meds.  Ear fullness: c/o congestion in right ear states she had covid in dec and ever since then she has a full, congested right ear. She has been treated twice for ear infections and also had pain w/ those episodes, but denies any pain today. Reports it does "pop" open at times and feels less congested.  Assessment & Plan:  1. Frequent urination- UA neg. Advised pt she could have had a mild uti, and resolved on its own. Advised on continued increased water intake, at least 2L per day.  - POCT Urinalysis Dipstick (Automated)  2. Ear congestion, right- TM wnl. Advised post infection effusions can take up to 2mos to completely resolve, she can try OTC generic Flonase or Nasacort nasal spray, as has helped some people feel better, advised on use & SE.   Subjective:    Outpatient Medications Prior to Visit  Medication Sig Dispense Refill   predniSONE (DELTASONE) 20 MG tablet Take 2 tablets (40 mg total) by mouth daily with breakfast. 10 tablet 0   No facility-administered medications prior to visit.   Past Medical History:  Diagnosis Date   Anxiety    History of Raynaud's syndrome    Hx of varicella    Migraine 2006   Periorbital cellulitis    Postpartum care following cesarean delivery (10/6) 04/16/2013   Status post primary low transverse cesarean section 04/15/2013   Past Surgical History:  Procedure Laterality Date   CESAREAN SECTION N/A 04/15/2013   Procedure: CESAREAN SECTION;  Surgeon: Sarah Fries, MD;   Location: [facility];  Service: Obstetrics;  Laterality: N/A;   CESAREAN SECTION N/A 08/24/2015   Procedure: CESAREAN SECTION;  Surgeon: Sarah Overlie, MD;  Location: [facility];  Service: Obstetrics;  Laterality: N/A;  REPEAT EDC 08/28/15    CESAREAN SECTION N/A 01/16/2018   Procedure: REPEAT CESAREAN SECTION;  Surgeon: Sarah Overlie, MD;  Location: [facility]IRTH[facility] SU[facility];  Service: Obstetrics;  Laterality: N/A;  Repeat edc 01/21/18 allergy to vicodin, percocet, Sarah Stokes, RNFA   HIP SURGERY     took bone for knee   KNEE SURGERY     left x6, Dr. Eulah Stokes   labial repair  2006   TONSILLECTOMY     Allergies  Allergen Reactions   Hydrocodone-Acetaminophen Hives, Rash and Nausea And Vomiting   Ambien [Zolpidem Tartrate]     Hallucinations   Clindamycin Swelling    GI Intolerance   Percocet [Oxycodone-Acetaminophen] Hives and Nausea And Vomiting   Vicodin [Hydrocodone-Acetaminophen] Hives and Nausea And Vomiting   Adhesive [Tape] Rash    Tape from epidural       Objective:    Physical Exam Vitals and nursing note reviewed.  Constitutional:      Appearance: Normal appearance.  HENT:     Right Ear: Tympanic membrane and ear canal normal. No middle ear effusion. Tympanic membrane is not scarred, erythematous, retracted or  bulging.     Left Ear: Tympanic membrane and ear canal normal.  Cardiovascular:     Rate and Rhythm: Normal rate and regular rhythm.  Pulmonary:     Effort: Pulmonary effort is normal.     Breath sounds: Normal breath sounds.  Musculoskeletal:        General: Normal range of motion.  Skin:    General: Skin is warm and dry.  Neurological:     Mental Status: She is alert.  Psychiatric:        Mood and Affect: Mood normal.        Behavior: Behavior normal.    BP 107/74   Pulse 64   Temp 97.7 F (36.5 C) (Temporal)   Wt 151 lb 6.4 oz (68.7 kg)   SpO2 99%   BMI 26.82 kg/m  Wt Readings from Last 3 Encounters:  02/06/23 151 lb 6.4 oz (68.7 kg)  02/10/22  145 lb 6.4 oz (66 kg)  08/16/21 151 lb (68.5 kg)       Dulce Sellar, NP

## 2023-02-06 NOTE — Patient Instructions (Addendum)
It was very nice to see you today!   Your urine is clear of any infection today. Continue drinking at least 2 liters of water daily. Your right ear will hopefully feel a little better each day. It is a good sign that it is opening up at times. If it continues to remain congested/full feeling let your PCP know, you may consider a referral to an Audiologist or ENT provider for further evaluation.   As discussed, you could try over the counter generic Flonase or Nasacort - 1 spray each nostril twice a day for 3 days, then just daily. This has helped a few people, just not a proven method of helping ear congestion.      PLEASE NOTE:  If you had any lab tests please let us know if you have not heard back within a few days. You may see your results on MyChart before we have a chance to review them but we will give you a call once they are reviewed by [place]. If we ordered any referrals today, please let us know if you have not heard from their office within the next week.

## 2023-08-16 HISTORY — PX: KNEE ARTHROSCOPY: SUR90

## 2023-10-26 NOTE — H&P (Signed)
 38 year old G 1 P 1 with excessive menstruation. History of endometrial ablation.  Possible adenomyosis.   Past Medical History:  Diagnosis Date   Anxiety    History of Raynaud's syndrome    Hx of varicella    Migraine 2006   Periorbital cellulitis    Postpartum care following cesarean delivery (10/6) 04/16/2013   Status post primary low transverse cesarean section 04/15/2013   Past Surgical History:  Procedure Laterality Date   CESAREAN SECTION N/A 04/15/2013   Procedure: CESAREAN SECTION;  Surgeon: Robley Fries, MD;  Location: [facility];  Service: Obstetrics;  Laterality: N/A;   CESAREAN SECTION N/A 08/24/2015   Procedure: CESAREAN SECTION;  Surgeon: Marcelle Overlie, MD;  Location: [facility];  Service: Obstetrics;  Laterality: N/A;  REPEAT EDC 08/28/15    CESAREAN SECTION N/A 01/16/2018   Procedure: REPEAT CESAREAN SECTION;  Surgeon: Marcelle Overlie, MD;  Location: [facility]TH[facility] SU[facility];  Service: Obstetrics;  Laterality: N/A;  Repeat edc 01/21/18 allergy to vicodin, percocet, Armond Hang, RNFA   HIP SURGERY     took bone for knee   KNEE SURGERY     left x6, Dr. Eulah Pont   labial repair  2006   TONSILLECTOMY     Prior to Admission medications   Not on File   Allergies Hydrocodone-acetaminophen, Ambien [zolpidem tartrate], Clindamycin, Percocet [oxycodone-acetaminophen], Vicodin [hydrocodone-acetaminophen], and Adhesive [tape] Family History  Problem Relation Age of Onset   Thyroid disease Mother    Cancer Mother        melanoma   Allergic rhinitis Mother    Diabetes Maternal Aunt    Diabetes Maternal Uncle    Thyroid disease Paternal Grandmother    Allergic rhinitis Father    Allergic rhinitis Sister    Allergic rhinitis Brother    Social History   Socioeconomic History   Marital status: Married    Spouse name: Not on file   Number of children: Not on file   Years of education: Not on file   Highest education level: Professional school degree (e.g., MD, DDS, DVM, JD)   Occupational History   Not on file  Tobacco Use   Smoking status: Never   Smokeless tobacco: Never  Vaping Use   Vaping status: Never Used  Substance and Sexual Activity   Alcohol use: No   Drug use: No   Sexual activity: Yes    Birth control/protection: None  Other Topics Concern   Not on file  Social History Narrative   Graduated from [employer], going to [employer].   Social Drivers of Corporate investment banker Strain: Low Risk  (02/06/2023)   Overall Financial Resource Strain (CARDIA)    Difficulty of Paying Living Expenses: Not hard at all  Food Insecurity: No Food Insecurity (02/06/2023)   Hunger Vital Sign    Worried About Running Out of Food in the Last Year: Never true    Ran Out of Food in the Last Year: Never true  Transportation Needs: No Transportation Needs (02/06/2023)   PRAPARE - Administrator, Civil Service (Medical): No    Lack of Transportation (Non-Medical): No  Physical Activity: Insufficiently Active (02/06/2023)   Exercise Vital Sign    Days of Exercise per Week: 3 days    Minutes of Exercise per Session: 30 min  Stress: No Stress Concern Present (02/06/2023)   [employer] of Occupational Health - Occupational Stress Questionnaire    Feeling of Stress : Not at all  Social Connections: Socially Integrated (02/06/2023)   Social Connection and Isolation Panel [NHANES]    Frequency of Communication with Friends and Family: More than three times a week    Frequency of Social Gatherings with Friends and Family: Twice a week    Attends Religious Services: 1 to 4 times per year    Active Member of [employer] or Organizations: Yes    Attends Banker Meetings: 1 to 4 times per year    Marital Status: Married   There were no vitals taken for this visit. General alert and oriented Lung CTAB Car RRR Abdomen soft and non tender Pelvic EGBUS WNL Uterus is WNL Adnexae WNL  IMPRESSION: Excessive  Menstruation PLAN: Proceed with LAVH/BS possible TAH (History of C Section)  Risks reviewed Consent is signed

## 2023-11-02 ENCOUNTER — Encounter (HOSPITAL_COMMUNITY)
Admission: RE | Admit: 2023-11-02 | Discharge: 2023-11-02 | Disposition: A | Discharge status: Home / Self Care | Source: Ambulatory Visit | Attending: Obstetrics and Gynecology | Admitting: Obstetrics and Gynecology

## 2023-11-02 ENCOUNTER — Encounter (HOSPITAL_COMMUNITY): Payer: Self-pay | Admitting: Obstetrics and Gynecology

## 2023-11-02 DIAGNOSIS — N920 Excessive and frequent menstruation with regular cycle: Secondary | ICD-10-CM | POA: Insufficient documentation

## 2023-11-02 DIAGNOSIS — Z01812 Encounter for preprocedural laboratory examination: Secondary | ICD-10-CM | POA: Insufficient documentation

## 2023-11-02 DIAGNOSIS — Z01818 Encounter for other preprocedural examination: Secondary | ICD-10-CM | POA: Diagnosis present

## 2023-11-02 LAB — CBC
HCT: 38.5 % (ref 36.0–46.0)
Hemoglobin: 12.2 g/dL (ref 12.0–15.0)
MCH: 28 pg (ref 26.0–34.0)
MCHC: 31.7 g/dL (ref 30.0–36.0)
MCV: 88.3 fL (ref 80.0–100.0)
Platelets: 287 10*3/uL (ref 150–400)
RBC: 4.36 MIL/uL (ref 3.87–5.11)
RDW: 12.3 % (ref 11.5–15.5)
WBC: 7 10*3/uL (ref 4.0–10.5)
nRBC: 0 % (ref 0.0–0.2)

## 2023-11-02 LAB — TYPE AND SCREEN
ABO/RH(D): O POS
Antibody Screen: NEGATIVE

## 2023-11-02 NOTE — Progress Notes (Signed)
 Spoke w/ via phone for pre-op interview--- pt Lab needs dos----   urine preg      Lab results------ lab appt 11-02-2023 @ 0900 getting CBC/T&S COVID test -----patient states asymptomatic no test needed Arrive at ------- 11-06-2023 @ 0530 NPO after MN  Pre-Surgery Ensure or G2: n/a  Med rec completed Medications to take morning of surgery ----- none Diabetic medication ----- n/a  GLP1 agonist last dose: n/a GLP1 instructions:  Patient instructed no nail polish to be worn day of surgery Patient instructed to bring photo id and insurance card day of surgery Patient aware to have Driver (ride ) / caregiver    for 24 hours after surgery -  husband, Sarah Stokes Patient Special Instructions ----- will pick up bag w/ soap and written instructions at lab appt Pre-Op special Instructions ----- n/a  Patient verbalized understanding of instructions that were given at this phone interview. Patient denies chest pain, sob, fever, cough at the interview.

## 2023-11-02 NOTE — Pre-Procedure Instructions (Signed)
 Surgical Instructions   Your procedure is scheduled on :  Monday,  11-06-2023. Report to [facility] Main Entrance "A" at 5:30  A.M., then check in with the Admitting office. Any questions or running late day of surgery: call [phone]  Questions prior to your surgery date: call [phone], Monday-Friday, 8am-4pm. If you experience any cold or flu symptoms such as cough, fever, chills, shortness of breath, etc. between now and your scheduled surgery, please notify your surgeon office.    Remember:  Do not eat any food and do not drink any liquids after midnight the night before your surgery.  This includes no water,  candy,  gum,  and  mints.    Take these medicines the morning of surgery with A SIP OF WATER :  none   May take these medicines IF NEEDED: Allergy eye drops X-Clear nasal saline spray   One week prior to surgery, STOP taking any Aspirin (unless otherwise instructed by your surgeon) Aleve, Naproxen, Ibuprofen , Motrin , Advil , Goody's, BC's, all herbal medications, fish oil, and non-prescription vitamins.                     Do NOT Smoke (Tobacco/Vaping) and Do Not drink alcohol for 24 hours prior to your procedure.  If you use a CPAP at night, you may bring your mask/headgear for your overnight stay.   You will be asked to remove any contacts, glasses, piercing's, hearing aid's, dentures/partials prior to surgery. Please bring cases for these items if needed.    Patients discharged the day of surgery will not be allowed to drive home, and someone needs to stay with them for 24 hours.  SURGICAL WAITING ROOM VISITATION Patients may have no more than 2 support people in the waiting area - these visitors may rotate.   Pre-op nurse will coordinate an appropriate time for 1 ADULT support person, who may not rotate, to accompany patient in pre-op.  Children under the age of 3 must have an adult with them who is not the patient and must remain in the main waiting area  with an adult.  If the patient needs to stay at the hospital during part of their recovery, the visitor guidelines for inpatient rooms apply.  Please refer to the [facility] website for the visitor guidelines for any additional information.   If you received a COVID test during your pre-op visit  it is requested that you wear a mask when out in public, stay away from anyone that may not be feeling well and notify your surgeon if you develop symptoms. If you have been in contact with anyone that has tested positive in the last 10 days please notify you surgeon.      Pre-operative CHG Bathing Instructions   You can play a key role in reducing the risk of infection after surgery. Your skin needs to be as free of germs as possible. You can reduce the number of germs on your skin by washing with CHG (chlorhexidine  gluconate) soap before surgery. CHG is an antiseptic soap that kills germs and continues to kill germs even after washing.   DO NOT use if you have an allergy to chlorhexidine /CHG or antibacterial soaps. If your skin becomes reddened or irritated, stop using the CHG and notify Pre-Op nurse day of surgery.  Please get dial soap or other antibacterial soap and shower following the instructions below.             TAKE A SHOWER THE  NIGHT BEFORE SURGERY AND THE DAY OF SURGERY    Please keep in mind the following:  DO NOT shave, including legs and underarms, 48 hours prior to surgery.   You may shave your face before/day of surgery.  Place clean sheets on your bed the night before surgery Use a clean washcloth (not used since being washed) for each shower. DO NOT sleep with pet's night before surgery.  CHG Shower Instructions:  Wash your face and private area with normal soap. If you choose to wash your hair, wash first with your normal shampoo.  After you use shampoo/soap, rinse your hair and body thoroughly to remove shampoo/soap residue.  Turn the water OFF and apply half the  bottle of CHG soap to a CLEAN washcloth.  Apply CHG soap ONLY FROM YOUR NECK DOWN TO YOUR TOES (washing for 3-5 minutes)  DO NOT use CHG soap on face, private areas, open wounds, or sores.  Pay special attention to the area where your surgery is being performed.  If you are having back surgery, having someone wash your back for you may be helpful. Wait 2 minutes after CHG soap is applied, then you may rinse off the CHG soap.  Pat dry with a clean towel  Put on clean pajamas    Additional instructions for the day of surgery: DO NOT APPLY any lotions, oils, deodorants (may use underarm deodorant) ,  cologne or perfumes  or makeup Do not wear jewelry /  piercing's/  metal/  permanent jewelry must be removed prior to arrival day of surgery.  (No plastic) Do not wear nail polish, gel polish, artificial nails, or any other type of covering on natural finger nails (toe nails are okay) Do not bring valuables to the hospital. [facility] is not responsible for valuables/personal belongings. Put on clean/comfortable clothes.  Please brush your teeth.  Ask your nurse before applying any prescription medications to the skin.

## 2023-11-05 NOTE — Anesthesia Preprocedure Evaluation (Signed)
 Anesthesia Evaluation  Patient identified by MRN, date of birth, ID band Patient awake    Reviewed: Allergy & Precautions, NPO status , Patient's Chart, lab work & pertinent test results  Airway Mallampati: II  TM Distance: >3 FB Neck ROM: Full    Dental no notable dental hx.    Pulmonary neg pulmonary ROS   Pulmonary exam normal        Cardiovascular negative cardio ROS  Rhythm:Regular Rate:Normal     Neuro/Psych  Headaches  Anxiety        GI/Hepatic negative GI ROS, Neg liver ROS,,,  Endo/Other  negative endocrine ROS    Renal/GU negative Renal ROS  Female GU complaint     Musculoskeletal  (+) Arthritis , Osteoarthritis,    Abdominal Normal abdominal exam  (+)   Peds  Hematology Lab Results      Component                Value               Date                      WBC                      7.0                 11/02/2023                HGB                      12.2                11/02/2023                HCT                      38.5                11/02/2023                MCV                      88.3                11/02/2023                PLT                      287                 11/02/2023              Anesthesia Other Findings   Reproductive/Obstetrics                             Anesthesia Physical Anesthesia Plan  ASA: 2  Anesthesia Plan: General   Post-op Pain Management: Celebrex PO (pre-op)* and Tylenol  PO (pre-op)*   Induction: Intravenous  PONV Risk Score and Plan: 3 and Ondansetron , Dexamethasone , Treatment may vary due to age or medical condition, Scopolamine  patch - Pre-op, Propofol infusion, TIVA and Midazolam   Airway Management Planned: Mask and Oral ETT  Additional Equipment: None  Intra-op Plan:   Post-operative Plan: Extubation in OR  Informed Consent: I have reviewed the patients History and Physical, chart, labs and discussed the procedure  including the risks, benefits and alternatives for the proposed anesthesia with the patient or authorized representative who has indicated his/her understanding and acceptance.     Dental advisory given  Plan Discussed with: CRNA  Anesthesia Plan Comments:        Anesthesia Quick Evaluation

## 2023-11-06 ENCOUNTER — Encounter (HOSPITAL_COMMUNITY): Payer: Self-pay | Admitting: Obstetrics and Gynecology

## 2023-11-06 ENCOUNTER — Ambulatory Visit (HOSPITAL_COMMUNITY)
Admission: RE | Admit: 2023-11-06 | Discharge: 2023-11-07 | Disposition: A | Discharge status: Home / Self Care | Payer: BC Managed Care – PPO | Attending: Obstetrics and Gynecology | Admitting: Obstetrics and Gynecology

## 2023-11-06 ENCOUNTER — Ambulatory Visit (HOSPITAL_COMMUNITY): Payer: Self-pay | Admitting: Anesthesiology

## 2023-11-06 ENCOUNTER — Encounter (HOSPITAL_COMMUNITY)
Admission: RE | Disposition: A | Discharge status: Home / Self Care | Payer: Self-pay | Source: Home / Self Care | Attending: Obstetrics and Gynecology

## 2023-11-06 ENCOUNTER — Other Ambulatory Visit: Payer: Self-pay

## 2023-11-06 DIAGNOSIS — N920 Excessive and frequent menstruation with regular cycle: Secondary | ICD-10-CM | POA: Diagnosis present

## 2023-11-06 DIAGNOSIS — Z01818 Encounter for other preprocedural examination: Secondary | ICD-10-CM

## 2023-11-06 DIAGNOSIS — N888 Other specified noninflammatory disorders of cervix uteri: Secondary | ICD-10-CM | POA: Diagnosis not present

## 2023-11-06 DIAGNOSIS — N946 Dysmenorrhea, unspecified: Secondary | ICD-10-CM | POA: Diagnosis not present

## 2023-11-06 HISTORY — DX: Other chronic pain: G89.29

## 2023-11-06 HISTORY — PX: LAPAROSCOPIC VAGINAL HYSTERECTOMY WITH SALPINGECTOMY: SHX6680

## 2023-11-06 HISTORY — DX: Excessive and frequent menstruation with regular cycle: N92.0

## 2023-11-06 HISTORY — DX: Raynaud's syndrome without gangrene: I73.00

## 2023-11-06 HISTORY — DX: Osteoarthritis of knee, unspecified: M17.9

## 2023-11-06 LAB — POCT PREGNANCY, URINE: Preg Test, Ur: NEGATIVE

## 2023-11-06 SURGERY — HYSTERECTOMY, VAGINAL, LAPAROSCOPY-ASSISTED, WITH SALPINGECTOMY
Anesthesia: General | Site: Pelvis | Laterality: Bilateral

## 2023-11-06 MED ORDER — CHLORHEXIDINE GLUCONATE 0.12 % MT SOLN
OROMUCOSAL | Status: AC
  Filled 2023-11-06: qty 15

## 2023-11-06 MED ORDER — CHLORHEXIDINE GLUCONATE 0.12 % MT SOLN
15.0000 mL | Freq: Once | OROMUCOSAL | Status: AC
  Administered 2023-11-06: 15 mL via OROMUCOSAL

## 2023-11-06 MED ORDER — LIDOCAINE 2% (20 MG/ML) 5 ML SYRINGE
INTRAMUSCULAR | Status: AC
  Filled 2023-11-06: qty 5

## 2023-11-06 MED ORDER — ONDANSETRON HCL 4 MG/2ML IJ SOLN
4.0000 mg | Freq: Four times a day (QID) | INTRAMUSCULAR | Status: DC | PRN
  Administered 2023-11-06: 4 mg via INTRAVENOUS
  Filled 2023-11-06: qty 2

## 2023-11-06 MED ORDER — ORAL CARE MOUTH RINSE: 15.0000 mL | Freq: Once | OROMUCOSAL | Status: AC

## 2023-11-06 MED ORDER — ACETAMINOPHEN 500 MG PO TABS
1000.0000 mg | ORAL_TABLET | Freq: Once | ORAL | Status: AC
  Administered 2023-11-06: 1000 mg via ORAL

## 2023-11-06 MED ORDER — AMISULPRIDE (ANTIEMETIC) 5 MG/2ML IV SOLN: 10.0000 mg | Freq: Once | INTRAVENOUS | Status: DC | PRN

## 2023-11-06 MED ORDER — LACTATED RINGERS IV SOLN: INTRAVENOUS | Status: DC

## 2023-11-06 MED ORDER — FENTANYL CITRATE (PF) 100 MCG/2ML IJ SOLN
INTRAMUSCULAR | Status: AC
  Filled 2023-11-06: qty 2

## 2023-11-06 MED ORDER — BUPIVACAINE HCL (PF) 0.25 % IJ SOLN
INTRAMUSCULAR | Status: DC | PRN
  Administered 2023-11-06: 6 mL

## 2023-11-06 MED ORDER — ROCURONIUM BROMIDE 10 MG/ML (PF) SYRINGE
PREFILLED_SYRINGE | INTRAVENOUS | Status: AC
  Filled 2023-11-06: qty 10

## 2023-11-06 MED ORDER — HEMOSTATIC AGENTS (NO CHARGE) OPTIME
TOPICAL | Status: DC | PRN
  Administered 2023-11-06: 1

## 2023-11-06 MED ORDER — SIMETHICONE 80 MG PO CHEW
80.0000 mg | CHEWABLE_TABLET | Freq: Four times a day (QID) | ORAL | Status: DC | PRN
  Administered 2023-11-06 – 2023-11-07 (×2): 80 mg via ORAL
  Filled 2023-11-06 (×2): qty 1

## 2023-11-06 MED ORDER — ACETAMINOPHEN 10 MG/ML IV SOLN: 1000.0000 mg | Freq: Once | INTRAVENOUS | Status: DC | PRN

## 2023-11-06 MED ORDER — 0.9 % SODIUM CHLORIDE (POUR BTL) OPTIME
TOPICAL | Status: DC | PRN
  Administered 2023-11-06: 1000 mL

## 2023-11-06 MED ORDER — FENTANYL CITRATE (PF) 250 MCG/5ML IJ SOLN
INTRAMUSCULAR | Status: AC
  Filled 2023-11-06: qty 5

## 2023-11-06 MED ORDER — MIDAZOLAM HCL 2 MG/2ML IJ SOLN
INTRAMUSCULAR | Status: AC
  Filled 2023-11-06: qty 2

## 2023-11-06 MED ORDER — TRAMADOL HCL 50 MG PO TABS
50.0000 mg | ORAL_TABLET | Freq: Four times a day (QID) | ORAL | Status: DC | PRN
  Administered 2023-11-06 (×2): 50 mg via ORAL
  Filled 2023-11-06 (×2): qty 1

## 2023-11-06 MED ORDER — POVIDONE-IODINE 10 % EX SWAB: 2.0000 | Freq: Once | CUTANEOUS | Status: DC

## 2023-11-06 MED ORDER — PROPOFOL 10 MG/ML IV BOLUS
INTRAVENOUS | Status: DC | PRN
  Administered 2023-11-06: 120 mg via INTRAVENOUS

## 2023-11-06 MED ORDER — ONDANSETRON HCL 4 MG PO TABS: 4.0000 mg | ORAL_TABLET | Freq: Four times a day (QID) | ORAL | Status: DC | PRN

## 2023-11-06 MED ORDER — HYDROMORPHONE HCL 1 MG/ML IJ SOLN: 0.2000 mg | INTRAMUSCULAR | Status: DC | PRN

## 2023-11-06 MED ORDER — MIDAZOLAM HCL 2 MG/2ML IJ SOLN
INTRAMUSCULAR | Status: DC | PRN
  Administered 2023-11-06: 2 mg via INTRAVENOUS

## 2023-11-06 MED ORDER — DEXMEDETOMIDINE HCL IN NACL 80 MCG/20ML IV SOLN
INTRAVENOUS | Status: DC | PRN
  Administered 2023-11-06: 6 ug via INTRAVENOUS
  Administered 2023-11-06: 8 ug via INTRAVENOUS
  Administered 2023-11-06: 6 ug via INTRAVENOUS

## 2023-11-06 MED ORDER — ONDANSETRON HCL 4 MG/2ML IJ SOLN
INTRAMUSCULAR | Status: DC | PRN
  Administered 2023-11-06: 4 mg via INTRAVENOUS

## 2023-11-06 MED ORDER — LIDOCAINE 2% (20 MG/ML) 5 ML SYRINGE
INTRAMUSCULAR | Status: DC | PRN
Start: 2023-11-06 — End: 2023-11-06
  Administered 2023-11-06: 60 mg via INTRAVENOUS

## 2023-11-06 MED ORDER — SCOPOLAMINE 1 MG/3DAYS TD PT72
MEDICATED_PATCH | TRANSDERMAL | Status: AC
  Filled 2023-11-06: qty 1

## 2023-11-06 MED ORDER — PROPOFOL 500 MG/50ML IV EMUL
INTRAVENOUS | Status: DC | PRN
  Administered 2023-11-06: 200 ug/kg/min via INTRAVENOUS

## 2023-11-06 MED ORDER — GABAPENTIN 100 MG PO CAPS
100.0000 mg | ORAL_CAPSULE | Freq: Two times a day (BID) | ORAL | Status: DC
  Administered 2023-11-06 (×2): 100 mg via ORAL
  Filled 2023-11-06 (×2): qty 1

## 2023-11-06 MED ORDER — ACETAMINOPHEN 500 MG PO TABS
ORAL_TABLET | ORAL | Status: AC
Start: 2023-11-06 — End: 2023-11-06
  Filled 2023-11-06: qty 2

## 2023-11-06 MED ORDER — CELECOXIB 200 MG PO CAPS
ORAL_CAPSULE | ORAL | Status: AC
  Filled 2023-11-06: qty 1

## 2023-11-06 MED ORDER — FENTANYL CITRATE (PF) 250 MCG/5ML IJ SOLN
INTRAMUSCULAR | Status: DC | PRN
Start: 2023-11-06 — End: 2023-11-06
  Administered 2023-11-06 (×3): 50 ug via INTRAVENOUS

## 2023-11-06 MED ORDER — FENTANYL CITRATE (PF) 100 MCG/2ML IJ SOLN
25.0000 ug | INTRAMUSCULAR | Status: DC | PRN
  Administered 2023-11-06 (×2): 50 ug via INTRAVENOUS

## 2023-11-06 MED ORDER — BUPIVACAINE HCL (PF) 0.25 % IJ SOLN
INTRAMUSCULAR | Status: AC
  Filled 2023-11-06: qty 30

## 2023-11-06 MED ORDER — ACETAMINOPHEN 325 MG PO TABS
650.0000 mg | ORAL_TABLET | Freq: Four times a day (QID) | ORAL | Status: DC | PRN
  Administered 2023-11-06: 650 mg via ORAL
  Filled 2023-11-06: qty 2

## 2023-11-06 MED ORDER — SCOPOLAMINE 1 MG/3DAYS TD PT72
1.0000 | MEDICATED_PATCH | TRANSDERMAL | Status: DC
  Administered 2023-11-06: 1.5 mg via TRANSDERMAL

## 2023-11-06 MED ORDER — CELECOXIB 200 MG PO CAPS
200.0000 mg | ORAL_CAPSULE | Freq: Once | ORAL | Status: AC
  Administered 2023-11-06: 200 mg via ORAL

## 2023-11-06 MED ORDER — MENTHOL 3 MG MT LOZG: 1.0000 | LOZENGE | OROMUCOSAL | Status: DC | PRN

## 2023-11-06 MED ORDER — TEMAZEPAM 15 MG PO CAPS
30.0000 mg | ORAL_CAPSULE | Freq: Every evening | ORAL | Status: DC | PRN
  Administered 2023-11-06: 30 mg via ORAL
  Filled 2023-11-06: qty 2

## 2023-11-06 MED ORDER — DEXAMETHASONE SODIUM PHOSPHATE 10 MG/ML IJ SOLN
INTRAMUSCULAR | Status: DC | PRN
  Administered 2023-11-06: 10 mg via INTRAVENOUS

## 2023-11-06 MED ORDER — SODIUM CHLORIDE 0.9 % IV SOLN
2.0000 g | INTRAVENOUS | Status: AC
  Administered 2023-11-06: 2 g via INTRAVENOUS
  Filled 2023-11-06: qty 2

## 2023-11-06 MED ORDER — ONDANSETRON HCL 4 MG/2ML IJ SOLN
INTRAMUSCULAR | Status: AC
  Filled 2023-11-06: qty 2

## 2023-11-06 MED ORDER — SUGAMMADEX SODIUM 200 MG/2ML IV SOLN
INTRAVENOUS | Status: DC | PRN
  Administered 2023-11-06: 200 mg via INTRAVENOUS

## 2023-11-06 MED ORDER — ROCURONIUM BROMIDE 10 MG/ML (PF) SYRINGE
PREFILLED_SYRINGE | INTRAVENOUS | Status: DC | PRN
  Administered 2023-11-06: 60 mg via INTRAVENOUS
  Administered 2023-11-06 (×2): 5 mg via INTRAVENOUS
  Administered 2023-11-06: 10 mg via INTRAVENOUS

## 2023-11-06 MED ORDER — HYDROMORPHONE HCL 2 MG PO TABS
2.0000 mg | ORAL_TABLET | ORAL | Status: DC | PRN
  Administered 2023-11-06 – 2023-11-07 (×2): 2 mg via ORAL
  Filled 2023-11-06 (×2): qty 1

## 2023-11-06 MED ORDER — PROPOFOL 10 MG/ML IV BOLUS
INTRAVENOUS | Status: AC
  Filled 2023-11-06: qty 20

## 2023-11-06 SURGICAL SUPPLY — 59 items
APPLICATOR ARISTA FLEXITIP XL (MISCELLANEOUS) ×1 IMPLANT
BAG COUNTER SPONGE SURGICOUNT (BAG) ×1 IMPLANT
BARRIER ADHS 3X4 INTERCEED (GAUZE/BANDAGES/DRESSINGS) IMPLANT
BENZOIN TINCTURE PRP APPL 2/3 (GAUZE/BANDAGES/DRESSINGS) ×1 IMPLANT
CABLE HIGH FREQUENCY MONO STRZ (ELECTRODE) IMPLANT
CANISTER SUCT 3000ML PPV (MISCELLANEOUS) ×2 IMPLANT
COVER BACK TABLE 60X90IN (DRAPES) ×2 IMPLANT
COVER MAYO STAND STRL (DRAPES) ×2 IMPLANT
DERMABOND ADVANCED .7 DNX12 (GAUZE/BANDAGES/DRESSINGS) ×2 IMPLANT
DRAPE CESAREAN BIRTH W POUCH (DRAPES) ×1 IMPLANT
DRAPE SURG IRRIG POUCH 19X23 (DRAPES) ×4 IMPLANT
DRAPE WARM FLUID 44X44 (DRAPES) IMPLANT
DRSG OPSITE POSTOP 3X4 (GAUZE/BANDAGES/DRESSINGS) IMPLANT
DRSG OPSITE POSTOP 4X10 (GAUZE/BANDAGES/DRESSINGS) ×1 IMPLANT
DURAPREP 26ML APPLICATOR (WOUND CARE) ×2 IMPLANT
ELECTRODE REM PT RTRN 9FT ADLT (ELECTROSURGICAL) ×2 IMPLANT
GAUZE 4X4 16PLY ~~LOC~~+RFID DBL (SPONGE) ×3 IMPLANT
GLOVE BIO SURGEON STRL SZ 6.5 (GLOVE) ×4 IMPLANT
GLOVE BIOGEL PI IND STRL 6.5 (GLOVE) ×2 IMPLANT
GLOVE SURG UNDER POLY LF SZ7 (GLOVE) ×2 IMPLANT
GOWN STRL REUS W/ TWL LRG LVL3 (GOWN DISPOSABLE) ×3 IMPLANT
HEMOSTAT ARISTA ABSORB 3G PWDR (HEMOSTASIS) ×1 IMPLANT
HIBICLENS CHG 4% 4OZ BTL (MISCELLANEOUS) ×3 IMPLANT
IRRIGATION SUCT STRKRFLW 2 WTP (MISCELLANEOUS) IMPLANT
KIT PINK PAD W/HEAD ARE REST (MISCELLANEOUS) ×2 IMPLANT
KIT PINK PAD W/HEAD ARM REST (MISCELLANEOUS) ×1 IMPLANT
KIT TURNOVER KIT B (KITS) ×2 IMPLANT
LEGGING LITHOTOMY PAIR STRL (DRAPES) ×2 IMPLANT
NDL HYPO 22X1.5 SAFETY MO (MISCELLANEOUS) ×1 IMPLANT
NDL INSUFFLATION 14GA 120MM (NEEDLE) ×1 IMPLANT
NEEDLE HYPO 22X1.5 SAFETY MO (MISCELLANEOUS) ×2 IMPLANT
NEEDLE INSUFFLATION 14GA 120MM (NEEDLE) ×2 IMPLANT
NS IRRIG 1000ML POUR BTL (IV SOLUTION) ×2 IMPLANT
PACK ABDOMINAL GYN (CUSTOM PROCEDURE TRAY) ×1 IMPLANT
PACK LAVH (CUSTOM PROCEDURE TRAY) ×2 IMPLANT
PACK ROBOTIC GOWN (GOWN DISPOSABLE) ×2 IMPLANT
PAD OB MATERNITY 11 LF (PERSONAL CARE ITEMS) ×2 IMPLANT
SEALER TISSUE X1 CVD 45CM (MISCELLANEOUS) ×1 IMPLANT
SET TUBE SMOKE EVAC HIGH FLOW (TUBING) ×2 IMPLANT
SPIKE FLUID TRANSFER (MISCELLANEOUS) IMPLANT
SPONGE T-LAP 18X18 ~~LOC~~+RFID (SPONGE) IMPLANT
STRIP CLOSURE SKIN 1/2X4 (GAUZE/BANDAGES/DRESSINGS) ×1 IMPLANT
SUT PDS AB 0 CT 36 (SUTURE) IMPLANT
SUT PDS AB 0 CTX 60 (SUTURE) IMPLANT
SUT PLAIN 2 0 XLH (SUTURE) ×1 IMPLANT
SUT VIC AB 0 CT1 18XCR BRD8 (SUTURE) ×5 IMPLANT
SUT VIC AB 0 CT1 27XBRD ANBCTR (SUTURE) ×4 IMPLANT
SUT VIC AB 0 CT1 36 (SUTURE) ×3 IMPLANT
SUT VIC AB 3-0 PS2 18XBRD (SUTURE) ×1 IMPLANT
SUT VIC AB 4-0 KS 27 (SUTURE) ×1 IMPLANT
SUT VICRYL 0 TIES 12 18 (SUTURE) ×1 IMPLANT
SUT VICRYL 0 UR6 27IN ABS (SUTURE) ×2 IMPLANT
SYR CONTROL 10ML LL (SYRINGE) ×2 IMPLANT
TOWEL GREEN STERILE FF (TOWEL DISPOSABLE) ×3 IMPLANT
TRAY FOLEY W/BAG SLVR 14FR (SET/KITS/TRAYS/PACK) ×2 IMPLANT
TROCAR XCEL DIL TIP R 11M (ENDOMECHANICALS) ×2 IMPLANT
TROCAR Z-THREAD BLADED 5X100MM (TROCAR) ×1 IMPLANT
UNDERPAD 30X36 HEAVY ABSORB (UNDERPADS AND DIAPERS) ×2 IMPLANT
WARMER LAPAROSCOPE (MISCELLANEOUS) ×2 IMPLANT

## 2023-11-06 NOTE — Progress Notes (Signed)
 H and P is on the chart.  No changes. BP 109/76   Pulse 80   Temp 98.1 F (36.7 C) (Oral)   Resp 16   Ht 5\' 3"  (1.6 m)   Wt 62.6 kg   LMP 10/25/2023 (Approximate)   SpO2 97%   BMI 24.45 kg/m  Results for orders placed or performed during the hospital encounter of 11/06/23 (from the past 24 hours)  Pregnancy, urine POC     Status: None   Collection Time: 11/06/23  5:48 AM  Result Value Ref Range   Preg Test, Ur NEGATIVE NEGATIVE   Will proceed with LAVH/BS/ possible TAH/BS History of c section x 3  No percocet or ibuprofen  post op  Consent is signed

## 2023-11-06 NOTE — Brief Op Note (Signed)
 11/06/2023  9:40 AM  PATIENT:  Sarah Stokes  38 y.o. female  PRE-OPERATIVE DIAGNOSIS:  MENORRHAGIA  POST-OPERATIVE DIAGNOSIS:  MENORRHAGIA  PROCEDURE:  Procedure(s): HYSTERECTOMY, VAGINAL, LAPAROSCOPY-ASSISTED, WITH SALPINGECTOMY (Bilateral)  SURGEON:  Surgeons and Role:    * Thurman Flores, MD - Primary    * Grace Laura, MD - Assisting  PHYSICIAN ASSISTANT:   ASSISTANTS: none   ANESTHESIA:   general  EBL:  50 mL   BLOOD ADMINISTERED:none  DRAINS: Urinary Catheter (Foley)   LOCAL MEDICATIONS USED:  MARCAINE      SPECIMEN:  Source of Specimen:  cervix, uterus and tubes   DISPOSITION OF SPECIMEN:  PATHOLOGY  COUNTS:  YES  TOURNIQUET:  * No tourniquets in log *  DICTATION: .Other Dictation: Dictation Number dictated  PLAN OF CARE: Admit for overnight observation  PATIENT DISPOSITION:  PACU - hemodynamically stable.   Delay start of Pharmacological VTE agent (>24hrs) due to surgical blood loss or risk of bleeding: not applicable

## 2023-11-06 NOTE — Anesthesia Postprocedure Evaluation (Signed)
 Anesthesia Post Note  Patient: Sarah Stokes  Procedure(s) Performed: HYSTERECTOMY, VAGINAL, LAPAROSCOPY-ASSISTED, WITH SALPINGECTOMY (Bilateral: Pelvis)     Patient location during evaluation: PACU Anesthesia Type: General Level of consciousness: awake and alert Pain management: pain level controlled Vital Signs Assessment: post-procedure vital signs reviewed and stable Respiratory status: spontaneous breathing, nonlabored ventilation, respiratory function stable and patient connected to nasal cannula oxygen Cardiovascular status: blood pressure returned to baseline and stable Postop Assessment: no apparent nausea or vomiting Anesthetic complications: no   No notable events documented.  Last Vitals:  Vitals:   11/06/23 1120 11/06/23 1200  BP: 108/69 109/78  Pulse: (!) 50 (!) 48  Resp: 14 16  Temp:    SpO2: 100% 100%    Last Pain:  Vitals:   11/06/23 1209  TempSrc:   PainSc: 6                  Ellard Nan P Areana Kosanke

## 2023-11-06 NOTE — Transfer of Care (Signed)
 Immediate Anesthesia Transfer of Care Note  Patient: Sarah Stokes  Procedure(s) Performed: HYSTERECTOMY, VAGINAL, LAPAROSCOPY-ASSISTED, WITH SALPINGECTOMY (Bilateral: Pelvis)  Patient Location: PACU  Anesthesia Type:General  Level of Consciousness: sedated  Airway & Oxygen Therapy: Patient Spontanous Breathing  Post-op Assessment: Report given to RN  Post vital signs: Reviewed and stable  Last Vitals:  Vitals Value Taken Time  BP 102/64 11/06/23 0949  Temp    Pulse 69 11/06/23 0952  Resp 16 11/06/23 0952  SpO2 95 % 11/06/23 0952  Vitals shown include unfiled device data.  Last Pain:  Vitals:   11/06/23 0557  TempSrc: Oral  PainSc: 6       Patients Stated Pain Goal: 3 (11/06/23 0557)  Complications: No notable events documented.

## 2023-11-06 NOTE — Plan of Care (Signed)

## 2023-11-06 NOTE — Anesthesia Procedure Notes (Signed)
 Procedure Name: Intubation Date/Time: 11/06/2023 7:46 AM  Performed by: Raylene Calamity, CRNAPre-anesthesia Checklist: Patient identified, Emergency Drugs available, Suction available and Patient being monitored Patient Re-evaluated:Patient Re-evaluated prior to induction Oxygen Delivery Method: Circle System Utilized Preoxygenation: Pre-oxygenation with 100% oxygen Induction Type: IV induction Ventilation: Mask ventilation without difficulty Laryngoscope Size: Mac and 3 Grade View: Grade I Tube type: Oral Tube size: 7.0 mm Number of attempts: 1 Airway Equipment and Method: Stylet and Oral airway Placement Confirmation: ETT inserted through vocal cords under direct vision, positive ETCO2 and breath sounds checked- equal and bilateral Secured at: 22 cm Tube secured with: Tape Dental Injury: Teeth and Oropharynx as per pre-operative assessment

## 2023-11-06 NOTE — Progress Notes (Signed)
 POD # 0  Doing well. Pain 6/10. Already voided. BP 109/78 (BP Location: Right Arm)   Pulse (!) 48   Temp 97.7 F (36.5 C)   Resp 16   Ht 5\' 3"  (1.6 m)   Wt 62.6 kg   LMP 10/25/2023 (Approximate)   SpO2 100%   BMI 24.45 kg/m  Results for orders placed or performed during the hospital encounter of 11/06/23 (from the past 24 hours)  Pregnancy, urine POC     Status: None   Collection Time: 11/06/23  5:48 AM  Result Value Ref Range   Preg Test, Ur NEGATIVE NEGATIVE   Abdomen is soft flat and non tender Incision is clean and dry and intact  POD # 0  Doing well Routine care  Discharge home tomorrow am

## 2023-11-07 ENCOUNTER — Encounter (HOSPITAL_COMMUNITY): Payer: Self-pay | Admitting: Obstetrics and Gynecology

## 2023-11-07 DIAGNOSIS — N920 Excessive and frequent menstruation with regular cycle: Secondary | ICD-10-CM | POA: Diagnosis not present

## 2023-11-07 LAB — CBC
HCT: 31.2 % — ABNORMAL LOW (ref 36.0–46.0)
Hemoglobin: 10.2 g/dL — ABNORMAL LOW (ref 12.0–15.0)
MCH: 27.9 pg (ref 26.0–34.0)
MCHC: 32.7 g/dL (ref 30.0–36.0)
MCV: 85.5 fL (ref 80.0–100.0)
Platelets: 248 10*3/uL (ref 150–400)
RBC: 3.65 MIL/uL — ABNORMAL LOW (ref 3.87–5.11)
RDW: 12.5 % (ref 11.5–15.5)
WBC: 13.8 10*3/uL — ABNORMAL HIGH (ref 4.0–10.5)
nRBC: 0 % (ref 0.0–0.2)

## 2023-11-07 LAB — SURGICAL PATHOLOGY

## 2023-11-07 MED ORDER — TRAMADOL HCL 50 MG PO TABS: 50.0000 mg | ORAL_TABLET | Freq: Four times a day (QID) | ORAL | 0 refills | Status: AC | PRN

## 2023-11-07 NOTE — Discharge Summary (Signed)
 Admission Note: Menorrhagia  Dysmenorrhea   Discharge Diagnosis: Same  Hospital Course: 38 year old female admitted with menorrhagia, dysmenorrhea. She Underwent uncomplicated LAVH/BS. She did very well post op. By POD # 1 she was ambulating and voiding. She had good pain control with tylenol  and Tramadol .   By POD 1 she was tolerating regular diet and had stable and appropriate hemoglobin.  BP (!) 99/59   Pulse 70   Temp 98.3 F (36.8 C) (Oral)   Resp 18   Ht 5\' 3"  (1.6 m)   Wt 62.6 kg   LMP 10/25/2023 (Approximate)   SpO2 99%   BMI 24.45 kg/m  Results for orders placed or performed during the hospital encounter of 11/06/23 (from the past 24 hours)  CBC     Status: Abnormal   Collection Time: 11/07/23  4:54 AM  Result Value Ref Range   WBC 13.8 (H) 4.0 - 10.5 K/uL   RBC 3.65 (L) 3.87 - 5.11 MIL/uL   Hemoglobin 10.2 (L) 12.0 - 15.0 g/dL   HCT 95.2 (L) 84.1 - 32.4 %   MCV 85.5 80.0 - 100.0 fL   MCH 27.9 26.0 - 34.0 pg   MCHC 32.7 30.0 - 36.0 g/dL   RDW 40.1 02.7 - 25.3 %   Platelets 248 150 - 400 K/uL   nRBC 0.0 0.0 - 0.2 %   Abdomen soft and non tender Incisions are clean and dry and intact   Discharged home in good condition on POD # 1  Follow up in one week. Given strict discharge precautions.

## 2023-11-11 NOTE — Op Note (Signed)
 Sarah Stokes, Sarah Stokes MEDICAL RECORD NO: [id number] ACCOUNT NO: [id number] DATE OF BIRTH: 1986/01/16 FACILITY: [facility] LOCATION: [facility] PHYSICIAN: Remonia Otte L. Serge Dancer, MD  Operative Report   DATE OF PROCEDURE: 11/06/2023  PREOPERATIVE DIAGNOSIS:  Menorrhagia.  POSTOPERATIVE DIAGNOSIS:  Menorrhagia.  PROCEDURE:  Laparoscopic-assisted vaginal hysterectomy with bilateral salpingectomy.  SURGEON:  Letica Giaimo L. Serge Dancer, MD  ASSISTANT:  Dr. Lore Rode.  ESTIMATED BLOOD LOSS:  Per anesthesia record.  ANESTHESIA:  General.  PATHOLOGY:  Cervix, uterus, and fallopian tubes.  COMPLICATIONS:  None.  DRAINS:  Foley catheter.  DESCRIPTION OF PROCEDURE:  The patient was taken to the operating room.  She was intubated.  She was prepped and draped in the usual sterile fashion.  A Foley catheter was inserted.  A uterine manipulator was inserted.  Attention was turned to the  abdomen and local was infiltrated at the umbilicus and a small infraumbilical incision was made and a Veress needle was inserted.  The pneumoperitoneum was performed.  The Veress needle was then removed and an 11 mm trocar was inserted.  The scope was  introduced through the trocar sheath.  The patient was gently placed in the Trendelenburg position.  A small secondary suprapubic site was placed with a 5 mm trocar.  The pelvis appeared to be normal.  Her uterus was slightly enlarged.  There was some  scarring where she has had her prior cesarean section.  The ovaries appeared to be normal.  I used a long grasper, elevated the fallopian tube on the right, placed the EnSeal across the mesosalpinx, carried that down all the way to the triple pedicle and  then from the triple pedicle down to the round ligament with excellent hemostasis.  This was done on the right and on the left with excellent hemostasis.  After we did both sides and released the tubes, we then went down to the vagina.  We made a  circumferential incision around the  cervix.  The posterior and anterior cul-de-sacs were entered using Metzenbaum scissors.  Curve Heaney clamps were placed across the uterosacral cardinal ligaments on either side.  Each pedicle was clamped, cut, and  suture ligated using 0 Vicryl suture.  We walked our way up the broad ligament and each pedicle was clamped, cut, and suture ligated using 0 Vicryl suture.  We removed the specimen, identified the cervix, uterus, and fallopian tubes, then closed the  posterior vaginal cuff using running lock stitch and then closed the cuff from anterior to posterior in a running lock stitch.  We then went back up to the abdomen and irrigated the pelvis.  All pedicles were hemostatic.  I then placed some Arista across  the pedicles and the vaginal cuff.  The gas was released.  The trocars were removed.  The incisions were closed using 0 Vicryl and the skin was closed with 3-0 Vicryl.  Urine output was clear.  The patient was extubated after all sponge, lap, and  instrument counts were correct x2.  The patient went to the recovery room in stable condition.   VAI D: 11/10/2023 2:29:01 pm T: 11/11/2023 12:30:00 am  JOB: [id number]/ [id number]

## 2024-05-25 ENCOUNTER — Telehealth: Admitting: Physician Assistant

## 2024-05-25 DIAGNOSIS — J019 Acute sinusitis, unspecified: Secondary | ICD-10-CM | POA: Diagnosis not present

## 2024-05-25 MED ORDER — FLUCONAZOLE 150 MG PO TABS: 150.0000 mg | ORAL_TABLET | Freq: Every day | ORAL | 0 refills | Status: AC

## 2024-05-25 MED ORDER — AMOXICILLIN-POT CLAVULANATE 875-125 MG PO TABS: 1.0000 | ORAL_TABLET | Freq: Two times a day (BID) | ORAL | 0 refills | Status: AC

## 2024-05-25 NOTE — Progress Notes (Signed)
 E-Visit for Sinus Problems  We are sorry that you are not feeling well.  Here is how we plan to help!  Based on what you have shared with me it looks like you have sinusitis.  Sinusitis is inflammation and infection in the sinus cavities of the head.  Based on your presentation I believe you most likely have Acute Bacterial Sinusitis.  This is an infection caused by bacteria and is treated with antibiotics. I have prescribed Augmentin  875mg /125mg  one tablet twice daily with food, for 7 days. You may use an oral decongestant such as Mucinex D or if you have glaucoma or high blood pressure use plain Mucinex. Saline nasal spray help and can safely be used as often as needed for congestion.  If you develop worsening sinus pain, fever or notice severe headache and vision changes, or if symptoms are not better after completion of antibiotic, please schedule an appointment with a health care provider.    I have also sent in diflucan  for yeast infection.  Please only take this if you develop vaginal discharge and itching.   Sinus infections are not as easily transmitted as other respiratory infection, however we still recommend that you avoid close contact with loved ones, especially the very young and elderly.  Remember to wash your hands thoroughly throughout the day as this is the number one way to prevent the spread of infection!  Home Care: Only take medications as instructed by your medical team. Complete the entire course of an antibiotic. Do not take these medications with alcohol. A steam or ultrasonic humidifier can help congestion.  You can place a towel over your head and breathe in the steam from hot water coming from a faucet. Avoid close contacts especially the very young and the elderly. Cover your mouth when you cough or sneeze. Always remember to wash your hands.  Get Help Right Away If: You develop worsening fever or sinus pain. You develop a severe head ache or visual  changes. Your symptoms persist after you have completed your treatment plan.  Make sure you Understand these instructions. Will watch your condition. Will get help right away if you are not doing well or get worse.  Your e-visit answers were reviewed by a board certified advanced clinical practitioner to complete your personal care plan.  Depending on the condition, your plan could have included both over the counter or prescription medications.  If there is a problem please reply  once you have received a response from your provider.  Your safety is important to us .  If you have drug allergies check your prescription carefully.    You can use MyChart to ask questions about today's visit, request a non-urgent call back, or ask for a work or school excuse for 24 hours related to this e-Visit. If it has been greater than 24 hours you will need to follow up with your provider, or enter a new e-Visit to address those concerns.  You will get an e-mail in the next two days asking about your experience.  I hope that your e-visit has been valuable and will speed your recovery. Thank you for using e-visits.  I have spent 5 minutes in review of e-visit questionnaire, review and updating patient chart, medical decision making and response to patient.   Harlene PEDLAR Ward, PA-C
